# Patient Record
Sex: Male | Born: 1950
Health system: Southern US, Community
[De-identification: ages and names within clinical notes are randomized; demographics above are authoritative.]

## PROBLEM LIST (undated history)

## (undated) DIAGNOSIS — Z8719 Personal history of other diseases of the digestive system: Secondary | ICD-10-CM

## (undated) DIAGNOSIS — I10 Essential (primary) hypertension: Secondary | ICD-10-CM

## (undated) DIAGNOSIS — I259 Chronic ischemic heart disease, unspecified: Secondary | ICD-10-CM

## (undated) DIAGNOSIS — N049 Nephrotic syndrome with unspecified morphologic changes: Secondary | ICD-10-CM

## (undated) DIAGNOSIS — I252 Old myocardial infarction: Secondary | ICD-10-CM

## (undated) DIAGNOSIS — T4145XA Adverse effect of unspecified anesthetic, initial encounter: Secondary | ICD-10-CM

## (undated) DIAGNOSIS — G4730 Sleep apnea, unspecified: Secondary | ICD-10-CM

## (undated) DIAGNOSIS — I2510 Atherosclerotic heart disease of native coronary artery without angina pectoris: Principal | ICD-10-CM

## (undated) DIAGNOSIS — T8859XA Other complications of anesthesia, initial encounter: Secondary | ICD-10-CM

## (undated) DIAGNOSIS — K219 Gastro-esophageal reflux disease without esophagitis: Secondary | ICD-10-CM

## (undated) DIAGNOSIS — E7800 Pure hypercholesterolemia, unspecified: Secondary | ICD-10-CM

## (undated) DIAGNOSIS — E119 Type 2 diabetes mellitus without complications: Secondary | ICD-10-CM

## (undated) DIAGNOSIS — I219 Acute myocardial infarction, unspecified: Secondary | ICD-10-CM

## (undated) HISTORY — DX: Essential (primary) hypertension: I10

## (undated) HISTORY — DX: Chronic ischemic heart disease, unspecified: I25.9

## (undated) HISTORY — PX: BACK SURGERY: SHX140

## (undated) HISTORY — PX: EYE SURGERY: SHX253

## (undated) HISTORY — DX: Pure hypercholesterolemia, unspecified: E78.00

## (undated) HISTORY — DX: Atherosclerotic heart disease of native coronary artery without angina pectoris: I25.10

## (undated) HISTORY — DX: Old myocardial infarction: I25.2

---
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
## 1989-07-25 HISTORY — PX: LUMBAR DISC SURGERY: SHX700

## 2001-02-20 ENCOUNTER — Ambulatory Visit (HOSPITAL_BASED_OUTPATIENT_CLINIC_OR_DEPARTMENT_OTHER): Admission: RE | Admit: 2001-02-20 | Discharge: 2001-02-20 | Payer: Self-pay | Admitting: Otolaryngology

## 2001-02-20 ENCOUNTER — Encounter (INDEPENDENT_AMBULATORY_CARE_PROVIDER_SITE_OTHER): Payer: Self-pay | Admitting: *Deleted

## 2001-02-20 HISTORY — PX: NASAL SEPTUM SURGERY: SHX37

## 2002-07-25 DIAGNOSIS — I219 Acute myocardial infarction, unspecified: Secondary | ICD-10-CM

## 2002-07-25 HISTORY — DX: Acute myocardial infarction, unspecified: I21.9

## 2002-09-25 ENCOUNTER — Encounter: Payer: Self-pay | Admitting: Emergency Medicine

## 2002-09-25 ENCOUNTER — Inpatient Hospital Stay (HOSPITAL_COMMUNITY): Admission: EM | Admit: 2002-09-25 | Discharge: 2002-09-28 | Payer: Self-pay | Admitting: Emergency Medicine

## 2002-09-25 HISTORY — PX: CORONARY ANGIOPLASTY WITH STENT PLACEMENT: SHX49

## 2002-11-11 ENCOUNTER — Emergency Department (HOSPITAL_COMMUNITY): Admission: EM | Admit: 2002-11-11 | Discharge: 2002-11-11 | Payer: Self-pay

## 2003-02-28 ENCOUNTER — Ambulatory Visit (HOSPITAL_COMMUNITY): Admission: RE | Admit: 2003-02-28 | Discharge: 2003-02-28 | Payer: Self-pay | Admitting: *Deleted

## 2004-12-13 ENCOUNTER — Ambulatory Visit: Payer: Self-pay | Admitting: Cardiovascular Disease

## 2005-07-21 ENCOUNTER — Ambulatory Visit: Payer: Self-pay | Admitting: *Deleted

## 2005-08-01 ENCOUNTER — Ambulatory Visit: Payer: Self-pay | Admitting: *Deleted

## 2010-06-24 ENCOUNTER — Ambulatory Visit: Payer: Self-pay | Admitting: Cardiology

## 2010-11-02 ENCOUNTER — Ambulatory Visit: Payer: Self-pay | Admitting: Cardiology

## 2010-11-15 ENCOUNTER — Other Ambulatory Visit: Payer: Self-pay | Admitting: *Deleted

## 2010-11-15 DIAGNOSIS — E7800 Pure hypercholesterolemia, unspecified: Secondary | ICD-10-CM

## 2010-11-16 ENCOUNTER — Ambulatory Visit: Payer: Self-pay | Admitting: Cardiology

## 2010-11-19 ENCOUNTER — Encounter: Payer: Self-pay | Admitting: *Deleted

## 2010-11-19 DIAGNOSIS — I2510 Atherosclerotic heart disease of native coronary artery without angina pectoris: Secondary | ICD-10-CM

## 2010-11-19 DIAGNOSIS — E7800 Pure hypercholesterolemia, unspecified: Secondary | ICD-10-CM

## 2010-11-19 DIAGNOSIS — E119 Type 2 diabetes mellitus without complications: Secondary | ICD-10-CM | POA: Insufficient documentation

## 2010-11-19 DIAGNOSIS — I10 Essential (primary) hypertension: Secondary | ICD-10-CM

## 2010-11-19 DIAGNOSIS — I259 Chronic ischemic heart disease, unspecified: Secondary | ICD-10-CM

## 2010-11-25 ENCOUNTER — Encounter: Payer: Self-pay | Admitting: Cardiology

## 2010-11-25 ENCOUNTER — Other Ambulatory Visit (INDEPENDENT_AMBULATORY_CARE_PROVIDER_SITE_OTHER): Payer: 59 | Admitting: Cardiology

## 2010-11-25 ENCOUNTER — Other Ambulatory Visit (INDEPENDENT_AMBULATORY_CARE_PROVIDER_SITE_OTHER): Payer: 59 | Admitting: *Deleted

## 2010-11-25 ENCOUNTER — Ambulatory Visit (INDEPENDENT_AMBULATORY_CARE_PROVIDER_SITE_OTHER): Payer: 59 | Admitting: Cardiology

## 2010-11-25 VITALS — BP 120/80 | HR 65 | Wt 176.0 lb

## 2010-11-25 DIAGNOSIS — E785 Hyperlipidemia, unspecified: Secondary | ICD-10-CM

## 2010-11-25 DIAGNOSIS — I10 Essential (primary) hypertension: Secondary | ICD-10-CM

## 2010-11-25 DIAGNOSIS — E7800 Pure hypercholesterolemia, unspecified: Secondary | ICD-10-CM

## 2010-11-25 DIAGNOSIS — I259 Chronic ischemic heart disease, unspecified: Secondary | ICD-10-CM

## 2010-11-25 DIAGNOSIS — I519 Heart disease, unspecified: Secondary | ICD-10-CM

## 2010-11-25 DIAGNOSIS — E119 Type 2 diabetes mellitus without complications: Secondary | ICD-10-CM

## 2010-11-25 LAB — HEPATIC FUNCTION PANEL
Albumin: 3.9 g/dL (ref 3.5–5.2)
Alkaline Phosphatase: 42 U/L (ref 39–117)
Bilirubin, Direct: 0 mg/dL (ref 0.0–0.3)

## 2010-11-25 LAB — BASIC METABOLIC PANEL
Calcium: 9.2 mg/dL (ref 8.4–10.5)
Creatinine, Ser: 1 mg/dL (ref 0.4–1.5)
GFR: 84.28 mL/min (ref >60.00)
Glucose, Bld: 169 mg/dL — ABNORMAL HIGH (ref 70–99)
Sodium: 134 mEq/L — ABNORMAL LOW (ref 135–145)

## 2010-11-25 LAB — LIPID PANEL
HDL: 41.2 mg/dL (ref >39.00)
Triglycerides: 368 mg/dL — ABNORMAL HIGH (ref 0.0–149.0)
VLDL: 73.6 mg/dL — ABNORMAL HIGH (ref 0.0–40.0)

## 2010-11-25 NOTE — Progress Notes (Signed)
Eric Ball Date of Birth:  1951-05-29 [facility] / [facility] [address]   [address] [place], [place]  [address] [phone]           Fax   [phone]  History of Present Illness: This pleasant 60 year old gentleman is seen for a four-month followup office visit.  He has a history of coronary disease hypertension and diabetes and hypercholesterolemia.  He had an acute myocardial infarction in 2004 with a stent placed at that time.  He has not been expressing any recurrent chest pain.  His last nuclear stress test was 10/01/07 at which time he went 8 minutes on the Bruce protocol treadmill and is nuclear stress test showed no evidence of ischemia or scar and his ejection fraction was 70%.  The patient has not been getting any regular exercise.  Previously [facility] a member of [place] [employer] But has not been going to the [employer] Recently.  Current Outpatient Prescriptions  Medication Sig Dispense Refill  . aspirin 81 MG tablet Take 81 mg by mouth daily.        . carvedilol (COREG) 12.5 MG tablet Take 12.5 mg by mouth 2 (two) times daily.        Marland Kitchen ezetimibe (ZETIA) 10 MG tablet Take 10 mg by mouth daily.        . metFORMIN (GLUMETZA) 1000 MG (MOD) 24 hr tablet Take 1,000 mg by mouth daily with breakfast.        . omega-3 acid ethyl esters (LOVAZA) 1 G capsule Take 2 g by mouth 2 (two) times daily.        . Omeprazole Magnesium (PRILOSEC OTC PO) Take 1 capsule by mouth daily. Taking  Every other day      . valsartan (DIOVAN) 160 MG tablet Take 160 mg by mouth daily.          Allergies  Allergen Reactions  . Beta Adrenergic Blockers     Patient Active Problem List  Diagnoses  . Coronary artery disease  . Diabetes mellitus  . Hypercholesterolemia  . Ischemic heart disease  . Hypertension    History  Smoking status  . Never Smoker   Smokeless tobacco  . Not on file    History  Alcohol Use No    Family History  Problem Relation Age of Onset  . Coronary artery disease  Father     with multiple angioplasties  . Hypertension      runs in the family    Review of Systems: Constitutional: no fever chills diaphoresis or fatigue or change in weight.  Head and neck: no hearing loss, no epistaxis, no photophobia or visual disturbance. Respiratory: No cough, shortness of breath or wheezing. Cardiovascular: No chest pain peripheral edema, palpitations. Gastrointestinal: No abdominal distention, no abdominal pain, no change in bowel habits hematochezia or melena. Genitourinary: No dysuria, no frequency, no urgency, no nocturia. Musculoskeletal:No arthralgias, no back pain, no gait disturbance or myalgias. Neurological: No dizziness, no headaches, no numbness, no seizures, no syncope, no weakness, no tremors. Hematologic: No lymphadenopathy, no easy bruising. Psychiatric: No confusion, no hallucinations, no sleep disturbance.    Physical Exam: Filed Vitals:   11/25/10 0925  BP: 120/80  Pulse: 65  The general appearance reveals a well-developed well-nourished gentleman in no distress.Pupils equal and reactive.   Extraocular Movements are full.  There is no scleral icterus.  The mouth and pharynx are normal.  The neck is supple.  The carotids reveal no bruits.  The jugular  venous pressure is normal.  The thyroid is not enlarged.  There is no lymphadenopathy.The chest is clear to percussion and auscultation. There are no rales or rhonchi. Expansion of the chest is symmetrical.The precordium is quiet.  The first heart sound is normal.  The second heart sound is physiologically split.  There is no murmur gallop rub or click.  There is no abnormal lift or heave.The abdomen is soft and nontender. Bowel sounds are normal. The liver and spleen are not enlarged. There Are no abdominal masses. There are no bruits.The pedal pulses are good.  There is no phlebitis or edema.  There is no cyanosis or clubbing.Strength is normal and symmetrical in all extremities.  There is no  lateralizing weakness.  There are no sensory deficits.The skin is warm and dry.  There is no rash.No muscle weakness or atrophy.  No muscle tenderness.  EKG today shows normal sinus rhythm at 65 per minute and no ischemic changes and the tracing is within normal limits.  Assessment / Plan: Continue on same medication.  Work harder on regular exercise.  Try to eat 3 balanced meals a day and not skip breakfast to help his diabetes better.  Recheck in 4 months for followup office visit including glycohemoglobin

## 2010-11-25 NOTE — Assessment & Plan Note (Signed)
The patient has not been on any recurrent chest pain or angina.  His electrocardiogram today shows normal sinus rhythm and is within normal limits.

## 2010-11-25 NOTE — Assessment & Plan Note (Signed)
The patient is intolerant to all of the statins.  He is able to take Lovaza and Zetia

## 2010-11-25 NOTE — Assessment & Plan Note (Signed)
The patient has gained 1 pound since last visit.  However recently he came back from a cruise to the [place] where he put on some weight.  He has not been having any hypoglycemic episodes.  He's frequently skips breakfast.

## 2010-11-25 NOTE — Assessment & Plan Note (Signed)
The patient has a past history of essential hypertension.  He is on Diovan 160 mg daily and carvedilol 12.5 mg twice a day.  He has not been expressing any dizzy spells or syncope.  He does complain of fatigue but has not been getting any regular exercise.

## 2010-11-26 ENCOUNTER — Telehealth: Payer: Self-pay | Admitting: *Deleted

## 2010-11-26 NOTE — Telephone Encounter (Signed)
Adv pt of lab work

## 2010-12-10 NOTE — Discharge Summary (Signed)
NAME:  Eric Ball, Eric Ball NO.:  [id number]   MEDICAL RECORD NO.:  [id number]                   PATIENT TYPE:  INP   LOCATION:  2030                                 FACILITY:  [facility]   PHYSICIAN:  Dian Queen, P.A. [facility]               DATE OF BIRTH:  [birth year]   DATE OF ADMISSION:  09/25/2002  DATE OF DISCHARGE:                           DISCHARGE SUMMARY - REFERRING   HISTORY OF PRESENT ILLNESS:  The patient is a 60 year old white gentleman  with history of hypertension and hyperlipidemia, who is intolerant to  Lipitor and who presented with several day history of intermittent chest  discomfort initially associated with mild exertion. Symptoms seemed to wax  and wane and it was felt that this perhaps might be reflux. However, with  increasing activity, he had progressive symptoms and ultimately was referred  to [place] for further evaluation and therapy. He does have a significant family  history. His father had coronary artery disease with multiple interventions.   LABORATORY DATA:  Hemoglobin and hematocrit 13.2 and 37.8, white count  11,300. Electrolytes and renal function totally within normal limits. CK  peaked at  264 with MB of 26.7 and troponin of 2.78. Cholesterol was 341  with triglycerides of 433, HDL of 52 and LDL was incalculable because of the  high triglycerides. A repeat lipid profile was done which revealed total  cholesterol of 264 with triglyceride of 323 and an LDL of 159 with an HDL of  40.   HOSPITAL COURSE:  The patient was ruled in with elevated enzymes as above.  He was cathed on the same day of admission by Dr. Chales Abrahams for severe ongoing  chest pain and found to have a 99% AV circumflex which was dilated and  stented without complications using a 3.0 x 16 Express II Stent. He did have  some scattered 30% lesions in his LAD and right coronary systems. Ejection  fraction was slightly reduced at 45% with some lateral wall hypokinesis.  Thereafter, the patient did well. He was begun on Zocor 40 mg each day as  well as beta blockade therapy. He had some oozing from his groin but  ultimately, this stabilized. At the time of discharge, he is fully  ambulatory and doing well.   FINAL DIAGNOSES:  1. Acute circumflex infarct, treated with urgent intervention as above.  2. Hyperlipidemia.  3. History of known intolerance to Lipitor.  4. Controlled hypertension.  5. Family history of coronary disease.    DISPOSITION:  The patient is discharged to home on Zocor 40 mg each day,  Toprol XL 50 mg each day, Altace 5 mg each day, aspirin one daily, Nexium as  needed, Nitroglycerin as needed, and Plavix 75 mg by mouth each day.   FOLLOW UP:  With Dr. Dorethea Clan in [place].  Dian Queen, P.A. [facility]    BY/MEDQ  D:  09/28/2002  T:  09/29/2002  Job:  [id number]   cc:   Madelin Rear. Sherwood Gambler, M.D.  [address]  [place]  [place] [address]  Fax: [phone]   Vida Roller, M.D.  Fax: [phone]

## 2010-12-10 NOTE — Procedures (Signed)
   NAME:  Eric Ball, Eric Ball NO.:  [id number]   MEDICAL RECORD NO.:  [id number]                   PATIENT TYPE:  OUT   LOCATION:  RAD                                  FACILITY:  [facility]   PHYSICIAN:  Vida Roller, M.D.                DATE OF BIRTH:  [birth year]   DATE OF PROCEDURE:  02/28/2003  DATE OF DISCHARGE:                                  ECHOCARDIOGRAM   REFERRING PHYSICIAN:  Madelin Rear. Sherwood Gambler, M.D.   PROCEDURE:  Echocardiogram.   TAPE:  #[id number], tape count is 1605 through 1993.   REASON FOR PROCEDURE:  This is a 60 year old man with myocardial infarction  in March.   TECHNICAL QUALITY OF STUDY:  Limited.   M-MODE TRACINGS:  1. The aorta is 33 mm.  2. The left atrium is 37 mm.  3. The septum is 13 mm.  4. The posterior wall is 10 mm.  5. The left ventricular diastolic dimension is 35 mm.  6. The left ventricular systolic dimension is 26 mm.   2-D AND DOPPLER IMAGING:  1. The left ventricle is normal size with preserved left ventricular     ejection fraction estimated at 50%-55%.  There is inferolateral     hypokinesis with the remainder of the walls moving normally.  Diastolic     function was not assessed.  2. The right ventricle is normal size with normal systolic function.  3. Both atria appear to be normal size.  There is no evidence of an     atrioseptal defect.  4. The aortic valve is mildly sclerotic with no evidence of stenosis or     regurgitation.  5. The mitral valve is morphologically unremarkable with trace to mild     insufficiency.  No stenosis is seen.  6. The tricuspid valve is morphologically unremarkable with trace to mild     insufficiency.  No stenosis is seen.  7. The pulmonic valve was not well seen.  8. The ascending aorta appears to be mildly enlarged although not well     visualized in this view.  9. The inferior vena cava is normal size.  10.      The pericardial structures are normal.                              Vida Roller, M.D.   JH/MEDQ  D:  02/28/2003  T:  02/28/2003  Job:  [id number]

## 2010-12-10 NOTE — Cardiovascular Report (Signed)
NAME:  Eric Ball, Eric Ball NO.:  [id number]   MEDICAL RECORD NO.:  [id number]                   PATIENT TYPE:  INP   LOCATION:  1823                                 FACILITY:  [facility]   PHYSICIAN:  Veneda Melter, M.D. [facility]               DATE OF BIRTH:  1950-11-25   DATE OF PROCEDURE:  09/25/2002  DATE OF DISCHARGE:                              CARDIAC CATHETERIZATION   PROCEDURES PERFORMED:  1. Left heart catheterization.  2. Left ventriculogram.  3. Selective coronary angiography.  4. Primary stent placement, mid arteriovenous circumflex.  5. Perclose, right femoral artery.   DIAGNOSES:  1. Severe single-vessel coronary artery disease.  2. Mild left ventricular systolic dysfunction.  3. Acute lateral wall myocardial infarction.   HISTORY:  The patient is a 60 year old gentleman with no prior cardiac  history who presents with three days of intermittent substernal chest  discomfort.  This morning, the patient had severe onset of pain that was  unrelenting and he presented to the emergency room where he was found to  have transient ST elevation in the lateral leads.  He is transferred to the  catheterization lab for urgent angiography.   TECHNIQUE:  Informed consent was obtained, patient brought to the cardiac  catheterization lab, and a 7 French arterial and 6 French venous sheath was  placed in the right groin using the modified Seldinger technique.  A 6  [place] and JR4 catheter was then used to engage the left and right  coronary arteries and selective angiography performed in various projections  using manual injections of contrast.  A 6 French pigtail catheter was then  advanced to the left ventricle and left ventriculogram performed using power  injections of contrast in two views.   FINDINGS:  Initial findings are as follows:  1. Left main trunk:  Large caliber vessel with mild irregularities.  2. LAD:  This is a medium caliber vessel that  provides a bifurcating first     diagonal branch in the mid section.  The LAD has mild diffuse disease of     30% in the proximal segment and tubular narrowings of 40-50% in the mid     section.  The diagonal branch has moderate diffuse disease of 50-60%.  3. Left circumflex artery:  This is a medium caliber vessel that provides     two marginal branches in the mid section.  The left circumflex system has     narrowings of 30% in the proximal segment.  In the mid AV circumflex     there is a high-grade narrowing of 99% prior to the takeoff of the two     marginal branches.  There is TIMI-2 flow distally.  4. RCA:  Dominant.  This is a large caliber vessel that provides the     posterior descending artery and a posterior ventricular branch in the     terminal  segment.  The right coronary has mild diffuse disease of 30% in     the mid section.   LEFT VENTRICULOGRAM:  Normal end-systolic and end-diastolic dimensions.  Overall, left ventricular function appears mildly impaired with an ejection  fraction of approximately 45%.  There is severe hypokinesis of the lateral  and atrial lateral walls.  No mitral regurgitation is noted.  LV pressure is  130/10, aortic is 130/90, LVEDP equals 30.   With these findings, we elected to proceed with percutaneous intervention to  the patent circumflex.  The patient was given 600 mg of Plavix orally, 5000  units of heparin intravenously and ReoPro on a weight-adjusted basis to  maintain ACT of approximately 300 seconds. A  7 [place] guide catheter  was then used to engage the left coronary artery and a 0.014 extra-support  wire advanced to the distal section of the second marginal branch.  A 3.0 x  16 mm Express II stent was introduced, carefully positioned in the mid AV  circumflex and deployed at 16 atmospheres for 60 seconds.  Angiography was  then performed after the administration of intracoronary nitroglycerin  showing excellent result with no  residual stenosis, full coverage of lesion  and TIMI-3 flow through the left circumflex system.  Angiography was then  performed in various projections confirming these findings.  The patient had  complete resolution obtained at the end of the intervention.  The guide  catheter was then removed and  Perclose suture closure device deployed to  the right femoral artery where the sheath was secured in position.  The  patient was transferred to CCU in stable condition.   FINAL RESULTS:  Successful stent placement to the mid arteriovenous  circumflex with reduction of 99% narrowing to 0% with placement of a 3.0 x  16 mm Express II stent with improvement of TIMI grade 2 to TIMI grade 3  flow.                                                Veneda Melter, M.D. [facility]    NG/MEDQ  D:  09/25/2002  T:  09/25/2002  Job:  [id number]   cc:   Madelin Rear. Sherwood Gambler, M.D.  [address]  [place]  [place] [address]  Fax: [phone]   Vida Roller, M.D.  Fax: [phone]

## 2010-12-10 NOTE — Consult Note (Signed)
NAME:  Eric Ball, Eric Ball NO.:  [id number]   MEDICAL RECORD NO.:  [id number]                   PATIENT TYPE:  EMS   LOCATION:  ED                                   FACILITY[facility]   PHYSICIAN:  Veverly Fells. Arletha Grippe, M.D.             DATE OF BIRTH:  Aug 12, 1950   DATE OF CONSULTATION:  11/11/2002  DATE OF DISCHARGE:                                   CONSULTATION   REASON FOR CONSULTATION:  I was asked to see this 60 year old white male  with a history of having a myocardial infarction earlier this year,  requiring a stent placement.  He has been on Plavix and aspirin.  He has had  right-sided epistaxis on and off for the past week or two.  He was evaluated  by Dr. Hermelinda Medicus this afternoon in the office who did some silver  nitrate cautery and initial Merisel packing of the right nose, but he called  me about 6 p.m. this evening with acute right-sided epistaxis.  He was  instructed to go to the emergency room at [facility], and he is  here for evaluation.  Besides having recurrent epistaxis, he has been on  Plavix and aspirin and other supportive medications after his angioplasty.   PHYSICAL EXAMINATION:  GENERAL:  A well-nourished, well-developed, 51-year-  old white male, in no acute distress.  HEENT:  Head normocephalic, atraumatic.  Eyes:  Pupils equal, round,  reactive to light and accommodation.  Extraocular muscles intact.  Facial  nerve intact bilaterally.  Oral cavity:  Oral mucosa, teeth and gums without  lesions.  The posterior pharynx at this time is dry.  Nasal examination  shows some packing involving the right nasal chamber.  This was removed and  topical 4% cocaine on cotton pledgets was placed within the right nasal  chamber, which were left in place for approximately 5-10 minutes and then  removed.  A large 10 cm Merisel pack was placed soaked in bacitracin  ointment in the right nasal chamber without difficulty.  It was  then  inflated with some saline without difficulty, with good control of the  bleeding.  A re-inspection showed no signs of active bleeding anteriorly or  posteriorly.  He tolerated the packing well, without problems.  NECK:  Supple, without mass, tenderness, or thyromegaly.   ASSESSMENT:  Acute right-sided epistaxis due to aspirin and Plavix use after  a myocardial infarction, controlled with a 10 cm Merisel pack in the  emergency department at [facility].   PLAN:  The patient will be discharged to home on Keflex 500 mg p.o. t.i.d.  for 10 days, and Vicodin #30, with one refill, one to two tab p.o. q.4h.  p.r.n. pain.  He is to have light activity and no heavy lifting or nose  blowing for two  weeks after surgery, and he is to instill salt water nasal spray  on the pack  four to six times per day.  He is to call Dr. Allayne Stack office in the  morning on November 12, 2002, for a return appointment in approximately five to  seven days for packing removal.                                                Veverly Fells. Arletha Grippe, M.D.    MDR/MEDQ  D:  11/11/2002  T:  11/12/2002  Job:  [id number]   cc:   Hermelinda Medicus, M.D.  [address]  [place] [address]  Fax: [phone]

## 2010-12-10 NOTE — Op Note (Signed)
[place]  Patient:    Eric Ball, Eric Ball                      MRN: [id number] Adm. Date:  [id number] Attending:  Waldon Merl CC:         [facility], [place], [place]   Operative Report  PREOPERATIVE DIAGNOSES:  Septal deviation and turbinate hypertrophy, with history of epistaxis.  POSTOPERATIVE DIAGNOSES:  Septal deviation and turbinate hypertrophy, with history of epistaxis.  PROCEDURES:  Septal reconstruction and turbinate reduction.  SURGEON:  Keturah Barre, M.D.  ANESTHESIA:  Local with general standby with Janetta Hora. Gelene Mink, M.D.  CLINICAL NOTE:  This patient is a 60 year old male who has had a history of epistaxis plus nasal obstruction and now enters for a septal reconstruction, turbinate reduction in an effort to correct the epistaxis problem as well as to gain some reasonable breathing.  He has had nasal trauma in the past and has suffered with nasal obstruction since this time.  DESCRIPTION OF PROCEDURE:  Patient placed in supine position under local anesthesia, was prepped and draped in the appropriate manner with Hibiclens x 3 and the usual head drape.  Xylocaine 1% with epinephrine and topical cocaine 200 mg were used.  A hemitransfixion incision was made on the right side, carried around the columella to the left side, and the mucoperichondrium was elevated back along to the ethmoid septal deviation and also to the floor of the nose.  A strip of quadrilateral cartilage was taken from the floor of the nose, the septum was brought back.  The whole columella, which was pushed over to the left, was brought back to the midline, and then the ethmoid septum was approached using the open and closed Jansen-Middletons.  Once this was removed, then the vomerine spur and septal deviation was corrected by using the Takahashi forceps to remove the septal spur.  The remainder of the septum was brought back and set on  the mid-premaxillary crest.  A spur from the premaxillary crest was also removed using the 4 mm chisel.  Closure of this septal mucous membrane was with 5-0 plain catgut.  A septal suture using 4-0 plain catgut x 2 through-and-through suture as a mattress suture was used, and then PDS 3-0 was used to stabilize the columella.  Intranasal dressing was placed.  The patient tolerated the procedure very well and is doing well postop. DD:  02/20/01 TD:  02/20/01 Job: [id number]

## 2010-12-10 NOTE — H&P (Signed)
NAME:  SHAWNDELL, SCHILLACI NO.:  [id number]   MEDICAL RECORD NO.:  [id number]                   PATIENT TYPE:  INP   LOCATION:  1823                                 FACILITY:  [facility]   PHYSICIAN:  Vida Roller, M.D.                DATE OF BIRTH:  1950/12/14   DATE OF ADMISSION:  09/25/2002  DATE OF DISCHARGE:                                HISTORY & PHYSICAL   REFERRING PHYSICIAN:  Madelin Rear. Sherwood Gambler, M.D.   ADMISSION DIAGNOSES:  1. Acute ST segment elevation myocardial infarction.  2. Hypertension.  3. Gastroesophageal reflux disease.  4. Hyperlipidemia.   HISTORY OF PRESENT ILLNESS:  Mr. Villarruel is a 60 year old white male with  known hypertension and hyperlipidemia followed by the [facility]  in [place], [place], who has been having discomfort in his chest for  the past three to four days.  He describes the pain initially as mildly  exertional with some element of rest discomfort, waxing and waning,  characteristically more like his gastroesophageal reflux pain.  However,  with increasing activity, he had escalating discomfort and called his  primary care Katisha Shimizu to ask for medication.  He was advised to stop his  blood pressure medication which was Verelan as this is known to worsen his  gastroesophageal reflux disease.  However, upon stopping his blood pressure  medication, he began having more pain in his chest until early this morning  he had the onset of severe retrosternal pain radiating to his neck and up to  his left shoulder associated with diaphoresis and shortness of breath. This  worsened over the course of the last two to three hours to the point where  he presented to the emergency department at [facility]  where he  was found to have ST segment elevation in his inferior leads with reciprocal  changes in the anterior leads associated with severe hypertension, blood  pressures in the 190s/100 range.  He was  initially treated with sublingual  nitroglycerin followed by IV nitroglycerin and IV beta blocker with  improvement of his blood pressure and his chest discomfort and resolution of  his ST segment elevation but not the reciprocal changes seen in the  anterolateral leads.  He was taken directly to the cardiac catheterization  laboratory where he underwent left heart catheterization.   PAST MEDICAL HISTORY:  Significant for hypertension, longstanding as well as  diet controlled glucose intolerance.  He does not have a diagnosis of frank  diabetes.  He also has hypertension.   MEDICATIONS ON ADMISSION:  1. Protonix 40 mg a day.  2. Verelan at an unknown dose.  3. An aspirin a day.   PAST SURGICAL HISTORY:  Significant for septoplasty and a lower lumbar back  surgery both of which were done at [facility] approximately five  to six years ago.  He had general anesthesia for both  and had no problems.   ALLERGIES:  No known drug allergies.   SOCIAL HISTORY:  He does not smoke.   FAMILY HISTORY:  He has a significant past family history.  His father has  coronary artery disease, status post multiple angioplasties.  His mother is  alive and well. He has two siblings both of whom are alive and well and  three children who are alive and well.  His wife is also alive and well.  Hypertension is the only thing that runs in his family.   REVIEW OF SYSTEMS:  Noncontributory.   PHYSICAL EXAMINATION:  GENERAL:  Well-developed, well-nourished white male  in no apparent distress.  Alert and oriented x 4.  VITAL SIGNS:  His blood pressure when I saw him was 180/100.  He was having  active chest discomfort with shortness of breath.  Respiratory rate was 25  and his pulse rate was 80 in sinus.  HEENT:  Unremarkable.  NECK:  Supple with no jugular venous distension or carotid bruits.  Thyroid  is normal size in the midline.  CHEST:  Clear to auscultation.  HEART:  Nondisplaced point of  maximal impulse with no lifts or thrills.  First and second heart sounds are normal.  There is no third heart sound but  there is an easily heard fourth heart sound.  No murmurs are noted.  ABDOMEN:  Soft, nontender with normoactive bowel sounds.  EXTREMITIES:  Without clubbing, cyanosis or edema.  His pulses are full with  no bruits and equal bilaterally in both upper extremities as well as his  lower extremities.  Blood pressure was not documented in both arms but  appears to be elevated in his left arm.  NEUROLOGIC:  Nonfocal.   LABORATORY DATA:  His electrocardiogram reveals initially sinus rhythm at a  rate of approximately 68 with evidence of ST segment elevation approximately  1 mm in leads V3 and aVF with inverted with T waves and 2 mm of ST segment  depression in leads V2, V3 and V4.  Subsequent EKG done approximately 10  minutes later shows ST segment elevation approximately 2-3 mm in V5 and V6  with worsening ST segment elevation in the inferior leads.  Once he was  treated with nitroglycerin, his electrocardiogram improved to show only the  inverted T waves as previously described and mild ST segment depression in  the anterolateral leads.   ASSESSMENT:  1. An acute ST segment elevation myocardial infarction with associated chest     discomfort.  2. Hypertension.  3. Gastroesophageal reflux disease.  4. Elevated cholesterol by history.    PLAN:  Our plan is to take him directly tot cardiac catheterization  laboratory where we can assess his coronary anatomy and potentially perform  percutaneous coronary intervention.  We will treat his hypertension  aggressively with medical therapy to include beta blockers and likely ACE  inhibitors and treat his hypercholesterolemia aggressively as per guide  lines for secondary prophylaxis.                                               Vida Roller, M.D.   JH/MEDQ  D:  09/25/2002  T:  09/25/2002  Job:  [id number]   cc:    Madelin Rear. Sherwood Gambler, M.D.  [address]  [place]  [place] [address]  Fax: [phone]

## 2011-01-25 ENCOUNTER — Other Ambulatory Visit: Payer: Self-pay | Admitting: Cardiology

## 2011-01-25 MED ORDER — NITROGLYCERIN 0.4 MG SL SUBL: 0.4000 mg | SUBLINGUAL_TABLET | SUBLINGUAL | Status: DC | PRN

## 2011-01-25 NOTE — Telephone Encounter (Signed)
Spoke with patients wife and she stated he had chest discomfort after getting too hot yesterday.  Stated several times he was ok, just needed to have NTG on hand, was out.  Advised if he had any more pains to call and get an appointment.  Stated she would and she does ask him regularly about pains, but that he was fine.  rx sent to [facility]

## 2011-01-25 NOTE — Telephone Encounter (Signed)
Wanted to see if she could get a prescritption for nitroglyceride refilled at the [facility] in [facility] [phone], for her husband. Please call back. I have pulled his chart.

## 2011-01-27 ENCOUNTER — Other Ambulatory Visit: Payer: Self-pay | Admitting: *Deleted

## 2011-01-27 ENCOUNTER — Encounter: Payer: Self-pay | Admitting: Cardiology

## 2011-01-27 DIAGNOSIS — I119 Hypertensive heart disease without heart failure: Secondary | ICD-10-CM

## 2011-01-27 MED ORDER — VALSARTAN 160 MG PO TABS: 160.0000 mg | ORAL_TABLET | Freq: Every day | ORAL | Status: DC

## 2011-01-27 NOTE — Telephone Encounter (Signed)
Refilled meds per fax request.  

## 2011-04-05 ENCOUNTER — Other Ambulatory Visit: Payer: Self-pay | Admitting: Cardiology

## 2011-04-05 DIAGNOSIS — E7800 Pure hypercholesterolemia, unspecified: Secondary | ICD-10-CM

## 2011-04-05 DIAGNOSIS — I2510 Atherosclerotic heart disease of native coronary artery without angina pectoris: Secondary | ICD-10-CM

## 2011-04-06 ENCOUNTER — Ambulatory Visit (INDEPENDENT_AMBULATORY_CARE_PROVIDER_SITE_OTHER): Payer: 59 | Admitting: Cardiology

## 2011-04-06 ENCOUNTER — Other Ambulatory Visit (INDEPENDENT_AMBULATORY_CARE_PROVIDER_SITE_OTHER): Payer: 59 | Admitting: *Deleted

## 2011-04-06 ENCOUNTER — Encounter: Payer: Self-pay | Admitting: Cardiology

## 2011-04-06 ENCOUNTER — Other Ambulatory Visit: Payer: Self-pay | Admitting: Cardiology

## 2011-04-06 VITALS — BP 132/80 | HR 80 | Wt 176.0 lb

## 2011-04-06 DIAGNOSIS — E7800 Pure hypercholesterolemia, unspecified: Secondary | ICD-10-CM

## 2011-04-06 DIAGNOSIS — I2510 Atherosclerotic heart disease of native coronary artery without angina pectoris: Secondary | ICD-10-CM

## 2011-04-06 DIAGNOSIS — I119 Hypertensive heart disease without heart failure: Secondary | ICD-10-CM

## 2011-04-06 DIAGNOSIS — E119 Type 2 diabetes mellitus without complications: Secondary | ICD-10-CM

## 2011-04-06 DIAGNOSIS — I10 Essential (primary) hypertension: Secondary | ICD-10-CM

## 2011-04-06 DIAGNOSIS — I259 Chronic ischemic heart disease, unspecified: Secondary | ICD-10-CM

## 2011-04-06 LAB — BASIC METABOLIC PANEL
BUN: 17 mg/dL (ref 6–23)
Chloride: 99 mEq/L (ref 96–112)
GFR: 82.22 mL/min (ref >60.00)
Potassium: 5.4 mEq/L — ABNORMAL HIGH (ref 3.5–5.1)
Sodium: 136 mEq/L (ref 135–145)

## 2011-04-06 LAB — HEMOGLOBIN A1C: Hgb A1c MFr Bld: 8.6 % — ABNORMAL HIGH (ref 4.6–6.5)

## 2011-04-06 LAB — LIPID PANEL
Cholesterol: 342 mg/dL — ABNORMAL HIGH (ref 0–200)
HDL: 45.4 mg/dL (ref >39.00)
Total CHOL/HDL Ratio: 8
Triglycerides: 324 mg/dL — ABNORMAL HIGH (ref 0.0–149.0)
VLDL: 64.8 mg/dL — ABNORMAL HIGH (ref 0.0–40.0)

## 2011-04-06 LAB — LDL CHOLESTEROL, DIRECT: Direct LDL: 248.9 mg/dL

## 2011-04-06 LAB — HEPATIC FUNCTION PANEL
Albumin: 4.4 g/dL (ref 3.5–5.2)
Alkaline Phosphatase: 47 U/L (ref 39–117)
Total Protein: 7.4 g/dL (ref 6.0–8.3)

## 2011-04-06 NOTE — Assessment & Plan Note (Signed)
The patient has a history of significant hypercholesterolemia.  Unfortunately he is unable to take any of the statins because of side effects.  He is on Lovaza.

## 2011-04-06 NOTE — Assessment & Plan Note (Signed)
The patient has diabetes mellitus.  He is not expressing any hypoglycemic episodes.  He is very little breakfast and so he does not take his metformin until later in the day to avoid hypoglycemia.

## 2011-04-06 NOTE — Assessment & Plan Note (Signed)
The patient is not having any headaches or dizziness referable to his blood pressure.  At times he will be a little lightheaded after he takes his Diovan and he is going to start taking it in the evening rather than in the morning and see if this works better for him

## 2011-04-06 NOTE — Progress Notes (Signed)
Eric Ball Date of Birth:  12/13/1950 [facility] [address]   [address] [place], [place]  [address] [phone]           Fax   [phone]  History of Present Illness: This pleasant 60 year old gentleman is seen for a scheduled 4 month followup office visit.  He has a history of coronary artery disease, hypertension, diabetes, and hypercholesterolemia.  He had an acute myocardial infarction in 2004 and had a stent placed at that time.  The patient has not been expressing any recurrent chest pain.  His last treadmill Cardiolite stress test was 10/01/07 at which time he had an ejection fraction of 70% and there was no evidence of ischemia or scar.  Current Outpatient Prescriptions  Medication Sig Dispense Refill  . aspirin 81 MG tablet Take 81 mg by mouth daily.        . carvedilol (COREG) 12.5 MG tablet Take 12.5 mg by mouth 2 (two) times daily with a meal.        . metFORMIN (GLUMETZA) 1000 MG (MOD) 24 hr tablet Take 1,000 mg by mouth daily with breakfast.        . nitroGLYCERIN (NITROSTAT) 0.4 MG SL tablet Place 1 tablet (0.4 mg total) under the tongue every 5 (five) minutes as needed for chest pain.  25 tablet  11  . omega-3 acid ethyl esters (LOVAZA) 1 G capsule Take 2 g by mouth 2 (two) times daily.        . Omeprazole Magnesium (PRILOSEC OTC PO) Take 1 capsule by mouth daily. Taking  Every other day      . valsartan (DIOVAN) 160 MG tablet Take 1 tablet (160 mg total) by mouth daily.  30 tablet  11    Allergies  Allergen Reactions  . Beta Adrenergic Blockers     Patient Active Problem List  Diagnoses  . Coronary artery disease  . Diabetes mellitus  . Hypercholesterolemia  . Ischemic heart disease  . Hypertension    History  Smoking status  . Never Smoker   Smokeless tobacco  . Not on file    History  Alcohol Use No    Family History  Problem Relation Age of Onset  . Coronary artery disease Father     with multiple  angioplasties  . Hypertension      runs in the family    Review of Systems: Constitutional: no fever chills diaphoresis or fatigue or change in weight.  Head and neck: no hearing loss, no epistaxis, no photophobia or visual disturbance. Respiratory: No cough, shortness of breath or wheezing. Cardiovascular: No chest pain peripheral edema, palpitations. Gastrointestinal: No abdominal distention, no abdominal pain, no change in bowel habits hematochezia or melena. Genitourinary: No dysuria, no frequency, no urgency, no nocturia. Musculoskeletal:No arthralgias, no back pain, no gait disturbance or myalgias. Neurological: No dizziness, no headaches, no numbness, no seizures, no syncope, no weakness, no tremors. Hematologic: No lymphadenopathy, no easy bruising. Psychiatric: No confusion, no hallucinations, no sleep disturbance.    Physical Exam: Filed Vitals:   04/06/11 0849  BP: 132/80  Pulse: 80  The general appearance reveals a well-developed well-nourished gentleman in no distress.The head and neck exam reveals pupils equal and reactive.  Extraocular movements are full.  There is no scleral icterus.  The mouth and pharynx are normal.  The neck is supple.  The carotids reveal no bruits.  The jugular venous pressure is normal.  The  thyroid is not enlarged.  There is no lymphadenopathy.  The chest is clear to percussion and auscultation.  There are no rales or rhonchi.  Expansion of the chest is symmetrical.  The precordium is quiet.  The first heart sound is normal.  The second heart sound is physiologically split.  There is no murmur gallop rub or click.  There is no abnormal lift or heave.  The abdomen is soft and nontender.  The bowel sounds are normal.  The liver and spleen are not enlarged.  There are no abdominal masses.  There are no abdominal bruits.  Extremities reveal good pedal pulses.  There is no phlebitis or edema.  There is no cyanosis or clubbing.  Strength is normal and  symmetrical in all extremities.  There is no lateralizing weakness.  There are no sensory deficits.  The skin is warm and dry.  There is no rash.    Assessment / Plan: Blood work today is pending.  Continue same medication.  Recheck in 4 months for followup office visit and fasting lab work.  Try to increase regular aerobic exercise and continue on strict diet

## 2011-04-06 NOTE — Assessment & Plan Note (Signed)
The patient has not been expressing any recurrent angina pectoris or symptoms of congestive heart failure

## 2011-04-07 ENCOUNTER — Telehealth: Payer: Self-pay | Admitting: *Deleted

## 2011-04-07 NOTE — Telephone Encounter (Signed)
Advised of labs 

## 2011-04-07 NOTE — Progress Notes (Signed)
Advised of labs 

## 2011-04-07 NOTE — Telephone Encounter (Signed)
Message copied by Burnell Blanks on Thu Apr 07, 2011 12:56 PM ------      Message from: Cassell Clement      Created: Wed Apr 06, 2011  9:29 PM       Please report.Electrolytes are satisfactory.  Kidney function is normal.  Blood sugar slightly higher at 183.A triglycerides are elevated at 324 and total cholesterol is elevated at 342.  Since he cannot tolerate any of the statins, needs to work harder on diet and regular exercise.2 elevated liver test remains slightly elevated.The hemoglobin A1c is elevated at 8.6 cm to be on a more strict diabetic regimen.Continue on same medication and work harder with diet and weight loss and exercise.

## 2011-08-09 ENCOUNTER — Encounter: Payer: Self-pay | Admitting: Cardiology

## 2011-08-09 ENCOUNTER — Ambulatory Visit (INDEPENDENT_AMBULATORY_CARE_PROVIDER_SITE_OTHER): Payer: 59 | Admitting: Cardiology

## 2011-08-09 DIAGNOSIS — I2510 Atherosclerotic heart disease of native coronary artery without angina pectoris: Secondary | ICD-10-CM

## 2011-08-09 DIAGNOSIS — E7800 Pure hypercholesterolemia, unspecified: Secondary | ICD-10-CM

## 2011-08-09 DIAGNOSIS — I10 Essential (primary) hypertension: Secondary | ICD-10-CM

## 2011-08-09 DIAGNOSIS — I119 Hypertensive heart disease without heart failure: Secondary | ICD-10-CM

## 2011-08-09 DIAGNOSIS — E119 Type 2 diabetes mellitus without complications: Secondary | ICD-10-CM

## 2011-08-09 DIAGNOSIS — I259 Chronic ischemic heart disease, unspecified: Secondary | ICD-10-CM

## 2011-08-09 LAB — HEPATIC FUNCTION PANEL
ALT: 79 U/L — ABNORMAL HIGH (ref 0–53)
Alkaline Phosphatase: 46 U/L (ref 39–117)
Bilirubin, Direct: 0 mg/dL (ref 0.0–0.3)
Total Bilirubin: 0.6 mg/dL (ref 0.3–1.2)
Total Protein: 7.8 g/dL (ref 6.0–8.3)

## 2011-08-09 LAB — BASIC METABOLIC PANEL
BUN: 19 mg/dL (ref 6–23)
Chloride: 99 mEq/L (ref 96–112)
Creatinine, Ser: 1.2 mg/dL (ref 0.4–1.5)
GFR: 65.14 mL/min (ref >60.00)
Glucose, Bld: 193 mg/dL — ABNORMAL HIGH (ref 70–99)
Potassium: 4.8 mEq/L (ref 3.5–5.1)

## 2011-08-09 LAB — HEMOGLOBIN A1C: Hgb A1c MFr Bld: 8.8 % — ABNORMAL HIGH (ref 4.6–6.5)

## 2011-08-09 LAB — LIPID PANEL: HDL: 42.1 mg/dL (ref >39.00)

## 2011-08-09 NOTE — Assessment & Plan Note (Signed)
The patient denies any palpitations or dizzy spells or headaches.  Does have labile hypertension.  His blood pressure was high on arrival but improved when rechecked later during the exam.

## 2011-08-09 NOTE — Assessment & Plan Note (Signed)
The patient has not been having any recurrent chest pain.  His energy level is good.  Is not having any symptoms of congestive heart failure.

## 2011-08-09 NOTE — Assessment & Plan Note (Signed)
The patient denies any hypoglycemia.  He has nocturia x1.

## 2011-08-09 NOTE — Progress Notes (Signed)
Eric Ball Date of Birth:  01-03-1951 [facility] [address] [place], [place]  [address] [phone]         Fax   [phone]  History of Present Illness: This pleasant 61 year old gentleman is seen for a four-month visit.  He has a history of ischemic heart disease.  He had an acute myocardial infarction in 2004 at which time a stent was placed.  He has a history of diabetes hypercholesterolemia and high blood pressure.  His last nuclear stress test was March 2009 at which time there is no evidence of ischemia or scar and his ejection fraction was 70%.  Current Outpatient Prescriptions  Medication Sig Dispense Refill  . aspirin 81 MG tablet Take 81 mg by mouth daily.        . carvedilol (COREG) 12.5 MG tablet Take 12.5 mg by mouth 2 (two) times daily with a meal.        . metFORMIN (GLUMETZA) 1000 MG (MOD) 24 hr tablet Take 1,000 mg by mouth daily with breakfast.        . nitroGLYCERIN (NITROSTAT) 0.4 MG SL tablet Place 1 tablet (0.4 mg total) under the tongue every 5 (five) minutes as needed for chest pain.  25 tablet  11  . omega-3 acid ethyl esters (LOVAZA) 1 G capsule Take 2 g by mouth 2 (two) times daily.        . Omeprazole Magnesium (PRILOSEC OTC PO) Take 1 capsule by mouth daily. Taking  Every other day      . valsartan (DIOVAN) 160 MG tablet Take 1 tablet (160 mg total) by mouth daily.  30 tablet  11    Allergies  Allergen Reactions  . Beta Adrenergic Blockers     Patient Active Problem List  Diagnoses  . Coronary artery disease  . Diabetes mellitus  . Hypercholesterolemia  . Ischemic heart disease  . Hypertension    History  Smoking status  . Never Smoker   Smokeless tobacco  . Not on file    History  Alcohol Use No    Family History  Problem Relation Age of Onset  . Coronary artery disease Father     with multiple angioplasties  . Hypertension      runs in the family    Review of Systems: Constitutional: no fever  chills diaphoresis or fatigue or change in weight.  Head and neck: no hearing loss, no epistaxis, no photophobia or visual disturbance. Respiratory: No cough, shortness of breath or wheezing. Cardiovascular: No chest pain peripheral edema, palpitations. Gastrointestinal: No abdominal distention, no abdominal pain, no change in bowel habits hematochezia or melena. Genitourinary: No dysuria, no frequency, no urgency, no nocturia. Musculoskeletal:No arthralgias, no back pain, no gait disturbance or myalgias. Neurological: No dizziness, no headaches, no numbness, no seizures, no syncope, no weakness, no tremors. Hematologic: No lymphadenopathy, no easy bruising. Psychiatric: No confusion, no hallucinations, no sleep disturbance.    Physical Exam: Filed Vitals:   08/09/11 0927  BP: 126/90  Pulse:  76   Resp:   The general appearance reveals a well-developed gentleman in no distress.The head and neck exam reveals pupils equal and reactive.  Extraocular movements are full.  There is no scleral icterus.  The mouth and pharynx are normal.  The neck is supple.  The carotids reveal no bruits.  The jugular venous pressure is normal.  The  thyroid is not enlarged.  There is no lymphadenopathy.  The chest is clear to percussion and  auscultation.  There are no rales or rhonchi.  Expansion of the chest is symmetrical.  The precordium is quiet.  The first heart sound is normal.  The second heart sound is physiologically split.  There is no murmur gallop rub or click.  There is no abnormal lift or heave.  The abdomen is soft and nontender.  The bowel sounds are normal.  The liver and spleen are not enlarged.  There are no abdominal masses.  There are no abdominal bruits.  Extremities reveal good pedal pulses.  There is no phlebitis or edema.  There is no cyanosis or clubbing.  Strength is normal and symmetrical in all extremities.  There is no lateralizing weakness.  There are no sensory deficits.  The skin is warm  and dry.  There is no rash.     Assessment / Plan:  Continue same medication.  Recheck in 4 months for followup office visit EKG and fasting lab work.  He has never had the shingles vaccine and we gave him a prescription to get shingles vaccine at his pharmacy

## 2011-08-09 NOTE — Patient Instructions (Signed)
Will obtain labs today and call you with the results (lp/bmet/hfp/a1c)  Your physician recommends that you continue on your current medications as directed. Please refer to the Current Medication list given to you today.  Your physician wants you to follow-up in: 4 months You will receive a reminder letter in the mail two months in advance. If you don't receive a letter, please call our office to schedule the follow-up appointment.

## 2011-08-17 ENCOUNTER — Telehealth: Payer: Self-pay | Admitting: *Deleted

## 2011-08-17 NOTE — Telephone Encounter (Signed)
Advised of labs and medication change 

## 2011-08-17 NOTE — Telephone Encounter (Signed)
Message copied by Burnell Blanks on Wed Aug 17, 2011  2:24 PM ------      Message from: Cassell Clement      Created: Wed Aug 10, 2011 10:38 AM       Please report.  The cholesterol and triglycerides are higher.  The LDL is also still too high.  The blood sugar is higher and the hemoglobin A1c is up to 8.8.  I want him to increase his metformin to 1000 mg twice a day and be more careful with diet.  He needs to lose weight.  We may need to consider referral to a diabetes specialist if his sugars remain high.  We will want to recheck his hemoglobin A1c at his next visit.

## 2011-09-14 ENCOUNTER — Telehealth: Payer: Self-pay

## 2011-09-14 ENCOUNTER — Other Ambulatory Visit: Payer: Self-pay

## 2011-09-14 DIAGNOSIS — Z139 Encounter for screening, unspecified: Secondary | ICD-10-CM

## 2011-09-14 NOTE — Telephone Encounter (Signed)
OK to schedule.  Day of prep: metformin 500mg  bid.

## 2011-09-14 NOTE — Telephone Encounter (Signed)
Gastroenterology Pre-Procedure Form    Request Date: 09/14/2011          Requesting Physician: Dr. Sherwood Gambler     PATIENT INFORMATION:  Eric Ball is a 61 y.o., male (DOB=04-15-1951).  PROCEDURE: Procedure(s) requested: colonoscopy Procedure Reason: screening for colon cancer  PATIENT REVIEW QUESTIONS: The patient reports the following:   1. Diabetes Melitis: yes  2. Joint replacements in the past 12 months: no 3. Major health problems in the past 3 months: no 4. Has an artificial valve or MVP:no 5. Has been advised in past to take antibiotics in advance of a procedure like teeth cleaning: no}    MEDICATIONS & ALLERGIES:    Patient reports the following regarding taking any blood thinners:   Plavix? no Aspirin?yes  Coumadin?  no  Patient confirms/reports the following medications:  Current Outpatient Prescriptions  Medication Sig Dispense Refill  . aspirin 81 MG tablet Take 81 mg by mouth daily.        . carvedilol (COREG) 12.5 MG tablet Take 12.5 mg by mouth 2 (two) times daily with a meal.        . lansoprazole (PREVACID) 30 MG capsule Take 30 mg by mouth daily. Pt takes one tablet every other day      . metFORMIN (GLUMETZA) 1000 MG (MOD) 24 hr tablet Take 1,000 mg by mouth 2 (two) times daily with a meal.       . nitroGLYCERIN (NITROSTAT) 0.4 MG SL tablet Place 1 tablet (0.4 mg total) under the tongue every 5 (five) minutes as needed for chest pain.  25 tablet  11  . omega-3 acid ethyl esters (LOVAZA) 1 G capsule Take 2 g by mouth 2 (two) times daily.        . valsartan (DIOVAN) 160 MG tablet Take 1 tablet (160 mg total) by mouth daily.  30 tablet  11  . Omeprazole Magnesium (PRILOSEC OTC PO) Take 1 capsule by mouth daily. Taking  Every other day        Patient confirms/reports the following allergies:  Allergies  Allergen Reactions  . Beta Adrenergic Blockers     Patient is appropriate to schedule for requested procedure(s): yes  AUTHORIZATION INFORMATION Primary  Insurance:   ID #:   Group #:  Pre-Cert / Auth required: Pre-Cert / Auth #:   Secondary Insurance:   ID #:   Group #:  Pre-Cert / Auth required:  Pre-Cert / Auth #:   No orders of the defined types were placed in this encounter.    SCHEDULE INFORMATION: Procedure has been scheduled as follows:  Date: 10/07/2011    Time: 8:15 AM  Location: [facility] Short Stay  This Gastroenterology Pre-Precedure Form is being routed to the following provider(s) for review: R. Roetta Sessions, MD

## 2011-09-15 NOTE — Telephone Encounter (Signed)
Rx and instructions mailed to pt.  

## 2011-09-29 MED ORDER — EPINEPHRINE HCL 1 MG/ML IJ SOLN
INTRAMUSCULAR | Status: AC
  Filled 2011-09-29: qty 1

## 2011-10-06 ENCOUNTER — Encounter (HOSPITAL_COMMUNITY): Payer: Self-pay | Admitting: Pharmacy Technician

## 2011-10-06 MED ORDER — SODIUM CHLORIDE 0.45 % IV SOLN
Freq: Once | INTRAVENOUS | Status: AC
  Administered 2011-10-07: 08:00:00 via INTRAVENOUS

## 2011-10-07 ENCOUNTER — Ambulatory Visit (HOSPITAL_COMMUNITY)
Admission: RE | Admit: 2011-10-07 | Discharge: 2011-10-07 | Disposition: A | Discharge status: Home Health | Payer: 59 | Source: Ambulatory Visit | Attending: Internal Medicine | Admitting: Internal Medicine

## 2011-10-07 ENCOUNTER — Encounter (HOSPITAL_COMMUNITY): Payer: Self-pay | Admitting: *Deleted

## 2011-10-07 ENCOUNTER — Encounter (HOSPITAL_COMMUNITY)
Admission: RE | Disposition: A | Discharge status: Home Health | Payer: Self-pay | Source: Ambulatory Visit | Attending: Internal Medicine

## 2011-10-07 DIAGNOSIS — E7800 Pure hypercholesterolemia, unspecified: Secondary | ICD-10-CM | POA: Insufficient documentation

## 2011-10-07 DIAGNOSIS — D126 Benign neoplasm of colon, unspecified: Secondary | ICD-10-CM | POA: Insufficient documentation

## 2011-10-07 DIAGNOSIS — Z139 Encounter for screening, unspecified: Secondary | ICD-10-CM

## 2011-10-07 DIAGNOSIS — Z1211 Encounter for screening for malignant neoplasm of colon: Secondary | ICD-10-CM

## 2011-10-07 DIAGNOSIS — I10 Essential (primary) hypertension: Secondary | ICD-10-CM | POA: Insufficient documentation

## 2011-10-07 DIAGNOSIS — K5730 Diverticulosis of large intestine without perforation or abscess without bleeding: Secondary | ICD-10-CM

## 2011-10-07 DIAGNOSIS — E119 Type 2 diabetes mellitus without complications: Secondary | ICD-10-CM | POA: Insufficient documentation

## 2011-10-07 DIAGNOSIS — Z79899 Other long term (current) drug therapy: Secondary | ICD-10-CM | POA: Insufficient documentation

## 2011-10-07 HISTORY — PX: COLONOSCOPY: SHX5424

## 2011-10-07 SURGERY — COLONOSCOPY
Anesthesia: Moderate Sedation

## 2011-10-07 MED ORDER — MIDAZOLAM HCL 5 MG/5ML IJ SOLN
INTRAMUSCULAR | Status: AC
  Filled 2011-10-07: qty 10

## 2011-10-07 MED ORDER — SIMETHICONE 40 MG/0.6ML PO SUSP
ORAL | Status: DC | PRN
  Administered 2011-10-07: 08:00:00

## 2011-10-07 MED ORDER — MIDAZOLAM HCL 5 MG/5ML IJ SOLN
INTRAMUSCULAR | Status: DC | PRN
  Administered 2011-10-07: 2 mg via INTRAVENOUS

## 2011-10-07 MED ORDER — MEPERIDINE HCL 100 MG/ML IJ SOLN
INTRAMUSCULAR | Status: DC | PRN
  Administered 2011-10-07: 50 mg via INTRAVENOUS

## 2011-10-07 MED ORDER — MEPERIDINE HCL 100 MG/ML IJ SOLN
INTRAMUSCULAR | Status: AC
  Filled 2011-10-07: qty 1

## 2011-10-07 NOTE — Discharge Instructions (Addendum)
Colonoscopy Discharge Instructions  Read the instructions outlined below and refer to this sheet in the next few weeks. These discharge instructions provide you with general information on caring for yourself after you leave the hospital. Your doctor may also give you specific instructions. While your treatment has been planned according to the most current medical practices available, unavoidable complications occasionally occur. If you have any problems or questions after discharge, call Dr. Jena Gauss at [phone]. ACTIVITY  You may resume your regular activity, but move at a slower pace for the next 24 hours.   Take frequent rest periods for the next 24 hours.   Walking will help get rid of the air and reduce the bloated feeling in your belly (abdomen).   No driving for 24 hours (because of the medicine (anesthesia) used during the test).    Do not sign any important legal documents or operate any machinery for 24 hours (because of the anesthesia used during the test).  NUTRITION  Drink plenty of fluids.   You may resume your normal diet as instructed by your doctor.   Begin with a light meal and progress to your normal diet. Heavy or fried foods are harder to digest and may make you feel sick to your stomach (nauseated).   Avoid alcoholic beverages for 24 hours or as instructed.  MEDICATIONS  You may resume your normal medications unless your doctor tells you otherwise.  WHAT YOU CAN EXPECT TODAY  Some feelings of bloating in the abdomen.   Passage of more gas than usual.   Spotting of blood in your stool or on the toilet paper.  IF YOU HAD POLYPS REMOVED DURING THE COLONOSCOPY:  No aspirin products for 7 days or as instructed.   No alcohol for 7 days or as instructed.   Eat a soft diet for the next 24 hours.  FINDING OUT THE RESULTS OF YOUR TEST Not all test results are available during your visit. If your test results are not back during the visit, make an appointment  with your caregiver to find out the results. Do not assume everything is normal if you have not heard from your caregiver or the medical facility. It is important for you to follow up on all of your test results.  SEEK IMMEDIATE MEDICAL ATTENTION IF:  You have more than a spotting of blood in your stool.   Your belly is swollen (abdominal distention).   You are nauseated or vomiting.   You have a temperature over 101.   You have abdominal pain or discomfort that is severe or gets worse throughout the day.     Polyp and diverticulosis information or body.  Further recommendations to follow pending review of pathology report  Colon Polyps A polyp is extra tissue that grows inside your body. Colon polyps grow in the large intestine. The large intestine, also called the colon, is part of your digestive system. It is a long, hollow tube at the end of your digestive tract where your body makes and stores stool. Most polyps are not dangerous. They are benign. This means they are not cancerous. But over time, some types of polyps can turn into cancer. Polyps that are smaller than a pea are usually not harmful. But larger polyps could someday become or may already be cancerous. To be safe, doctors remove all polyps and test them.  WHO GETS POLYPS? Anyone can get polyps, but certain people are more likely than others. You may have a greater chance of  You are over 50.   You have had polyps before.   Someone in your family has had polyps.   Someone in your family has had cancer of the large intestine.   Find out if someone in your family has had polyps. You may also be more likely to get polyps if you:   Eat a lot of fatty foods.   Smoke.   Drink alcohol.   Do not exercise.   Eat too much.  SYMPTOMS  Most small polyps do not cause symptoms. People often do not know they have one until their caregiver finds it during a regular checkup or while testing them for  something else. Some people do have symptoms like these:  Bleeding from the anus. You might notice blood on your underwear or on toilet paper after you have had a bowel movement.   Constipation or diarrhea that lasts more than a week.   Blood in the stool. Blood can make stool look black or it can show up as red streaks in the stool.  If you have any of these symptoms, see your caregiver. HOW DOES THE DOCTOR TEST FOR POLYPS? The doctor can use four tests to check for polyps:  Digital rectal exam. The caregiver wears gloves and checks your rectum (the last part of the large intestine) to see if it feels normal. This test would find polyps only in the rectum. Your caregiver may need to do one of the other tests listed below to find polyps higher up in the intestine.   Barium enema. The caregiver puts a liquid called barium into your rectum before taking x-rays of your large intestine. Barium makes your intestine look white in the pictures. Polyps are dark, so they are easy to see.   Sigmoidoscopy. With this test, the caregiver can see inside your large intestine. A thin flexible tube is placed into your rectum. The device is called a sigmoidoscope, which has a light and a tiny video camera in it. The caregiver uses the sigmoidoscope to look at the last third of your large intestine.   Colonoscopy. This test is like sigmoidoscopy, but the caregiver looks at all of the large intestine. It usually requires sedation. This is the most common method for finding and removing polyps.  TREATMENT   The caregiver will remove the polyp during sigmoidoscopy or colonoscopy. The polyp is then tested for cancer.   If you have had polyps, your caregiver may want you to get tested regularly in the future.  PREVENTION  There is not one sure way to prevent polyps. You might be able to lower your risk of getting them if you:  Eat more fruits and vegetables and less fatty food.   Do not smoke.   Avoid  alcohol.   Exercise every day.   Lose weight if you are overweight.   Eating more calcium and folate can also lower your risk of getting polyps. Some foods that are rich in calcium are milk, cheese, and broccoli. Some foods that are rich in folate are chickpeas, kidney beans, and spinach.   Aspirin might help prevent polyps. Studies are under way.  Document Released: 04/06/2004 Document Revised: 06/30/2011 Document Reviewed: 09/12/2007 Exit[employer] Patient Information 2012 [employer].  Diverticulosis Diverticulosis is a common condition that develops when small pouches (diverticula) form in the wall of the colon. The risk of diverticulosis increases with age. It happens more often in people who eat a low-fiber diet. Most individuals with diverticulosis have no symptoms.   Those individuals with symptoms usually experience abdominal pain, constipation, or loose stools (diarrhea). HOME CARE INSTRUCTIONS   Increase the amount of fiber in your diet as directed by your caregiver or dietician. This may reduce symptoms of diverticulosis.   Your caregiver may recommend taking a dietary fiber supplement.   Drink at least 6 to 8 glasses of water each day to prevent constipation.   Try not to strain when you have a bowel movement.   Your caregiver may recommend avoiding nuts and seeds to prevent complications, although this is still an uncertain benefit.   Only take over-the-counter or prescription medicines for pain, discomfort, or fever as directed by your caregiver.  FOODS WITH HIGH FIBER CONTENT INCLUDE:  Fruits. Apple, peach, pear, tangerine, raisins, prunes.   Vegetables. Brussels sprouts, asparagus, broccoli, cabbage, carrot, cauliflower, romaine lettuce, spinach, summer squash, tomato, winter squash, zucchini.   Starchy Vegetables. Baked beans, kidney beans, lima beans, split peas, lentils, potatoes (with skin).   Grains. Whole wheat bread, brown rice, bran flake cereal, plain oatmeal,  white rice, shredded wheat, bran muffins.  SEEK IMMEDIATE MEDICAL CARE IF:   You develop increasing pain or severe bloating.   You have an oral temperature above 102 F (38.9 C), not controlled by medicine.   You develop vomiting or bowel movements that are bloody or black.  Document Released: 04/07/2004 Document Revised: 06/30/2011 Document Reviewed: 12/09/2009 [employer] Patient Information 2012 [employer]. 

## 2011-10-07 NOTE — H&P (Signed)
Primary Care Physician:  Cassell Smiles., MD, MD Primary Gastroenterologist:  Dr. Jena Gauss  Pre-Procedure History & Physical: HPI:  Eric Ball is a 61 y.o. male is here for a screening colonoscopy. No prior colonoscopy. No bowel symptoms. Family history colon polyps or colon cancer.  Past Medical History  Diagnosis Date  . Coronary artery disease   . History of myocardial infarction 2004    with a stent placed at that time  . Diabetes mellitus   . Hypercholesterolemia     with intolerant ot many of the cholesterol medicaitons  . Ischemic heart disease   . Hypertension     essentai hypertension    Past Surgical History  Procedure Date  . Nasal septum surgery 02/20/2001    Septal reconstruction and turbinate reduction  . Cardiac catheterization 09/25/2002    EF-approximately 45% / stent placement to the mid arteriovenous circumflex with reduction of 99% narrowing to 0% with placement of a 3.0 x 16 mm Express II stent with improvement of TIMI grade 2 to TIMI grade 3 flow.  . Back surgery 2002    lower lumbar back    Prior to Admission medications   Medication Sig Start Date End Date Taking? Authorizing Provider  aspirin 81 MG tablet Take 81 mg by mouth daily.     Yes Historical Provider, MD  carvedilol (COREG) 12.5 MG tablet Take 12.5 mg by mouth 2 (two) times daily with a meal.     Yes Historical Provider, MD  lansoprazole (PREVACID) 30 MG capsule Take 30 mg by mouth daily. Pt takes one tablet every other day   Yes Historical Provider, MD  metFORMIN (GLUMETZA) 1000 MG (MOD) 24 hr tablet Take 1,000 mg by mouth 2 (two) times daily with a meal.    Yes Historical Provider, MD  omega-3 acid ethyl esters (LOVAZA) 1 G capsule Take 2 g by mouth 2 (two) times daily.     Yes Historical Provider, MD  valsartan (DIOVAN) 160 MG tablet Take 1 tablet (160 mg total) by mouth daily. 01/27/11  Yes Cassell Clement, MD  nitroGLYCERIN (NITROSTAT) 0.4 MG SL tablet Place 1 tablet (0.4 mg total)  under the tongue every 5 (five) minutes as needed for chest pain. 01/25/11 01/25/12  Cassell Clement, MD    Allergies as of 09/14/2011 - Review Complete 09/14/2011  Allergen Reaction Noted  . Beta adrenergic blockers  11/19/2010    Family History  Problem Relation Age of Onset  . Coronary artery disease Father     with multiple angioplasties  . Hypertension      runs in the family    History   Social History  . Marital Status: Married    Spouse Name: N/A    Number of Children: N/A  . Years of Education: N/A   Occupational History  . Not on file.   Social History Main Topics  . Smoking status: Never Smoker   . Smokeless tobacco: Not on file  . Alcohol Use: No  . Drug Use: No  . Sexually Active: Not on file   Other Topics Concern  . Not on file   Social History Narrative  . No narrative on file    Review of Systems: See HPI, otherwise negative ROS  Physical Exam: BP 134/84  Pulse 77  Temp(Src) 98.8 F (37.1 C) (Oral)  Resp 18  Ht 5' 9.5" (1.765 m)  Wt 175 lb (79.379 kg)  BMI 25.47 kg/m2  SpO2 96% General:   Alert,  Well-developed, well-nourished,  pleasant and cooperative in NAD Head:  Normocephalic and atraumatic. Eyes:  Sclera clear, no icterus.   Conjunctiva pink. Ears:  Normal auditory acuity. Nose:  No deformity, discharge,  or lesions. Mouth:  No deformity or lesions, dentition normal. Neck:  Supple; no masses or thyromegaly. Lungs:  Clear throughout to auscultation.   No wheezes, crackles, or rhonchi. No acute distress. Heart:  Regular rate and rhythm; no murmurs, clicks, rubs,  or gallops. Abdomen:  Soft, nontender and nondistended. No masses, hepatosplenomegaly or hernias noted. Normal bowel sounds, without guarding, and without rebound.   Msk:  Symmetrical without gross deformities. Normal posture. Pulses:  Normal pulses noted. Extremities:  Without clubbing or edema. Neurologic:  Alert and  oriented x4;  grossly normal neurologically. Skin:   Intact without significant lesions or rashes. Cervical Nodes:  No significant cervical adenopathy. Psych:  Alert and cooperative. Normal mood and affect.  Impression/Plan: [name] is now here to undergo a screening colonoscopy.  Risks, benefits, limitations, imponderables and alternatives regarding colonoscopy have been reviewed with the patient. Questions have been answered. All parties agreeable.

## 2011-10-07 NOTE — Op Note (Signed)
[facility] [address] [place], [place]  [address]  COLONOSCOPY PROCEDURE REPORT  PATIENT:  Eric Ball, Eric Ball  MR#:  [id number] BIRTHDATE:  March 01, 1951, 60 yrs. old  GENDER:  male ENDOSCOPIST:  R. Roetta Sessions, MD FACP [facility] REF. BY:  Artis Delay, M.D. Cassell Clement, M.D. PROCEDURE DATE:  10/07/2011 PROCEDURE:  Colonoscopy with biopsy and snare polypectomy  INDICATIONS:  First-ever average risk screening colonoscopy  INFORMED CONSENT:  The risks, benefits, alternatives and imponderables including but not limited to bleeding, perforation as well as the possibility of a missed lesion have been reviewed. The potential for biopsy, lesion removal, etc. have also been discussed.  Questions have been answered.  All parties agreeable. Please see the history and physical in the medical record for more information.  MEDICATIONS:  Versed 2 mg IV and Demerol 50 mg IV in a single dose  DESCRIPTION OF PROCEDURE:  After a digital rectal exam was performed, the EC-[id number] ([id number]) colonoscope was advanced from the anus through the rectum and colon to the area of the cecum, ileocecal valve and appendiceal orifice.  The cecum was deeply intubated.  These structures were well-seen and photographed for the record.  From the level of the cecum and ileocecal valve, the scope was slowly and cautiously withdrawn.  The mucosal surfaces were carefully surveyed utilizing scope tip deflection to facilitate fold flattening as needed.  The scope was pulled down into the rectum where a thorough examination including retroflexion was performed. <<PROCEDUREIMAGES>>  FINDINGS: Adequate preparation. Normal rectum. Scattered pancolonic diverticula. Multiple cecal and descending colon polyps-the largest being approximately 6 mm in dimensions; the patient had a single diminutive mid sigmoid polyp; remainder of the colonic mucosa appeared normal.  THERAPEUTIC / DIAGNOSTIC MANEUVERS PERFORMED:    The above-mentioned polyps were removed with hot and cold snare technique as well as cold biopsy removal.  COMPLICATIONS:  None  CECAL WITHDRAWAL TIME: 18 minutes  IMPRESSION: Multiple colonic polyps-removed as described above. Colonic diverticulosis  RECOMMENDATIONS:      Followup on pathology  ______________________________ R. Roetta Sessions, MD Caleen Essex  CC:  Artis Delay, M.D. Cassell Clement, M.D.  n. eSIGNED:   R. Roetta Sessions at 10/07/2011 09:06 AM  Verne Carrow, [phone]

## 2011-10-11 ENCOUNTER — Encounter (HOSPITAL_COMMUNITY): Payer: Self-pay | Admitting: Internal Medicine

## 2011-10-16 ENCOUNTER — Encounter: Payer: Self-pay | Admitting: Internal Medicine

## 2011-10-17 ENCOUNTER — Telehealth: Payer: Self-pay | Admitting: Cardiology

## 2011-10-17 NOTE — Telephone Encounter (Signed)
Please return call to patient concerning medications, he can be reached at [phone].

## 2011-10-17 NOTE — Telephone Encounter (Signed)
PCP wants him to start Welchol 3 twice a day.  He wants to make sure ok with  Dr. Patty Sermons before starting. Patient states he does not tolerate statins secondary to joint pain.  Will forward to  Dr. Patty Sermons for review.

## 2011-10-17 NOTE — Telephone Encounter (Signed)
I agree with trial of WelChol

## 2011-11-04 ENCOUNTER — Telehealth: Payer: Self-pay | Admitting: Cardiology

## 2011-11-04 NOTE — Telephone Encounter (Signed)
New msg Pt wants to talk to you about welcall. Please call him back about side effects

## 2011-11-04 NOTE — Telephone Encounter (Signed)
Patient is aware that Dr. Patty Sermons agreed with trial of Welchol.

## 2011-12-23 ENCOUNTER — Other Ambulatory Visit: Payer: Self-pay | Admitting: *Deleted

## 2011-12-23 ENCOUNTER — Encounter: Payer: Self-pay | Admitting: Cardiology

## 2011-12-23 ENCOUNTER — Ambulatory Visit (INDEPENDENT_AMBULATORY_CARE_PROVIDER_SITE_OTHER): Payer: 59 | Admitting: Cardiology

## 2011-12-23 VITALS — BP 101/78 | HR 70 | Resp 18 | Ht 70.0 in | Wt 171.1 lb

## 2011-12-23 DIAGNOSIS — I119 Hypertensive heart disease without heart failure: Secondary | ICD-10-CM

## 2011-12-23 DIAGNOSIS — E119 Type 2 diabetes mellitus without complications: Secondary | ICD-10-CM

## 2011-12-23 DIAGNOSIS — I2510 Atherosclerotic heart disease of native coronary artery without angina pectoris: Secondary | ICD-10-CM

## 2011-12-23 DIAGNOSIS — E7800 Pure hypercholesterolemia, unspecified: Secondary | ICD-10-CM

## 2011-12-23 LAB — BASIC METABOLIC PANEL
CO2: 23 mEq/L (ref 19–32)
Calcium: 9.2 mg/dL (ref 8.4–10.5)
GFR: 77.48 mL/min (ref >60.00)
Potassium: 4.5 mEq/L (ref 3.5–5.1)
Sodium: 137 mEq/L (ref 135–145)

## 2011-12-23 LAB — LIPID PANEL
Cholesterol: 299 mg/dL — ABNORMAL HIGH (ref 0–200)
HDL: 51.1 mg/dL (ref >39.00)
Total CHOL/HDL Ratio: 6
Triglycerides: 221 mg/dL — ABNORMAL HIGH (ref 0.0–149.0)

## 2011-12-23 LAB — HEPATIC FUNCTION PANEL
AST: 46 U/L — ABNORMAL HIGH (ref 0–37)
Albumin: 4.2 g/dL (ref 3.5–5.2)
Total Bilirubin: 0.7 mg/dL (ref 0.3–1.2)

## 2011-12-23 LAB — LDL CHOLESTEROL, DIRECT: Direct LDL: 206.6 mg/dL

## 2011-12-23 NOTE — Assessment & Plan Note (Signed)
The patient exercises regularly and stays physically active.  He has not been having any recurrent chest pain to suggest angina pectoris.  Energy level is good.  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

## 2011-12-23 NOTE — Assessment & Plan Note (Signed)
The patient has a history of hypercholesterolemia.  He is intolerant of statin therapy.  He is now taking WelChol 2 tablets twice a day and is tolerating it well.  Blood work today is pending.

## 2011-12-23 NOTE — Assessment & Plan Note (Signed)
Patient has a history of essential hypertension.  Is not having any dizzy spells or headaches.  He has had no syncope.

## 2011-12-23 NOTE — Progress Notes (Signed)
Eric Ball Date of Birth:  02/02/1951 [facility] [address] [place], [place]  [address] [phone]         Fax   [phone]  History of Present Illness: This pleasant 61 year old gentleman is seen for a four-month followup office visit.  He has a history of known ischemic heart disease.  He underwent catheterization in 2004 at the time of her acute myocardial infarction and a stent was placed.  The patient has done well since that time.  He had a nuclear stress test in March 2009 which showed no evidence of ischemia and his ejection fraction was 70%.  Patient also has a history of high blood pressure, diabetes, and hypercholesterolemia.  Current Outpatient Prescriptions  Medication Sig Dispense Refill  . aspirin 81 MG tablet Take 81 mg by mouth daily.        . carvedilol (COREG) 12.5 MG tablet Take 12.5 mg by mouth 2 (two) times daily with a meal.        . lansoprazole (PREVACID) 30 MG capsule Take 30 mg by mouth daily. Pt takes one tablet every other day      . metFORMIN (GLUMETZA) 1000 MG (MOD) 24 hr tablet Take 1,000 mg by mouth 2 (two) times daily with a meal.       . nitroGLYCERIN (NITROSTAT) 0.4 MG SL tablet Place 1 tablet (0.4 mg total) under the tongue every 5 (five) minutes as needed for chest pain.  25 tablet  11  . omega-3 acid ethyl esters (LOVAZA) 1 G capsule Take 2 g by mouth 2 (two) times daily.        . valsartan (DIOVAN) 160 MG tablet Take 1 tablet (160 mg total) by mouth daily.  30 tablet  11  . DISCONTD: Omeprazole Magnesium (PRILOSEC OTC PO) Take 1 capsule by mouth daily. Taking  Every other day        Allergies  Allergen Reactions  . Beta Adrenergic Blockers     Patient Active Problem List  Diagnoses  . Coronary artery disease  . Diabetes mellitus  . Hypercholesterolemia  . Ischemic heart disease  . Hypertension    History  Smoking status  . Never Smoker   Smokeless tobacco  . Not on file    History  Alcohol Use No      Family History  Problem Relation Age of Onset  . Coronary artery disease Father     with multiple angioplasties  . Hypertension      runs in the family    Review of Systems: Constitutional: no fever chills diaphoresis or fatigue or change in weight.  Head and neck: no hearing loss, no epistaxis, no photophobia or visual disturbance. Respiratory: No cough, shortness of breath or wheezing. Cardiovascular: No chest pain peripheral edema, palpitations. Gastrointestinal: No abdominal distention, no abdominal pain, no change in bowel habits hematochezia or melena. Genitourinary: No dysuria, no frequency, no urgency, no nocturia. Musculoskeletal:No arthralgias, no back pain, no gait disturbance or myalgias. Neurological: No dizziness, no headaches, no numbness, no seizures, no syncope, no weakness, no tremors. Hematologic: No lymphadenopathy, no easy bruising. Psychiatric: No confusion, no hallucinations, no sleep disturbance.    Physical Exam: Filed Vitals:   12/23/11 0855  BP: 101/78  Pulse: 70  Resp: 18   the general appearance reveals a well-developed well-nourished gentleman in no distress.The head and neck exam reveals pupils equal and reactive.  Extraocular movements are full.  There is no scleral icterus.  The mouth and  pharynx are normal.  The neck is supple.  The carotids reveal no bruits.  The jugular venous pressure is normal.  The  thyroid is not enlarged.  There is no lymphadenopathy.  The chest is clear to percussion and auscultation.  There are no rales or rhonchi.  Expansion of the chest is symmetrical.  The precordium is quiet.  The first heart sound is normal.  The second heart sound is physiologically split.  There is no murmur gallop rub or click.  There is no abnormal lift or heave.  The abdomen is soft and nontender.  The bowel sounds are normal.  The liver and spleen are not enlarged.  There are no abdominal masses.  There are no abdominal bruits.  Extremities  reveal good pedal pulses.  There is no phlebitis or edema.  There is no cyanosis or clubbing.  Strength is normal and symmetrical in all extremities.  There is no lateralizing weakness.  There are no sensory deficits.  The skin is warm and dry.  There is no rash.  EKG shows normal sinus rhythm and is within normal limits.   Assessment / Plan: The patient is to continue same medication.  Is to continue to try to get regular exercise.  His weight is down 6 pounds since last visit which is good.  Return in 4 months for followup office visit lipid panel hepatic function panel nasal metabolic panel and A1c.  His A1c should improve on WelChol.

## 2011-12-23 NOTE — Assessment & Plan Note (Signed)
The patient has a history of adult-onset diabetes mellitus.  His blood sugars usually run in the 140 range.  He has not been having any hypoglycemic episodes.

## 2011-12-23 NOTE — Patient Instructions (Signed)
Will obtain labs today and call you with the results  Your physician recommends that you continue on your current medications as directed. Please refer to the Current Medication list given to you today. Your physician wants you to follow-up in: 4 months  You will receive a reminder letter in the mail two months in advance. If you don't receive a letter, please call our office to schedule the follow-up appointment.  

## 2011-12-25 NOTE — Progress Notes (Signed)
Quick Note:  Please report to patient. The recent labs are stable. Continue same medication and careful diet.The A1C is improved and the lipids are improved. Continue Welchol. Send copy of labs to Dr. Sherwood Gambler. ______

## 2012-02-23 ENCOUNTER — Other Ambulatory Visit: Payer: Self-pay | Admitting: Cardiology

## 2012-02-23 DIAGNOSIS — I119 Hypertensive heart disease without heart failure: Secondary | ICD-10-CM

## 2012-02-23 MED ORDER — VALSARTAN 160 MG PO TABS: 160.0000 mg | ORAL_TABLET | Freq: Every day | ORAL | Status: DC

## 2012-02-24 ENCOUNTER — Other Ambulatory Visit: Payer: Self-pay | Admitting: *Deleted

## 2012-02-24 DIAGNOSIS — I119 Hypertensive heart disease without heart failure: Secondary | ICD-10-CM

## 2012-02-24 MED ORDER — CARVEDILOL 12.5 MG PO TABS: 12.5000 mg | ORAL_TABLET | Freq: Two times a day (BID) | ORAL | Status: DC

## 2012-02-24 MED ORDER — VALSARTAN 160 MG PO TABS: 160.0000 mg | ORAL_TABLET | Freq: Every day | ORAL | Status: DC

## 2012-02-24 MED ORDER — NITROGLYCERIN 0.4 MG SL SUBL: 0.4000 mg | SUBLINGUAL_TABLET | SUBLINGUAL | Status: DC | PRN

## 2012-02-24 NOTE — Telephone Encounter (Signed)
Fax Received. Refill Completed. Eric Ball (R.M.A)   

## 2012-06-07 ENCOUNTER — Ambulatory Visit (INDEPENDENT_AMBULATORY_CARE_PROVIDER_SITE_OTHER): Payer: 59 | Admitting: Cardiology

## 2012-06-07 ENCOUNTER — Encounter: Payer: Self-pay | Admitting: Cardiology

## 2012-06-07 ENCOUNTER — Other Ambulatory Visit (INDEPENDENT_AMBULATORY_CARE_PROVIDER_SITE_OTHER): Payer: 59

## 2012-06-07 VITALS — BP 140/76 | HR 74 | Resp 18 | Ht 69.0 in | Wt 172.8 lb

## 2012-06-07 DIAGNOSIS — I119 Hypertensive heart disease without heart failure: Secondary | ICD-10-CM

## 2012-06-07 DIAGNOSIS — I10 Essential (primary) hypertension: Secondary | ICD-10-CM

## 2012-06-07 DIAGNOSIS — I2510 Atherosclerotic heart disease of native coronary artery without angina pectoris: Secondary | ICD-10-CM

## 2012-06-07 DIAGNOSIS — IMO0001 Reserved for inherently not codable concepts without codable children: Secondary | ICD-10-CM

## 2012-06-07 DIAGNOSIS — I259 Chronic ischemic heart disease, unspecified: Secondary | ICD-10-CM

## 2012-06-07 DIAGNOSIS — E7800 Pure hypercholesterolemia, unspecified: Secondary | ICD-10-CM

## 2012-06-07 DIAGNOSIS — E119 Type 2 diabetes mellitus without complications: Secondary | ICD-10-CM

## 2012-06-07 LAB — BASIC METABOLIC PANEL
Calcium: 9.3 mg/dL (ref 8.4–10.5)
Creatinine, Ser: 1 mg/dL (ref 0.4–1.5)
GFR: 80.94 mL/min (ref >60.00)
Sodium: 137 mEq/L (ref 135–145)

## 2012-06-07 LAB — HEPATIC FUNCTION PANEL
AST: 63 U/L — ABNORMAL HIGH (ref 0–37)
Albumin: 4.4 g/dL (ref 3.5–5.2)
Alkaline Phosphatase: 39 U/L (ref 39–117)
Total Protein: 7.6 g/dL (ref 6.0–8.3)

## 2012-06-07 LAB — LIPID PANEL
Cholesterol: 305 mg/dL — ABNORMAL HIGH (ref 0–200)
Triglycerides: 265 mg/dL — ABNORMAL HIGH (ref 0.0–149.0)

## 2012-06-07 LAB — HEMOGLOBIN A1C: Hgb A1c MFr Bld: 8.1 % — ABNORMAL HIGH (ref 4.6–6.5)

## 2012-06-07 LAB — LDL CHOLESTEROL, DIRECT: Direct LDL: 203.4 mg/dL

## 2012-06-07 NOTE — Progress Notes (Signed)
Eric Ball Date of Birth:  03-Sep-1950 [facility] [address] [place], [place]  [address] [phone]         Fax   [phone]  History of Present Illness: This pleasant 61 year old gentleman is seen for a four-month followup office visit. He has a history of known ischemic heart disease. He underwent catheterization in 2004 at the time of her acute myocardial infarction and a stent was placed. The patient has done well since that time. He had a nuclear stress test in March 2009 which showed no evidence of ischemia and his ejection fraction was 70%. Patient also has a history of high blood pressure, diabetes, and hypercholesterolemia.  Since last visit has had no new cardiac symptoms   Current Outpatient Prescriptions  Medication Sig Dispense Refill  . aspirin 81 MG tablet Take 81 mg by mouth daily.        . carvedilol (COREG) 12.5 MG tablet Take 1 tablet (12.5 mg total) by mouth 2 (two) times daily with a meal.  180 tablet  5  . colesevelam (WELCHOL) 625 MG tablet Take 625 mg by mouth 2 (two) times daily with a meal.      . lansoprazole (PREVACID) 30 MG capsule Take 30 mg by mouth daily. Pt takes one tablet every other day      . metFORMIN (GLUMETZA) 1000 MG (MOD) 24 hr tablet Take 1,000 mg by mouth 2 (two) times daily with a meal.       . nitroGLYCERIN (NITROSTAT) 0.4 MG SL tablet Place 1 tablet (0.4 mg total) under the tongue every 5 (five) minutes as needed for chest pain.  25 tablet  5  . omega-3 acid ethyl esters (LOVAZA) 1 G capsule Take 2 g by mouth 2 (two) times daily.        . valsartan (DIOVAN) 160 MG tablet Take 1 tablet (160 mg total) by mouth daily.  90 tablet  3  . [DISCONTINUED] Omeprazole Magnesium (PRILOSEC OTC PO) Take 1 capsule by mouth daily. Taking  Every other day        Allergies  Allergen Reactions  . Beta Adrenergic Blockers     Patient Active Problem List  Diagnosis  . Coronary artery disease  . Diabetes mellitus  .  Hypercholesterolemia  . Ischemic heart disease  . Hypertension    History  Smoking status  . Never Smoker   Smokeless tobacco  . Not on file    History  Alcohol Use No    Family History  Problem Relation Age of Onset  . Coronary artery disease Father     with multiple angioplasties  . Hypertension      runs in the family    Review of Systems: Constitutional: no fever chills diaphoresis or fatigue or change in weight.  Head and neck: no hearing loss, no epistaxis, no photophobia or visual disturbance. Respiratory: No cough, shortness of breath or wheezing. Cardiovascular: No chest pain peripheral edema, palpitations. Gastrointestinal: No abdominal distention, no abdominal pain, no change in bowel habits hematochezia or melena. Genitourinary: No dysuria, no frequency, no urgency, no nocturia. Musculoskeletal:No arthralgias, no back pain, no gait disturbance or myalgias. Neurological: No dizziness, no headaches, no numbness, no seizures, no syncope, no weakness, no tremors. Hematologic: No lymphadenopathy, no easy bruising. Psychiatric: No confusion, no hallucinations, no sleep disturbance.    Physical Exam: Filed Vitals:   06/07/12 0912  BP: 140/76  Pulse: 74  Resp: 18   the general appearance reveals a  well-developed well-nourished gentleman in no distress.  His weight is up 1 pound since last visit.The head and neck exam reveals pupils equal and reactive.  Extraocular movements are full.  There is no scleral icterus.  The mouth and pharynx are normal.  The neck is supple.  The carotids reveal no bruits.  The jugular venous pressure is normal.  The  thyroid is not enlarged.  There is no lymphadenopathy.  The chest is clear to percussion and auscultation.  There are no rales or rhonchi.  Expansion of the chest is symmetrical.  The precordium is quiet.  The first heart sound is normal.  The second heart sound is physiologically split.  There is no murmur gallop rub or click.   There is no abnormal lift or heave.  The abdomen is soft and nontender.  The bowel sounds are normal.  The liver and spleen are not enlarged.  There are no abdominal masses.  There are no abdominal bruits.  Extremities reveal good pedal pulses.  There is no phlebitis or edema.  There is no cyanosis or clubbing.  Strength is normal and symmetrical in all extremities.  There is no lateralizing weakness.  There are no sensory deficits.  The skin is warm and dry.  There is no rash.     Assessment / Plan: Continue on same medication.  Recheck in 4 months for followup office visit lipid panel hepatic function panel basal metabolic panel and hemoglobin Z6X.  Try to get more regular exercise and trying to adhere to a heart healthy diet

## 2012-06-07 NOTE — Assessment & Plan Note (Signed)
The patient has not been experiencing any chest pain or angina.  He stays busy as a Education officer, environmental of 2 churches.  He is a member of the [employer] but has not been going to the [employer] recently because he has been too busy.

## 2012-06-07 NOTE — Assessment & Plan Note (Signed)
Blood pressure has been remaining stable on current therapy.  He is not having any dizziness or syncope or headaches

## 2012-06-07 NOTE — Assessment & Plan Note (Signed)
The patient has a history of adult-onset diabetes.  He has not been having any hypoglycemic episodes.  He tries to stay on a reasonably careful diet but he does eat out a lot.

## 2012-06-07 NOTE — Assessment & Plan Note (Signed)
The patient is intolerant of statin drugs.  He is able to take WelChol.  Blood work is pending

## 2012-06-07 NOTE — Patient Instructions (Addendum)
Your physician recommends that you continue on your current medications as directed. Please refer to the Current Medication list given to you today.  Your physician recommends that you schedule a follow-up appointment in: 4 months with fasting labs (lp/bmet/hfp/a1c)  Will obtain labs today and call you with the results (lp/bmet/hfp/a1c)  

## 2012-06-12 ENCOUNTER — Telehealth: Payer: Self-pay | Admitting: *Deleted

## 2012-06-12 NOTE — Telephone Encounter (Signed)
Message copied by Burnell Blanks on Tue Jun 12, 2012  2:45 PM ------      Message from: Cassell Clement      Created: Thu Jun 07, 2012  8:40 PM       Please report.  Cholesterol and TG are higher.  BS and A1C are higher.  LFTs are higher c/w fatty liver from the diabetes. He needs to work harder on his diet. Also try to increase Welchol to at least 4 tabs/day.

## 2012-06-12 NOTE — Telephone Encounter (Signed)
Advised patient of lab results  

## 2012-09-05 ENCOUNTER — Other Ambulatory Visit: Payer: Self-pay | Admitting: *Deleted

## 2012-09-12 ENCOUNTER — Telehealth: Payer: Self-pay | Admitting: Cardiology

## 2012-09-12 MED ORDER — METFORMIN HCL 1000 MG PO TABS: 1000.0000 mg | ORAL_TABLET | Freq: Two times a day (BID) | ORAL | Status: DC

## 2012-09-12 NOTE — Telephone Encounter (Signed)
Needs refill on Metormin, refilled as requested

## 2012-09-12 NOTE — Telephone Encounter (Signed)
New Problem    Pt has questions regarding his medication.

## 2012-10-10 ENCOUNTER — Ambulatory Visit (INDEPENDENT_AMBULATORY_CARE_PROVIDER_SITE_OTHER): Payer: 59 | Admitting: Cardiology

## 2012-10-10 ENCOUNTER — Other Ambulatory Visit (INDEPENDENT_AMBULATORY_CARE_PROVIDER_SITE_OTHER): Payer: 59

## 2012-10-10 ENCOUNTER — Encounter: Payer: Self-pay | Admitting: Cardiology

## 2012-10-10 VITALS — BP 122/74 | HR 64 | Ht 69.0 in | Wt 173.2 lb

## 2012-10-10 DIAGNOSIS — IMO0001 Reserved for inherently not codable concepts without codable children: Secondary | ICD-10-CM

## 2012-10-10 DIAGNOSIS — I2510 Atherosclerotic heart disease of native coronary artery without angina pectoris: Secondary | ICD-10-CM

## 2012-10-10 DIAGNOSIS — E7800 Pure hypercholesterolemia, unspecified: Secondary | ICD-10-CM

## 2012-10-10 DIAGNOSIS — I119 Hypertensive heart disease without heart failure: Secondary | ICD-10-CM

## 2012-10-10 LAB — HEPATIC FUNCTION PANEL
ALT: 87 U/L — ABNORMAL HIGH (ref 0–53)
AST: 42 U/L — ABNORMAL HIGH (ref 0–37)
Alkaline Phosphatase: 37 U/L — ABNORMAL LOW (ref 39–117)
Bilirubin, Direct: 0 mg/dL (ref 0.0–0.3)
Total Protein: 7.4 g/dL (ref 6.0–8.3)

## 2012-10-10 LAB — BASIC METABOLIC PANEL
BUN: 18 mg/dL (ref 6–23)
Calcium: 9.4 mg/dL (ref 8.4–10.5)
Chloride: 104 mEq/L (ref 96–112)
Creatinine, Ser: 1.1 mg/dL (ref 0.4–1.5)
GFR: 71.68 mL/min (ref >60.00)

## 2012-10-10 LAB — LDL CHOLESTEROL, DIRECT: Direct LDL: 189.8 mg/dL

## 2012-10-10 NOTE — Patient Instructions (Addendum)
Your physician recommends that you continue on your current medications as directed. Please refer to the Current Medication list given to you today.  Your physician wants you to follow-up in: 4 months with fasting labs (lp/bmet/hfp/a1c)  You will receive a reminder letter in the mail two months in advance. If you don't receive a letter, please call our office to schedule the follow-up appointment.  

## 2012-10-10 NOTE — Progress Notes (Signed)
Eric Ball Date of Birth:  May 13, 1951 [facility] [address] [place], [place]  [address] [phone]         Fax   [phone]  History of Present Illness: This pleasant 62 year old gentleman is seen for a four-month followup office visit. He has a history of known ischemic heart disease. He underwent catheterization in 2004 at the time of his acute myocardial infarction and a stent was placed. The patient has done well since that time. He had a nuclear stress test in March 2009 which showed no evidence of ischemia and his ejection fraction was 70%. Patient also has a history of high blood pressure, diabetes, and hypercholesterolemia. Since last visit has had no new cardiac symptoms.   Current Outpatient Prescriptions  Medication Sig Dispense Refill  . aspirin 81 MG tablet Take 81 mg by mouth daily.        . carvedilol (COREG) 12.5 MG tablet Take 1 tablet (12.5 mg total) by mouth 2 (two) times daily with a meal.  180 tablet  5  . colesevelam (WELCHOL) 625 MG tablet Take 625 mg by mouth 2 (two) times daily with a meal.      . lansoprazole (PREVACID) 30 MG capsule Take 30 mg by mouth daily. Pt takes one tablet every other day      . metFORMIN (GLUCOPHAGE) 1000 MG tablet Take 1 tablet (1,000 mg total) by mouth 2 (two) times daily with a meal.  180 tablet  3  . nitroGLYCERIN (NITROSTAT) 0.4 MG SL tablet Place 1 tablet (0.4 mg total) under the tongue every 5 (five) minutes as needed for chest pain.  25 tablet  5  . omega-3 acid ethyl esters (LOVAZA) 1 G capsule Take 2 g by mouth 2 (two) times daily.        . valsartan (DIOVAN) 160 MG tablet Take 1 tablet (160 mg total) by mouth daily.  90 tablet  3  . [DISCONTINUED] Omeprazole Magnesium (PRILOSEC OTC PO) Take 1 capsule by mouth daily. Taking  Every other day       No current facility-administered medications for this visit.    Allergies  Allergen Reactions  . Beta Adrenergic Blockers     Patient Active  Problem List  Diagnosis  . Coronary artery disease  . Type II or unspecified type diabetes mellitus without mention of complication, uncontrolled  . Hypercholesterolemia  . Ischemic heart disease  . Hypertension    History  Smoking status  . Never Smoker   Smokeless tobacco  . Not on file    History  Alcohol Use No    Family History  Problem Relation Age of Onset  . Coronary artery disease Father     with multiple angioplasties  . Hypertension      runs in the family    Review of Systems: Constitutional: no fever chills diaphoresis or fatigue or change in weight.  Head and neck: no hearing loss, no epistaxis, no photophobia or visual disturbance. Respiratory: No cough, shortness of breath or wheezing. Cardiovascular: No chest pain peripheral edema, palpitations. Gastrointestinal: No abdominal distention, no abdominal pain, no change in bowel habits hematochezia or melena. Genitourinary: No dysuria, no frequency, no urgency, no nocturia. Musculoskeletal:No arthralgias, no back pain, no gait disturbance or myalgias. Neurological: No dizziness, no headaches, no numbness, no seizures, no syncope, no weakness, no tremors. Hematologic: No lymphadenopathy, no easy bruising. Psychiatric: No confusion, no hallucinations, no sleep disturbance.    Physical Exam: Filed Vitals:  10/10/12 0859  BP: 122/74  Pulse: 64   the general appearance reveals a well-developed well-nourished gentleman in no distress.The head and neck exam reveals pupils equal and reactive.  Extraocular movements are full.  There is no scleral icterus.  The mouth and pharynx are normal.  The neck is supple.  The carotids reveal no bruits.  The jugular venous pressure is normal.  The  thyroid is not enlarged.  There is no lymphadenopathy.  The chest is clear to percussion and auscultation.  There are no rales or rhonchi.  Expansion of the chest is symmetrical.  The precordium is quiet.  The first heart sound is  normal.  The second heart sound is physiologically split.  There is no murmur gallop rub or click.  There is no abnormal lift or heave.  The abdomen is soft and nontender.  The bowel sounds are normal.  The liver and spleen are not enlarged.  There are no abdominal masses.  There are no abdominal bruits.  Extremities reveal good pedal pulses.  There is no phlebitis or edema.  There is no cyanosis or clubbing.  Strength is normal and symmetrical in all extremities.  There is no lateralizing weakness.  There are no sensory deficits.  The skin is warm and dry.  There is no rash.    Assessment / Plan: Continue same medication.  Work harder on diet and weight loss.  His weight is up 1 pound today.  He will be rechecked in 4 months for office visit lipid panel hepatic function basal metabolic panel and A1c

## 2012-10-10 NOTE — Assessment & Plan Note (Signed)
The patient is diabetic.  He states that his sugars have been running higher.  For this reason he has started going back to the [facility] and is more regular with the exercise.  He has not been having any hypoglycemic episodes.  We are checking an A1c level today

## 2012-10-10 NOTE — Assessment & Plan Note (Signed)
The patient has not been experiencing any recurrent chest pain or angina. 

## 2012-10-11 NOTE — Progress Notes (Signed)
Quick Note:  Please report to patient. The recent labs are stable. Continue same medication and careful diet. The lipids are better. Cholesterol 282, LDL 7.9. The BS and A1C are better. The LFTs are improving but still high (fatty liver from diabetes). Increase the Welchol from one BID to Two BID. It helps both the diabetes and the cholesterol. ______

## 2012-11-15 ENCOUNTER — Encounter: Payer: Self-pay | Admitting: Cardiology

## 2012-11-20 ENCOUNTER — Telehealth: Payer: Self-pay | Admitting: *Deleted

## 2012-11-20 MED ORDER — COLESEVELAM HCL 625 MG PO TABS: ORAL_TABLET | ORAL | Status: DC

## 2012-11-20 NOTE — Telephone Encounter (Signed)
Advised patient of lab results and medication change  

## 2012-11-20 NOTE — Telephone Encounter (Signed)
Message copied by Burnell Blanks on Tue Nov 20, 2012 10:34 AM ------      Message from: Cassell Clement      Created: Thu Oct 11, 2012  5:25 AM       Please report to patient.  The recent labs are stable. Continue same medication and careful diet. The lipids are better. Cholesterol 282, LDL 7.9.  The BS and A1C are better.  The LFTs are improving but still high (fatty liver from diabetes).      Increase the Welchol from one BID to Two BID. It helps both the diabetes and the cholesterol. ------

## 2013-02-18 ENCOUNTER — Encounter: Payer: Self-pay | Admitting: Cardiology

## 2013-02-18 ENCOUNTER — Ambulatory Visit (INDEPENDENT_AMBULATORY_CARE_PROVIDER_SITE_OTHER): Payer: 59 | Admitting: Cardiology

## 2013-02-18 ENCOUNTER — Other Ambulatory Visit (INDEPENDENT_AMBULATORY_CARE_PROVIDER_SITE_OTHER): Payer: 59

## 2013-02-18 VITALS — BP 122/72 | HR 80 | Ht 69.0 in | Wt 171.4 lb

## 2013-02-18 DIAGNOSIS — E7800 Pure hypercholesterolemia, unspecified: Secondary | ICD-10-CM

## 2013-02-18 DIAGNOSIS — R5381 Other malaise: Secondary | ICD-10-CM

## 2013-02-18 DIAGNOSIS — I10 Essential (primary) hypertension: Secondary | ICD-10-CM

## 2013-02-18 DIAGNOSIS — R5383 Other fatigue: Secondary | ICD-10-CM

## 2013-02-18 DIAGNOSIS — IMO0001 Reserved for inherently not codable concepts without codable children: Secondary | ICD-10-CM

## 2013-02-18 DIAGNOSIS — I259 Chronic ischemic heart disease, unspecified: Secondary | ICD-10-CM

## 2013-02-18 LAB — HEMOGLOBIN A1C: Hgb A1c MFr Bld: 7.6 % — ABNORMAL HIGH (ref 4.6–6.5)

## 2013-02-18 LAB — HEPATIC FUNCTION PANEL
Albumin: 4.2 g/dL (ref 3.5–5.2)
Total Protein: 7.5 g/dL (ref 6.0–8.3)

## 2013-02-18 LAB — BASIC METABOLIC PANEL
BUN: 14 mg/dL (ref 6–23)
CO2: 26 mEq/L (ref 19–32)
Chloride: 102 mEq/L (ref 96–112)
Potassium: 4.7 mEq/L (ref 3.5–5.1)

## 2013-02-18 LAB — LIPID PANEL
Cholesterol: 317 mg/dL — ABNORMAL HIGH (ref 0–200)
HDL: 43.1 mg/dL (ref >39.00)
Total CHOL/HDL Ratio: 7
Triglycerides: 285 mg/dL — ABNORMAL HIGH (ref 0.0–149.0)
VLDL: 57 mg/dL — ABNORMAL HIGH (ref 0.0–40.0)

## 2013-02-18 LAB — LDL CHOLESTEROL, DIRECT: Direct LDL: 224.4 mg/dL

## 2013-02-18 NOTE — Assessment & Plan Note (Signed)
The patient is intolerant to all statins.  We are checking fasting lab work today.  He has been trying to stay on a careful diet.  He is hopeful that his regular exercise at the [facility] will also help his lipid profile.  The patient is also a diabetic and is watching his carbohydrates

## 2013-02-18 NOTE — Patient Instructions (Addendum)
Your physician recommends that you return for lab work in: 4 months (just prior to seeing Dr. Patty Sermons) lipid/liver/bmp/A1C  Your physician wants you to follow-up in: 4 months with Dr. Patty Sermons. You will receive a reminder letter in the mail two months in advance. If you don't receive a letter, please call our office to schedule the follow-up appointment.  Your physician recommends that you continue on your current medications as directed. Please refer to the Current Medication list given to you today.

## 2013-02-18 NOTE — Assessment & Plan Note (Signed)
The patient has not been having any recurrent chest pain or angina.  He exercises at the [facility] early every morning.

## 2013-02-18 NOTE — Assessment & Plan Note (Signed)
Clinically the patient appears to be euthyroid.  No history of hematochezia or melena.  TSH and CBC today is pending

## 2013-02-18 NOTE — Progress Notes (Signed)
Eric Ball Date of Birth:  1951/02/12 [facility] [address] [place], [place]  [address] [phone]         Fax   [phone]  History of Present Illness: This pleasant 62 year old gentleman is seen for a four-month followup office visit. He has a history of known ischemic heart disease. He underwent catheterization in 2004 at the time of his acute myocardial infarction and a stent was placed. The patient has done well since that time. He had a nuclear stress test in March 2009 which showed no evidence of ischemia and his ejection fraction was 70%. Patient also has a history of high blood pressure, diabetes, and hypercholesterolemia. Since last visit has had no new cardiac symptoms.  The patient states that he has felt more fatigued over the past several months.  He stays very busy.  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  He does not have any history of thyroid problems or anemia.  We will check TSH and CBC today   Current Outpatient Prescriptions  Medication Sig Dispense Refill  . aspirin 81 MG tablet Take 81 mg by mouth daily.        . carvedilol (COREG) 12.5 MG tablet Take 1 tablet (12.5 mg total) by mouth 2 (two) times daily with a meal.  180 tablet  5  . colesevelam (WELCHOL) 625 MG tablet 2 tablets twice a day  360 tablet  3  . lansoprazole (PREVACID) 30 MG capsule Take 30 mg by mouth daily. Pt takes one tablet every other day      . metFORMIN (GLUCOPHAGE) 1000 MG tablet Take 1 tablet (1,000 mg total) by mouth 2 (two) times daily with a meal.  180 tablet  3  . nitroGLYCERIN (NITROSTAT) 0.4 MG SL tablet Place 1 tablet (0.4 mg total) under the tongue every 5 (five) minutes as needed for chest pain.  25 tablet  5  . omega-3 acid ethyl esters (LOVAZA) 1 G capsule Take 2 g by mouth 2 (two) times daily.        . valsartan (DIOVAN) 160 MG tablet Take 1 tablet (160 mg total) by  mouth daily.  90 tablet  3  . [DISCONTINUED] Omeprazole Magnesium (PRILOSEC OTC PO) Take 1 capsule by mouth daily. Taking  Every other day       No current facility-administered medications for this visit.    Allergies  Allergen Reactions  . Beta Adrenergic Blockers     Patient Active Problem List   Diagnosis Date Noted  . Coronary artery disease     Priority: High  . Hypertension     Priority: High  . Type II or unspecified type diabetes mellitus without mention of complication, uncontrolled     Priority: Medium  . Hypercholesterolemia     Priority: Medium  . Malaise and fatigue 02/18/2013  . Ischemic heart disease     History  Smoking status  . Never Smoker   Smokeless tobacco  . Not on file    History  Alcohol Use No    Family History  Problem Relation Age of Onset  . Coronary artery disease Father     with multiple angioplasties  . Hypertension      runs in the family    Review of Systems: Constitutional: no fever chills diaphoresis or fatigue or change in weight.  Head and neck: no hearing loss, no epistaxis,  no photophobia or visual disturbance. Respiratory: No cough, shortness of breath or wheezing. Cardiovascular: No chest pain peripheral edema, palpitations. Gastrointestinal: No abdominal distention, no abdominal pain, no change in bowel habits hematochezia or melena. Genitourinary: No dysuria, no frequency, no urgency, no nocturia. Musculoskeletal:No arthralgias, no back pain, no gait disturbance or myalgias. Neurological: No dizziness, no headaches, no numbness, no seizures, no syncope, no weakness, no tremors. Hematologic: No lymphadenopathy, no easy bruising. Psychiatric: No confusion, no hallucinations, no sleep disturbance.    Physical Exam: Filed Vitals:   02/18/13 0940  BP: 122/72  Pulse: 80   general appearance reveals a well-developed well-nourished middle-aged gentleman in no distress.The head and neck exam reveals pupils equal and  reactive.  Extraocular movements are full.  There is no scleral icterus.  The mouth and pharynx are normal.  The neck is supple.  The carotids reveal no bruits.  The jugular venous pressure is normal.  The  thyroid is not enlarged.  There is no lymphadenopathy.  The chest is clear to percussion and auscultation.  There are no rales or rhonchi.  Expansion of the chest is symmetrical.  The precordium is quiet.  The first heart sound is normal.  The second heart sound is physiologically split.  There is no murmur gallop rub or click.  There is no abnormal lift or heave.  The abdomen is soft and nontender.  The bowel sounds are normal.  The liver and spleen are not enlarged.  There are no abdominal masses.  There are no abdominal bruits.  Extremities reveal good pedal pulses.  There is no phlebitis or edema.  There is no cyanosis or clubbing.  Strength is normal and symmetrical in all extremities.  There is no lateralizing weakness.  There are no sensory deficits.  The skin is warm and dry.  There is no rash.     Assessment / Plan: Continue same medication.  Lab work today pending.  Recheck in 4 months for office visit EKG lipid panel hepatic function panel basal metabolic panel and A1c

## 2013-02-18 NOTE — Assessment & Plan Note (Signed)
Blood pressure was remaining stable on current meds

## 2013-02-20 ENCOUNTER — Telehealth: Payer: Self-pay | Admitting: *Deleted

## 2013-02-20 DIAGNOSIS — E7800 Pure hypercholesterolemia, unspecified: Secondary | ICD-10-CM

## 2013-02-20 MED ORDER — COLESEVELAM HCL 625 MG PO TABS: ORAL_TABLET | ORAL | Status: DC

## 2013-02-20 NOTE — Telephone Encounter (Signed)
Advised patient of lab results and med change

## 2013-02-20 NOTE — Telephone Encounter (Signed)
Message copied by Burnell Blanks on Wed Feb 20, 2013  3:43 PM ------      Message from: Cassell Clement      Created: Mon Feb 18, 2013  9:24 PM       Please report. Cholesterol and LDL is still very high. I want him to increase Welchol to 6 tablets a day.  He can split them up or take them all at once.            Diabetes better with A1C down to 7.6      LFTs about the same, c/w fatty liver from diabetes. ------

## 2013-03-11 ENCOUNTER — Other Ambulatory Visit: Payer: Self-pay | Admitting: Cardiology

## 2013-03-12 ENCOUNTER — Encounter: Payer: Self-pay | Admitting: Cardiology

## 2013-05-15 ENCOUNTER — Telehealth: Payer: Self-pay | Admitting: *Deleted

## 2013-05-15 DIAGNOSIS — R5383 Other fatigue: Secondary | ICD-10-CM

## 2013-05-15 DIAGNOSIS — E7800 Pure hypercholesterolemia, unspecified: Secondary | ICD-10-CM

## 2013-05-15 DIAGNOSIS — IMO0001 Reserved for inherently not codable concepts without codable children: Secondary | ICD-10-CM

## 2013-05-15 NOTE — Telephone Encounter (Signed)
Spoke with patient and orders in for labs in December

## 2013-05-30 ENCOUNTER — Other Ambulatory Visit: Payer: Self-pay

## 2013-06-25 ENCOUNTER — Ambulatory Visit (INDEPENDENT_AMBULATORY_CARE_PROVIDER_SITE_OTHER): Payer: 59 | Admitting: Cardiology

## 2013-06-25 ENCOUNTER — Encounter: Payer: Self-pay | Admitting: Cardiology

## 2013-06-25 ENCOUNTER — Other Ambulatory Visit (INDEPENDENT_AMBULATORY_CARE_PROVIDER_SITE_OTHER): Payer: 59

## 2013-06-25 VITALS — BP 122/80 | HR 67 | Ht 69.0 in | Wt 170.0 lb

## 2013-06-25 DIAGNOSIS — R5381 Other malaise: Secondary | ICD-10-CM

## 2013-06-25 DIAGNOSIS — IMO0001 Reserved for inherently not codable concepts without codable children: Secondary | ICD-10-CM

## 2013-06-25 DIAGNOSIS — E7800 Pure hypercholesterolemia, unspecified: Secondary | ICD-10-CM

## 2013-06-25 DIAGNOSIS — I10 Essential (primary) hypertension: Secondary | ICD-10-CM

## 2013-06-25 DIAGNOSIS — I119 Hypertensive heart disease without heart failure: Secondary | ICD-10-CM

## 2013-06-25 DIAGNOSIS — R5383 Other fatigue: Secondary | ICD-10-CM

## 2013-06-25 LAB — HEPATIC FUNCTION PANEL
ALT: 76 U/L — ABNORMAL HIGH (ref 0–53)
AST: 40 U/L — ABNORMAL HIGH (ref 0–37)
Albumin: 4.3 g/dL (ref 3.5–5.2)
Alkaline Phosphatase: 33 U/L — ABNORMAL LOW (ref 39–117)
Bilirubin, Direct: 0 mg/dL (ref 0.0–0.3)
Total Bilirubin: 0.7 mg/dL (ref 0.3–1.2)
Total Protein: 7.6 g/dL (ref 6.0–8.3)

## 2013-06-25 LAB — CBC WITH DIFFERENTIAL/PLATELET
Basophils Relative: 1 % (ref 0.0–3.0)
Eosinophils Relative: 2.8 % (ref 0.0–5.0)
HCT: 45.2 % (ref 39.0–52.0)
Hemoglobin: 15.4 g/dL (ref 13.0–17.0)
Lymphs Abs: 2.2 10*3/uL (ref 0.7–4.0)
MCV: 88.4 fl (ref 78.0–100.0)
Monocytes Absolute: 0.5 10*3/uL (ref 0.1–1.0)
Monocytes Relative: 7.8 % (ref 3.0–12.0)
Neutro Abs: 3.5 10*3/uL (ref 1.4–7.7)
Platelets: 255 10*3/uL (ref 150.0–400.0)
RBC: 5.12 Mil/uL (ref 4.22–5.81)
WBC: 6.4 10*3/uL (ref 4.5–10.5)

## 2013-06-25 LAB — BASIC METABOLIC PANEL
BUN: 21 mg/dL (ref 6–23)
Calcium: 9.6 mg/dL (ref 8.4–10.5)
Creatinine, Ser: 1.1 mg/dL (ref 0.4–1.5)
GFR: 76.25 mL/min (ref >60.00)
Glucose, Bld: 139 mg/dL — ABNORMAL HIGH (ref 70–99)

## 2013-06-25 LAB — LIPID PANEL
Cholesterol: 305 mg/dL — ABNORMAL HIGH (ref 0–200)
Total CHOL/HDL Ratio: 7
VLDL: 39.2 mg/dL (ref 0.0–40.0)

## 2013-06-25 LAB — TSH: TSH: 1.02 u[IU]/mL (ref 0.35–5.50)

## 2013-06-25 MED ORDER — ROSUVASTATIN CALCIUM 5 MG PO TABS: 5.0000 mg | ORAL_TABLET | ORAL | Status: DC

## 2013-06-25 NOTE — Assessment & Plan Note (Signed)
Blood pressure has been remaining stable.  No headaches or dizzy spells or syncope

## 2013-06-25 NOTE — Patient Instructions (Signed)
Will obtain labs today and call you with the results (lp/bmet/hfp/cbc/a1c/tsh)  START CRESTOR 5 MG EVERY OTHER DAY, RX SENT TO PHARMACY  Your physician recommends that you schedule a follow-up appointment in: 4 months with fasting labs (lp/bmet/hfp/A1C)

## 2013-06-25 NOTE — Assessment & Plan Note (Signed)
The patient has not been having any chest pain or angina 

## 2013-06-25 NOTE — Assessment & Plan Note (Signed)
The patient's son has hypercholesterolemia and has been able to take a very small dose of Crestor.  The patient would like to try to do this himself.  We will start him out with 5 mg Crestor every other day and see if he can tolerate it.

## 2013-06-25 NOTE — Progress Notes (Signed)
Quick Note:  Please report to patient. The recent labs are stable. Continue same medication and careful diet. No anemia. Thyroid normal. A1C stable 7.6. LFTs stable. Cholesterol still very high. Hopefully the addition of small amount of crestor will help. ______

## 2013-06-25 NOTE — Assessment & Plan Note (Signed)
The patient is diabetic.  He has not been having any hypoglycemic episodes.  He frequently forgets to take his evening medicines including his metformin

## 2013-06-25 NOTE — Progress Notes (Signed)
Eric Ball Date of Birth:  11-10-1950 [address] [place], [place]  [address] [phone]         Fax   [phone]  History of Present Illness: This pleasant 62 year old gentleman is seen for a four-month followup office visit. He has a history of known ischemic heart disease. He underwent catheterization in 2004 at the time of his acute myocardial infarction and a stent was placed. The patient has done well since that time. He had a nuclear stress test in March 2009 which showed no evidence of ischemia and his ejection fraction was 70%. Patient also has a history of high blood pressure, diabetes, and hypercholesterolemia. Since last visit has had no new cardiac symptoms.  The patient states that he has felt more fatigued over the past several months.  He stays very busy.  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  He does not have any history of thyroid problems or anemia.  We will check TSH and CBC today.  He has been under more stress recently.  He is 54 year old mother fell and crushed the left side of her body and is in a nursing home.  The patient is having to stay with her every other night.   Current Outpatient Prescriptions  Medication Sig Dispense Refill  . aspirin 81 MG tablet Take 81 mg by mouth daily.        . carvedilol (COREG) 12.5 MG tablet Take 1 tablet (12.5 mg total) by mouth 2 (two) times daily with a meal.  180 tablet  5  . colesevelam (WELCHOL) 625 MG tablet 3 tablets twice a day  540 tablet  3  . DIOVAN 160 MG tablet TAKE 1 TABLET DAILY  90 tablet  3  . lansoprazole (PREVACID) 30 MG capsule Take 30 mg by mouth daily. Pt takes one tablet every other day      . metFORMIN (GLUCOPHAGE) 1000 MG tablet Take 1 tablet (1,000 mg total) by mouth 2 (two) times daily with a meal.  180 tablet  3  . nitroGLYCERIN (NITROSTAT) 0.4 MG SL tablet Place 1 tablet (0.4 mg total) under the  tongue every 5 (five) minutes as needed for chest pain.  25 tablet  5  . omega-3 acid ethyl esters (LOVAZA) 1 G capsule Take 2 g by mouth 2 (two) times daily.        . [DISCONTINUED] Omeprazole Magnesium (PRILOSEC OTC PO) Take 1 capsule by mouth daily. Taking  Every other day       No current facility-administered medications for this visit.    Allergies  Allergen Reactions  . Beta Adrenergic Blockers     Patient Active Problem List   Diagnosis Date Noted  . Coronary artery disease     Priority: High  . Hypertension     Priority: High  . Type II or unspecified type diabetes mellitus without mention of complication, uncontrolled     Priority: Medium  . Hypercholesterolemia     Priority: Medium  . Malaise and fatigue 02/18/2013  . Ischemic heart disease     History  Smoking status  . Never Smoker   Smokeless tobacco  . Not on file    History  Alcohol Use No    Family History  Problem Relation Age of Onset  . Coronary artery disease Father     with multiple angioplasties  . Hypertension  runs in the family    Review of Systems: Constitutional: no fever chills diaphoresis or fatigue or change in weight.  Head and neck: no hearing loss, no epistaxis, no photophobia or visual disturbance. Respiratory: No cough, shortness of breath or wheezing. Cardiovascular: No chest pain peripheral edema, palpitations. Gastrointestinal: No abdominal distention, no abdominal pain, no change in bowel habits hematochezia or melena. Genitourinary: No dysuria, no frequency, no urgency, no nocturia. Musculoskeletal:No arthralgias, no back pain, no gait disturbance or myalgias. Neurological: No dizziness, no headaches, no numbness, no seizures, no syncope, no weakness, no tremors. Hematologic: No lymphadenopathy, no easy bruising. Psychiatric: No confusion, no hallucinations, no sleep disturbance.    Physical Exam: Filed Vitals:   06/25/13 0907  BP: 122/80  Pulse: 67   general  appearance reveals a well-developed well-nourished middle-aged gentleman in no distress.The head and neck exam reveals pupils equal and reactive.  Extraocular movements are full.  There is no scleral icterus.  The mouth and pharynx are normal.  The neck is supple.  The carotids reveal no bruits.  The jugular venous pressure is normal.  The  thyroid is not enlarged.  There is no lymphadenopathy.  The chest is clear to percussion and auscultation.  There are no rales or rhonchi.  Expansion of the chest is symmetrical.  The precordium is quiet.  The first heart sound is normal.  The second heart sound is physiologically split.  There is no murmur gallop rub or click.  There is no abnormal lift or heave.  The abdomen is soft and nontender.  The bowel sounds are normal.  The liver and spleen are not enlarged.  There are no abdominal masses.  There are no abdominal bruits.  Extremities reveal good pedal pulses.  There is no phlebitis or edema.  There is no cyanosis or clubbing.  Strength is normal and symmetrical in all extremities.  There is no lateralizing weakness.  There are no sensory deficits.  The skin is warm and dry.  There is no rash.  EKG today shows normal sinus rhythm and no ischemic changes.  Assessment / Plan: Continue same medication.  We will start Crestor 5 mg every other day.  Lab work today pending.  Recheck in 4 months for office visit EKG lipid panel hepatic function panel basal metabolic panel and A1c

## 2013-06-26 ENCOUNTER — Telehealth: Payer: Self-pay | Admitting: *Deleted

## 2013-06-26 NOTE — Telephone Encounter (Signed)
Message copied by Burnell Blanks on Wed Jun 26, 2013  5:12 PM ------      Message from: Cassell Clement      Created: Tue Jun 25, 2013  8:43 PM       Please report to patient.  The recent labs are stable. Continue same medication and careful diet. No anemia. Thyroid normal. A1C stable 7.6. LFTs stable.   Cholesterol still very high. Hopefully the addition of small amount of crestor will help. ------

## 2013-06-26 NOTE — Telephone Encounter (Signed)
Advised patient of lab results  

## 2013-07-25 DIAGNOSIS — I2510 Atherosclerotic heart disease of native coronary artery without angina pectoris: Secondary | ICD-10-CM

## 2013-07-25 HISTORY — DX: Atherosclerotic heart disease of native coronary artery without angina pectoris: I25.10

## 2013-08-08 ENCOUNTER — Other Ambulatory Visit: Payer: Self-pay

## 2013-08-08 MED ORDER — CARVEDILOL 12.5 MG PO TABS: 12.5000 mg | ORAL_TABLET | Freq: Two times a day (BID) | ORAL | Status: DC

## 2013-10-16 ENCOUNTER — Ambulatory Visit (INDEPENDENT_AMBULATORY_CARE_PROVIDER_SITE_OTHER): Payer: 59 | Admitting: Cardiology

## 2013-10-16 ENCOUNTER — Other Ambulatory Visit: Payer: 59

## 2013-10-16 ENCOUNTER — Encounter: Payer: Self-pay | Admitting: Cardiology

## 2013-10-16 VITALS — BP 120/78 | HR 77 | Ht 69.5 in | Wt 174.0 lb

## 2013-10-16 DIAGNOSIS — I119 Hypertensive heart disease without heart failure: Secondary | ICD-10-CM

## 2013-10-16 DIAGNOSIS — R5383 Other fatigue: Secondary | ICD-10-CM

## 2013-10-16 DIAGNOSIS — I259 Chronic ischemic heart disease, unspecified: Secondary | ICD-10-CM

## 2013-10-16 DIAGNOSIS — I10 Essential (primary) hypertension: Secondary | ICD-10-CM

## 2013-10-16 DIAGNOSIS — R5381 Other malaise: Secondary | ICD-10-CM

## 2013-10-16 DIAGNOSIS — E7800 Pure hypercholesterolemia, unspecified: Secondary | ICD-10-CM

## 2013-10-16 DIAGNOSIS — IMO0001 Reserved for inherently not codable concepts without codable children: Secondary | ICD-10-CM

## 2013-10-16 DIAGNOSIS — E1165 Type 2 diabetes mellitus with hyperglycemia: Secondary | ICD-10-CM

## 2013-10-16 LAB — LIPID PANEL
Cholesterol: 232 mg/dL — ABNORMAL HIGH (ref 0–200)
HDL: 48.2 mg/dL (ref >39.00)
LDL Cholesterol: 147 mg/dL — ABNORMAL HIGH (ref 0–99)
Total CHOL/HDL Ratio: 5
Triglycerides: 182 mg/dL — ABNORMAL HIGH (ref 0.0–149.0)
VLDL: 36.4 mg/dL (ref 0.0–40.0)

## 2013-10-16 LAB — BASIC METABOLIC PANEL
BUN: 20 mg/dL (ref 6–23)
CHLORIDE: 100 meq/L (ref 96–112)
CO2: 24 mEq/L (ref 19–32)
Calcium: 9.6 mg/dL (ref 8.4–10.5)
Creatinine, Ser: 1.1 mg/dL (ref 0.4–1.5)
GFR: 71.44 mL/min (ref >60.00)
GLUCOSE: 167 mg/dL — AB (ref 70–99)
POTASSIUM: 4.5 meq/L (ref 3.5–5.1)
Sodium: 133 mEq/L — ABNORMAL LOW (ref 135–145)

## 2013-10-16 LAB — HEPATIC FUNCTION PANEL
ALT: 55 U/L — ABNORMAL HIGH (ref 0–53)
AST: 32 U/L (ref 0–37)
Albumin: 4.4 g/dL (ref 3.5–5.2)
Alkaline Phosphatase: 38 U/L — ABNORMAL LOW (ref 39–117)
BILIRUBIN DIRECT: 0.1 mg/dL (ref 0.0–0.3)
BILIRUBIN TOTAL: 0.6 mg/dL (ref 0.3–1.2)
Total Protein: 7.6 g/dL (ref 6.0–8.3)

## 2013-10-16 LAB — HEMOGLOBIN A1C: Hgb A1c MFr Bld: 8.2 % — ABNORMAL HIGH (ref 4.6–6.5)

## 2013-10-16 MED ORDER — OMEGA-3-ACID ETHYL ESTERS 1 G PO CAPS: 2.0000 g | ORAL_CAPSULE | Freq: Two times a day (BID) | ORAL | Status: DC

## 2013-10-16 MED ORDER — LANSOPRAZOLE 30 MG PO CPDR: DELAYED_RELEASE_CAPSULE | ORAL | Status: DC

## 2013-10-16 MED ORDER — NITROGLYCERIN 0.4 MG SL SUBL
0.4000 mg | SUBLINGUAL_TABLET | SUBLINGUAL | Status: DC | PRN
Start: 2013-10-16 — End: 2014-11-19

## 2013-10-16 MED ORDER — METFORMIN HCL 1000 MG PO TABS: 1000.0000 mg | ORAL_TABLET | Freq: Two times a day (BID) | ORAL | Status: DC

## 2013-10-16 MED ORDER — VALSARTAN 160 MG PO TABS: ORAL_TABLET | ORAL | Status: DC

## 2013-10-16 MED ORDER — COLESEVELAM HCL 625 MG PO TABS: ORAL_TABLET | ORAL | Status: DC

## 2013-10-16 NOTE — Assessment & Plan Note (Signed)
At his last visit we reintroduced a low dose of Crestor 5 mg every other day.  So far he is tolerating it without myalgias.  Blood work today is pending.

## 2013-10-16 NOTE — Progress Notes (Signed)
Quick Note:  Please report to patient. The recent labs are stable. Continue same medication and careful diet. The cholesterol is better. TG better. Risk ratio is better. Continue crestor. LFTs are stable. A1C is 8.2 higher. Lose weight, watch carbs ______

## 2013-10-16 NOTE — Progress Notes (Signed)
[name] Comment Date of Birth:  29-Jul-1950 [address] [place], [place]  [address] [phone]         Fax   [phone]  History of Present Illness: This pleasant 63 year old gentleman is seen for a four-month followup office visit. He has a history of known ischemic heart disease. He underwent catheterization in 2004 at the time of his acute myocardial infarction and a stent was placed. The patient has done well since that time. He had a nuclear stress test in March 2009 which showed no evidence of ischemia and his ejection fraction was 70%. Patient also has a history of high blood pressure, diabetes, and hypercholesterolemia. Since last visit has had no new cardiac symptoms.  .  He stays very busy.  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   Current Outpatient Prescriptions  Medication Sig Dispense Refill  . aspirin 81 MG tablet Take 81 mg by mouth daily.        . carvedilol (COREG) 12.5 MG tablet Take 1 tablet (12.5 mg total) by mouth 2 (two) times daily with a meal.  180 tablet  3  . colesevelam (WELCHOL) 625 MG tablet 3 tablets twice a day  540 tablet  3  . lansoprazole (PREVACID) 30 MG capsule Pt takes one tablet every other day  45 capsule  3  . metFORMIN (GLUCOPHAGE) 1000 MG tablet Take 1 tablet (1,000 mg total) by mouth 2 (two) times daily with a meal.  180 tablet  3  . nitroGLYCERIN (NITROSTAT) 0.4 MG SL tablet Place 1 tablet (0.4 mg total) under the tongue every 5 (five) minutes as needed for chest pain.  100 tablet  prn  . omega-3 acid ethyl esters (LOVAZA) 1 G capsule Take 2 capsules (2 g total) by mouth 2 (two) times daily.  360 capsule  3  . rosuvastatin (CRESTOR) 5 MG tablet Take 1 tablet (5 mg total) by mouth every other day.  45 tablet  3  . valsartan (DIOVAN) 160 MG tablet TAKE 1 TABLET DAILY  90 tablet  3  . [DISCONTINUED] Omeprazole Magnesium (PRILOSEC OTC PO) Take 1  capsule by mouth daily. Taking  Every other day       No current facility-administered medications for this visit.    Allergies  Allergen Reactions  . Beta Adrenergic Blockers     Patient Active Problem List   Diagnosis Date Noted  . Coronary artery disease     Priority: High  . Hypertension     Priority: High  . Type II or unspecified type diabetes mellitus without mention of complication, uncontrolled     Priority: Medium  . Hypercholesterolemia     Priority: Medium  . Malaise and fatigue 02/18/2013  . Ischemic heart disease     History  Smoking status  . Never Smoker   Smokeless tobacco  . Not on file    History  Alcohol Use No    Family History  Problem Relation Age of Onset  . Coronary artery disease Father     with multiple angioplasties  . Hypertension      runs in the family    Review of Systems: Constitutional: no fever chills diaphoresis or fatigue or change in weight.  Head and neck: no hearing loss, no epistaxis, no photophobia or visual disturbance. Respiratory: No cough, shortness of breath or wheezing. Cardiovascular: No chest pain peripheral edema,  palpitations. Gastrointestinal: No abdominal distention, no abdominal pain, no change in bowel habits hematochezia or melena. Genitourinary: No dysuria, no frequency, no urgency, no nocturia. Musculoskeletal:No arthralgias, no back pain, no gait disturbance or myalgias. Neurological: No dizziness, no headaches, no numbness, no seizures, no syncope, no weakness, no tremors. Hematologic: No lymphadenopathy, no easy bruising. Psychiatric: No confusion, no hallucinations, no sleep disturbance.    Physical Exam: Filed Vitals:   10/16/13 0815  BP: 120/78  Pulse: 77   general appearance reveals a well-developed well-nourished middle-aged gentleman in no distress.The head and neck exam reveals pupils equal and reactive.  Extraocular movements are full.  There is no scleral icterus.  The mouth and  pharynx are normal.  The neck is supple.  The carotids reveal no bruits.  The jugular venous pressure is normal.  The  thyroid is not enlarged.  There is no lymphadenopathy.  The chest is clear to percussion and auscultation.  There are no rales or rhonchi.  Expansion of the chest is symmetrical.  The precordium is quiet.  The first heart sound is normal.  The second heart sound is physiologically split.  There is no murmur gallop rub or click.  There is no abnormal lift or heave.  The abdomen is soft and nontender.  The bowel sounds are normal.  The liver and spleen are not enlarged.  There are no abdominal masses.  There are no abdominal bruits.  Extremities reveal good pedal pulses.  There is no phlebitis or edema.  There is no cyanosis or clubbing.  Strength is normal and symmetrical in all extremities.  There is no lateralizing weakness.  There are no sensory deficits.  The skin is warm and dry.  There is no rash.    Assessment / Plan: Continue same medication.  Continue Crestor 5 mg every other day.  Lab work today pending.  Recheck in 4 months for office visit EKG lipid panel hepatic function panel basal metabolic panel and V4Q.  Try to lose weight.

## 2013-10-16 NOTE — Patient Instructions (Addendum)
Will obtain labs today and call you with the results (lp/bmet/hfpA1c)  Work harder on diet and weight loss  Your physician recommends that you continue on your current medications as directed. Please refer to the Current Medication list given to you today.  Your physician wants you to follow-up in: 4 months with fasting labs (lp/bmet/hfp/A1c)  You will receive a reminder letter in the mail two months in advance. If you don't receive a letter, please call our office to schedule the follow-up appointment.

## 2013-10-16 NOTE — Assessment & Plan Note (Signed)
The patient has not been having any palpitations or symptoms of CHF.  No dizziness or syncope.

## 2013-10-16 NOTE — Assessment & Plan Note (Signed)
The patient stays physically active.  He goes to the and does the elliptical and a treadmill.  He has not been having any chest pain.  His energy level has improved since last visit.

## 2013-10-21 ENCOUNTER — Telehealth: Payer: Self-pay | Admitting: *Deleted

## 2013-10-21 NOTE — Telephone Encounter (Signed)
Message copied by Earvin Hansen on Mon Oct 21, 2013  9:01 AM ------      Message from: [name]      Created: Wed Oct 16, 2013  8:47 PM       Please report to patient.  The recent labs are stable. Continue same medication and careful diet. The cholesterol is better. TG better. Risk ratio is better. Continue crestor. LFTs are stable. A1C is 8.2 higher. Lose weight, watch carbs ------

## 2013-10-21 NOTE — Telephone Encounter (Signed)
Advised patient of lab results  

## 2013-11-12 ENCOUNTER — Telehealth: Payer: Self-pay | Admitting: Cardiology

## 2013-11-12 NOTE — Telephone Encounter (Signed)
Spoke with patient and he has been chest tightness/pain with exertion. Did schedule ov with  Dr. Mare Ferrari for tomorrow am. Advised patient if pain returns to go to ED, verbalized understanding.

## 2013-11-12 NOTE — Telephone Encounter (Signed)
New message      Pt does not feel well, want to ask some questions

## 2013-11-13 ENCOUNTER — Encounter: Payer: Self-pay | Admitting: Cardiology

## 2013-11-13 ENCOUNTER — Encounter (HOSPITAL_COMMUNITY): Payer: Self-pay | Admitting: Pharmacy Technician

## 2013-11-13 ENCOUNTER — Encounter: Payer: Self-pay | Admitting: *Deleted

## 2013-11-13 ENCOUNTER — Ambulatory Visit (INDEPENDENT_AMBULATORY_CARE_PROVIDER_SITE_OTHER): Payer: 59 | Admitting: Cardiology

## 2013-11-13 VITALS — BP 130/82 | HR 68 | Ht 69.5 in | Wt 174.0 lb

## 2013-11-13 DIAGNOSIS — I259 Chronic ischemic heart disease, unspecified: Secondary | ICD-10-CM

## 2013-11-13 DIAGNOSIS — E7800 Pure hypercholesterolemia, unspecified: Secondary | ICD-10-CM

## 2013-11-13 DIAGNOSIS — E1165 Type 2 diabetes mellitus with hyperglycemia: Secondary | ICD-10-CM

## 2013-11-13 DIAGNOSIS — IMO0001 Reserved for inherently not codable concepts without codable children: Secondary | ICD-10-CM

## 2013-11-13 DIAGNOSIS — I2510 Atherosclerotic heart disease of native coronary artery without angina pectoris: Secondary | ICD-10-CM

## 2013-11-13 DIAGNOSIS — I209 Angina pectoris, unspecified: Secondary | ICD-10-CM

## 2013-11-13 LAB — CBC WITH DIFFERENTIAL/PLATELET
Basophils Absolute: 0.1 10*3/uL (ref 0.0–0.1)
Basophils Relative: 0.9 % (ref 0.0–3.0)
Eosinophils Absolute: 0.3 10*3/uL (ref 0.0–0.7)
Eosinophils Relative: 4.7 % (ref 0.0–5.0)
HEMATOCRIT: 43.3 % (ref 39.0–52.0)
HEMOGLOBIN: 14.7 g/dL (ref 13.0–17.0)
LYMPHS ABS: 2 10*3/uL (ref 0.7–4.0)
LYMPHS PCT: 30.1 % (ref 12.0–46.0)
MCHC: 33.9 g/dL (ref 30.0–36.0)
MCV: 89.2 fl (ref 78.0–100.0)
MONOS PCT: 8.7 % (ref 3.0–12.0)
Monocytes Absolute: 0.6 10*3/uL (ref 0.1–1.0)
NEUTROS ABS: 3.7 10*3/uL (ref 1.4–7.7)
Neutrophils Relative %: 55.6 % (ref 43.0–77.0)
Platelets: 237 10*3/uL (ref 150.0–400.0)
RBC: 4.86 Mil/uL (ref 4.22–5.81)
RDW: 12.7 % (ref 11.5–14.6)
WBC: 6.7 10*3/uL (ref 4.5–10.5)

## 2013-11-13 LAB — BASIC METABOLIC PANEL
BUN: 16 mg/dL (ref 6–23)
CO2: 27 mEq/L (ref 19–32)
Calcium: 9.8 mg/dL (ref 8.4–10.5)
Chloride: 101 mEq/L (ref 96–112)
Creatinine, Ser: 0.9 mg/dL (ref 0.4–1.5)
GFR: 89.82 mL/min (ref >60.00)
Glucose, Bld: 176 mg/dL — ABNORMAL HIGH (ref 70–99)
Potassium: 4.9 mEq/L (ref 3.5–5.1)
SODIUM: 136 meq/L (ref 135–145)

## 2013-11-13 LAB — APTT: APTT: 28.4 s (ref 21.7–28.8)

## 2013-11-13 LAB — PROTIME-INR
INR: 1 ratio (ref 0.8–1.0)
Prothrombin Time: 11 s (ref 9.6–13.1)

## 2013-11-13 NOTE — Progress Notes (Signed)
[name] Comment Date of Birth:  03-25-1951 [address] [place], [place]  [address] [phone]        Fax   [phone]  History of Present Illness:  63-year-old gentleman is seen as a work in an office visit.  He has a history of known ischemic heart disease.  He had an acute lateral myocardial infarction in 2004 and underwent emergency cardiac catheterization with placement of a stent by Dr. Lyndel Safe.  Details of the cardiac catheterization are noted below: CARDIAC CATHETERIZATION  PROCEDURES PERFORMED:  1. Left heart catheterization.  2. Left ventriculogram.  3. Selective coronary angiography.  4. Primary stent placement, mid arteriovenous circumflex.  5. Perclose, right femoral artery.  DIAGNOSES:  1. Severe single-vessel coronary artery disease.  2. Mild left ventricular systolic dysfunction.  3. Acute lateral wall myocardial infarction.  HISTORY: The patient is a 63 year old gentleman with no prior cardiac  history who presents with three days of intermittent substernal chest  discomfort. This morning, the patient had severe onset of pain that was  unrelenting and he presented to the emergency room where he was found to  have transient ST elevation in the lateral leads. He is transferred to the  catheterization lab for urgent angiography.  TECHNIQUE: Informed consent was obtained, patient brought to the cardiac  catheterization lab, and a 7 French arterial and 6 French venous sheath was  placed in the right groin using the modified Seldinger technique. A 6  [place] and JR4 catheter was then used to engage the left and right  coronary arteries and selective angiography performed in various projections  using manual injections of contrast. A 6 French pigtail catheter was then  advanced to the left ventricle and left ventriculogram performed using power  injections of contrast in two views.  FINDINGS: Initial findings are as follows:  1. Left main trunk:  Large caliber vessel with mild irregularities.  2. LAD: This is a medium caliber vessel that provides a bifurcating first  diagonal branch in the mid section. The LAD has mild diffuse disease of  30% in the proximal segment and tubular narrowings of 40-50% in the mid  section. The diagonal branch has moderate diffuse disease of 50-60%.  3. Left circumflex artery: This is a medium caliber vessel that provides  two marginal branches in the mid section. The left circumflex system has  narrowings of 30% in the proximal segment. In the mid AV circumflex  there is a high-grade narrowing of 99% prior to the takeoff of the two  marginal branches. There is TIMI-2 flow distally.  4. RCA: Dominant. This is a large caliber vessel that provides the  posterior descending artery and a posterior ventricular branch in the  terminal segment. The right coronary has mild diffuse disease of 30% in  the mid section.  LEFT VENTRICULOGRAM: Normal end-systolic and end-diastolic dimensions.  Overall, left ventricular function appears mildly impaired with an ejection  fraction of approximately 45%. There is severe hypokinesis of the lateral  and atrial lateral walls. No mitral regurgitation is noted. LV pressure is  130/10, aortic is 130/90, LVEDP equals 30.  With these findings, we elected to proceed with percutaneous intervention to  the patent circumflex. The patient was given 600 mg of Plavix orally, 5000  units of heparin intravenously and ReoPro on a weight-adjusted basis to  maintain ACT of approximately 300 seconds. A 7 [place] guide catheter  was then used to engage the left coronary  artery and a 0.014 extra-support  wire advanced to the distal section of the second marginal branch. A 3.0 x  16 mm Express II stent was introduced, carefully positioned in the mid AV  circumflex and deployed at 16 atmospheres for 60 seconds. Angiography was  then performed after the administration of intracoronary  nitroglycerin  showing excellent result with no residual stenosis, full coverage of lesion  and TIMI-3 flow through the left circumflex system. Angiography was then  performed in various projections confirming these findings. The patient had  complete resolution obtained at the end of the intervention. The guide  catheter was then removed and Perclose suture closure device deployed to  the right femoral artery where the sheath was secured in position. The  patient was transferred to CCU in stable condition.  FINAL RESULTS: Successful stent placement to the mid arteriovenous  circumflex with reduction of 99% narrowing to 0% with placement of a 3.0 x  16 mm Express II stent with improvement of TIMI grade 2 to TIMI grade 3  flow The patient had been doing well until the past 10 days when he noted onset of precordial chest discomfort with exertion.  Typically he goes to the [facility] and works out.  He has had to shorten his workouts.  Yesterday he asked his father who was with him at the workout room at the bar a sublingual nitroglycerin and he used a nitroglycerin with prompt relief.  The patient has not been having any chest discomfort at rest.  The patient has a history of hypercholesterolemia and diabetes and hypertension.  He is under a lot of stress.  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 The patient has also been under stress because of some family stress issues regarding his adult children. Current Outpatient Prescriptions  Medication Sig Dispense Refill  . aspirin 81 MG tablet Take 81 mg by mouth daily.        . carvedilol (COREG) 12.5 MG tablet Take 1 tablet (12.5 mg total) by mouth 2 (two) times daily with a meal.  180 tablet  3  . metFORMIN (GLUCOPHAGE) 1000 MG tablet Take 1 tablet (1,000 mg total) by mouth 2 (two) times daily with a meal.  180 tablet  3  . nitroGLYCERIN (NITROSTAT) 0.4 MG SL tablet Place 1 tablet (0.4 mg total) under the tongue  every 5 (five) minutes as needed for chest pain.  100 tablet  prn  . omega-3 acid ethyl esters (LOVAZA) 1 G capsule Take 2 capsules (2 g total) by mouth 2 (two) times daily.  360 capsule  3  . rosuvastatin (CRESTOR) 5 MG tablet Take 1 tablet (5 mg total) by mouth every other day.  45 tablet  3  . colesevelam (WELCHOL) 625 MG tablet Take 1,875 mg by mouth 2 (two) times daily with a meal.      . lansoprazole (PREVACID) 30 MG capsule Take 30 mg by mouth every other day.      . valsartan (DIOVAN) 160 MG tablet Take 160 mg by mouth daily.      . [DISCONTINUED] Omeprazole Magnesium (PRILOSEC OTC PO) Take 1 capsule by mouth daily. Taking  Every other day       No current facility-administered medications for this visit.    Allergies  Allergen Reactions  . Beta Adrenergic Blockers Other (See Comments)    High doses make joints hurt.    Patient Active Problem List   Diagnosis Date  Noted  . Coronary artery disease     Priority: High  . Hypertension     Priority: High  . Type II or unspecified type diabetes mellitus without mention of complication, uncontrolled     Priority: Medium  . Hypercholesterolemia     Priority: Medium  . Angina pectoris 11/13/2013  . Malaise and fatigue 02/18/2013  . Ischemic heart disease     History  Smoking status  . Never Smoker   Smokeless tobacco  . Not on file    History  Alcohol Use No    Family History  Problem Relation Age of Onset  . Coronary artery disease Father     with multiple angioplasties  . Hypertension      runs in the family    Review of Systems: Constitutional: no fever chills diaphoresis or fatigue or change in weight.  Head and neck: no hearing loss, no epistaxis, no photophobia or visual disturbance. Respiratory: No cough, shortness of breath or wheezing. Cardiovascular: No chest pain peripheral edema, palpitations.  Recent exertional chest tightness relieved by rest. Gastrointestinal: No abdominal distention, no abdominal  pain, no change in bowel habits hematochezia or melena. Genitourinary: No dysuria, no frequency, no urgency, no nocturia. Musculoskeletal:No arthralgias, no back pain, no gait disturbance or myalgias. Neurological: No dizziness, no headaches, no numbness, no seizures, no syncope, no weakness, no tremors. Hematologic: No lymphadenopathy, no easy bruising. Psychiatric: No confusion, no hallucinations, no sleep disturbance.    Physical Exam: Filed Vitals:   11/13/13 0812  BP: 130/82  Pulse: 68   the general appearance reveals a well-developed well-nourished gentleman in no distress.The head and neck exam reveals pupils equal and reactive.  Extraocular movements are full.  There is no scleral icterus.  The mouth and pharynx are normal.  The neck is supple.  The carotids reveal no bruits.  The jugular venous pressure is normal.  The  thyroid is not enlarged.  There is no lymphadenopathy.  The chest is clear to percussion and auscultation.  There are no rales or rhonchi.  Expansion of the chest is symmetrical.  The precordium is quiet.  The first heart sound is normal.  The second heart sound is physiologically split.  There is no murmur gallop rub or click.  There is no abnormal lift or heave.  The abdomen is soft and nontender.  The bowel sounds are normal.  The liver and spleen are not enlarged.  There are no abdominal masses.  There are no abdominal bruits.  Extremities reveal good pedal pulses.  There is no phlebitis or edema.  There is no cyanosis or clubbing.  Strength is normal and symmetrical in all extremities.  There is no lateralizing weakness.  There are no sensory deficits.  The skin is warm and dry.  There is no rash.  EKG today at rest shows normal sinus rhythm and no ischemic changes.   Assessment / Plan: 1. ischemic heart disease with new onset exertional angina pectoris.  Remote history of express II stent to the mid circumflex in 2004 by Dr. Christy Sartorius. 2.  Diabetes mellitus 3.  Hypertension 4. Hypercholesterolemia  Plan: The patient is experiencing recurrent exertional angina.  We will arrange for her left heart cardiac catheterization and possible PCI for Friday, April 24 with [name].  The patient will hold his metformin.  We are obtaining preop labs today.

## 2013-11-13 NOTE — Patient Instructions (Addendum)
Will obtain labs today and call you with the results (BMET/CBC/PT/PTT)  YOU HAVE BEEN SCHEDULED FOR A HEART CATH ON Friday 11/15/13 AT 9:00 AM WITH DR Willis YOU NEED TO ARRIVE AT THE SHORT STAY, [place] AT 7:00 AM.  HOLD METFORMIN STARTING TODAY  Coronary Angiography Coronary angiography is an X-ray procedure used to look at the arteries in the heart. In this procedure, a dye (contrast dye) is injected through a long, hollow tube (catheter). The catheter is about the size of a piece of cooked spaghetti and is inserted through your groin, wrist, or arm. The dye is injected into each artery, and X-rays are then taken to show if there is a blockage in the arteries of your heart. LET [facility] CARE PROVIDER KNOW ABOUT:  Any allergies you have, including allergies to shellfish or contrast dye.   All medicines you are taking, including vitamins, herbs, eye drops, creams, and over-the-counter medicines.   Previous problems you or members of your family have had with the use of anesthetics.   Any blood disorders you have.   Previous surgeries you have had.  History of kidney problems or failure.   Other medical conditions you have. RISKS AND COMPLICATIONS  Generally, coronary angiography is a safe procedure. However, as with any procedure, complications can occur. Possible complications include:  Allergic reaction to the dye.  Bleeding from the access site or other locations.  Kidney injury, especially in people with impaired kidney function.  Stroke (rare).  Heart attack (rare). BEFORE THE PROCEDURE   Do not eat or drink anything after midnight the night before the procedure, or as directed by your health care provider.   Ask your health care provider if it is okay to take any needed medicines with a sip of water.  PROCEDURE  You may be given a medicine to help you relax (sedative) before the procedure. This medicine is given through an intravenous (IV)  access tube that is inserted into one of your veins.   The area where the catheter will be inserted is washed and shaved. This is usually done in the groin but may be done in the fold of your arm (near your elbow) or in the wrist.   A medicine will be given to numb the area where the catheter will be inserted (local anesthetic).   The health care provider will insert the catheter into an artery. The catheter is guided by using a special type of X-ray (fluoroscopy) of the blood vessel being examined.   A special dye is then injected into the catheter, and X-rays are taken. The dye helps to show where any narrowing or blockages are located in the heart arteries.  AFTER THE PROCEDURE   If the procedure is done through the leg, you will be kept in bed lying flat for several hours. You will be instructed to not bend or cross your legs.  The insertion site will be checked frequently.   The pulse in your feet or wrist will be checked frequently.   Additional blood tests, X-rays, and an electrocardiogram may be done.   You may need to stay in the hospital overnight for observation.  Document Released: 01/15/2003 Document Revised: 03/13/2013 Document Reviewed: 12/03/2012 [facility] Patient Information 2014 [place].

## 2013-11-14 ENCOUNTER — Telehealth: Payer: Self-pay | Admitting: *Deleted

## 2013-11-14 NOTE — Telephone Encounter (Signed)
Advised patient of lab results  

## 2013-11-14 NOTE — Telephone Encounter (Signed)
Message copied by Earvin Hansen on Thu Nov 14, 2013 12:32 PM ------      Message from: [name]      Created: Wed Nov 13, 2013  3:12 PM       Please report.  Pre-op labs are satisfactory.  Blood sugar 176.  Be very careful with sweets while off of metformin ------

## 2013-11-15 ENCOUNTER — Encounter (HOSPITAL_COMMUNITY)
Admission: RE | Disposition: A | Discharge status: Home / Self Care | Payer: 59 | Source: Ambulatory Visit | Attending: Cardiovascular Disease

## 2013-11-15 ENCOUNTER — Ambulatory Visit (HOSPITAL_COMMUNITY)
Admission: RE | Admit: 2013-11-15 | Discharge: 2013-11-16 | Disposition: A | Discharge status: Home / Self Care | Payer: 59 | Source: Ambulatory Visit | Attending: Cardiovascular Disease | Admitting: Cardiovascular Disease

## 2013-11-15 ENCOUNTER — Encounter (HOSPITAL_COMMUNITY): Payer: Self-pay | Admitting: General Practice

## 2013-11-15 DIAGNOSIS — I200 Unstable angina: Secondary | ICD-10-CM | POA: Diagnosis present

## 2013-11-15 DIAGNOSIS — E119 Type 2 diabetes mellitus without complications: Secondary | ICD-10-CM | POA: Insufficient documentation

## 2013-11-15 DIAGNOSIS — I2510 Atherosclerotic heart disease of native coronary artery without angina pectoris: Secondary | ICD-10-CM

## 2013-11-15 DIAGNOSIS — Z7982 Long term (current) use of aspirin: Secondary | ICD-10-CM | POA: Insufficient documentation

## 2013-11-15 DIAGNOSIS — E7800 Pure hypercholesterolemia, unspecified: Secondary | ICD-10-CM | POA: Insufficient documentation

## 2013-11-15 DIAGNOSIS — Z9861 Coronary angioplasty status: Secondary | ICD-10-CM | POA: Insufficient documentation

## 2013-11-15 DIAGNOSIS — E785 Hyperlipidemia, unspecified: Secondary | ICD-10-CM | POA: Insufficient documentation

## 2013-11-15 DIAGNOSIS — I10 Essential (primary) hypertension: Secondary | ICD-10-CM | POA: Insufficient documentation

## 2013-11-15 DIAGNOSIS — I252 Old myocardial infarction: Secondary | ICD-10-CM | POA: Insufficient documentation

## 2013-11-15 DIAGNOSIS — I259 Chronic ischemic heart disease, unspecified: Secondary | ICD-10-CM

## 2013-11-15 HISTORY — DX: Acute myocardial infarction, unspecified: I21.9

## 2013-11-15 HISTORY — DX: Adverse effect of unspecified anesthetic, initial encounter: T41.45XA

## 2013-11-15 HISTORY — DX: Nephrotic syndrome with unspecified morphologic changes: N04.9

## 2013-11-15 HISTORY — DX: Other complications of anesthesia, initial encounter: T88.59XA

## 2013-11-15 HISTORY — PX: CORONARY ANGIOPLASTY WITH STENT PLACEMENT: SHX49

## 2013-11-15 HISTORY — DX: Gastro-esophageal reflux disease without esophagitis: K21.9

## 2013-11-15 HISTORY — DX: Personal history of other diseases of the digestive system: Z87.19

## 2013-11-15 HISTORY — PX: LEFT HEART CATHETERIZATION WITH CORONARY ANGIOGRAM: SHX5451

## 2013-11-15 HISTORY — DX: Type 2 diabetes mellitus without complications: E11.9

## 2013-11-15 HISTORY — DX: Sleep apnea, unspecified: G47.30

## 2013-11-15 LAB — GLUCOSE, CAPILLARY
GLUCOSE-CAPILLARY: 221 mg/dL — AB (ref 70–99)
Glucose-Capillary: 198 mg/dL — ABNORMAL HIGH (ref 70–99)
Glucose-Capillary: 145 mg/dL — ABNORMAL HIGH (ref 70–99)
Glucose-Capillary: 140 mg/dL — ABNORMAL HIGH (ref 70–99)

## 2013-11-15 LAB — POCT ACTIVATED CLOTTING TIME: Activated Clotting Time: 265 seconds

## 2013-11-15 SURGERY — LEFT HEART CATHETERIZATION WITH CORONARY ANGIOGRAM
Anesthesia: LOCAL

## 2013-11-15 MED ORDER — SODIUM CHLORIDE 0.9 % IJ SOLN: 3.0000 mL | Freq: Two times a day (BID) | INTRAMUSCULAR | Status: DC

## 2013-11-15 MED ORDER — CLOPIDOGREL BISULFATE 75 MG PO TABS
75.0000 mg | ORAL_TABLET | Freq: Every day | ORAL | Status: DC
  Administered 2013-11-16: 75 mg via ORAL
  Filled 2013-11-15: qty 1

## 2013-11-15 MED ORDER — NITROGLYCERIN 0.4 MG SL SUBL: 0.4000 mg | SUBLINGUAL_TABLET | SUBLINGUAL | Status: DC | PRN

## 2013-11-15 MED ORDER — DIAZEPAM 5 MG PO TABS
5.0000 mg | ORAL_TABLET | ORAL | Status: AC
  Administered 2013-11-15: 5 mg via ORAL
  Filled 2013-11-15: qty 1

## 2013-11-15 MED ORDER — CLOPIDOGREL BISULFATE 300 MG PO TABS
ORAL_TABLET | ORAL | Status: AC
  Filled 2013-11-15: qty 2

## 2013-11-15 MED ORDER — HEPARIN SODIUM (PORCINE) 1000 UNIT/ML IJ SOLN
INTRAMUSCULAR | Status: AC
  Filled 2013-11-15: qty 1

## 2013-11-15 MED ORDER — LIDOCAINE HCL (PF) 1 % IJ SOLN
INTRAMUSCULAR | Status: AC
  Filled 2013-11-15: qty 30

## 2013-11-15 MED ORDER — OMEGA-3-ACID ETHYL ESTERS 1 G PO CAPS
2.0000 g | ORAL_CAPSULE | Freq: Two times a day (BID) | ORAL | Status: DC
  Administered 2013-11-15 (×2): 2 g via ORAL
  Filled 2013-11-15 (×4): qty 2

## 2013-11-15 MED ORDER — HEPARIN (PORCINE) IN NACL 2-0.9 UNIT/ML-% IJ SOLN
INTRAMUSCULAR | Status: AC
  Filled 2013-11-15: qty 1500

## 2013-11-15 MED ORDER — CARVEDILOL 12.5 MG PO TABS
12.5000 mg | ORAL_TABLET | Freq: Two times a day (BID) | ORAL | Status: DC
  Administered 2013-11-15: 12.5 mg via ORAL
  Filled 2013-11-15 (×3): qty 1

## 2013-11-15 MED ORDER — COLESEVELAM HCL 625 MG PO TABS
1875.0000 mg | ORAL_TABLET | Freq: Two times a day (BID) | ORAL | Status: DC
  Administered 2013-11-15: 1875 mg via ORAL
  Filled 2013-11-15 (×4): qty 3

## 2013-11-15 MED ORDER — MIDAZOLAM HCL 2 MG/2ML IJ SOLN
INTRAMUSCULAR | Status: AC
  Filled 2013-11-15: qty 2

## 2013-11-15 MED ORDER — OXYCODONE-ACETAMINOPHEN 5-325 MG PO TABS
1.0000 | ORAL_TABLET | ORAL | Status: DC | PRN
  Administered 2013-11-15: 14:00:00 1 via ORAL
  Filled 2013-11-15: qty 1

## 2013-11-15 MED ORDER — SODIUM CHLORIDE 0.9 % IV SOLN
INTRAVENOUS | Status: AC
Start: 2013-11-15 — End: 2013-11-15

## 2013-11-15 MED ORDER — PANTOPRAZOLE SODIUM 40 MG PO TBEC
40.0000 mg | DELAYED_RELEASE_TABLET | Freq: Every day | ORAL | Status: DC
Start: 2013-11-15 — End: 2013-11-16
  Administered 2013-11-15 – 2013-11-16 (×2): 40 mg via ORAL
  Filled 2013-11-15 (×2): qty 1

## 2013-11-15 MED ORDER — SODIUM CHLORIDE 0.9 % IV SOLN
INTRAVENOUS | Status: DC
  Administered 2013-11-15: 08:00:00 via INTRAVENOUS

## 2013-11-15 MED ORDER — SODIUM CHLORIDE 0.9 % IV SOLN: 250.0000 mL | INTRAVENOUS | Status: DC | PRN

## 2013-11-15 MED ORDER — BIVALIRUDIN 250 MG IV SOLR
INTRAVENOUS | Status: AC
  Filled 2013-11-15: qty 250

## 2013-11-15 MED ORDER — SODIUM CHLORIDE 0.9 % IJ SOLN: 3.0000 mL | INTRAMUSCULAR | Status: DC | PRN

## 2013-11-15 MED ORDER — ASPIRIN 81 MG PO CHEW: 81.0000 mg | CHEWABLE_TABLET | ORAL | Status: DC

## 2013-11-15 MED ORDER — VERAPAMIL HCL 2.5 MG/ML IV SOLN
INTRAVENOUS | Status: AC
  Filled 2013-11-15: qty 2

## 2013-11-15 MED ORDER — MORPHINE SULFATE 2 MG/ML IJ SOLN: 2.0000 mg | INTRAMUSCULAR | Status: DC | PRN

## 2013-11-15 MED ORDER — ASPIRIN 81 MG PO CHEW
81.0000 mg | CHEWABLE_TABLET | Freq: Every day | ORAL | Status: DC
Start: 2013-11-16 — End: 2013-11-16
  Filled 2013-11-15: qty 1

## 2013-11-15 MED ORDER — ATORVASTATIN CALCIUM 20 MG PO TABS
20.0000 mg | ORAL_TABLET | Freq: Every day | ORAL | Status: DC
  Filled 2013-11-15 (×2): qty 1

## 2013-11-15 MED ORDER — IRBESARTAN 150 MG PO TABS
150.0000 mg | ORAL_TABLET | Freq: Every day | ORAL | Status: DC
  Administered 2013-11-15: 14:00:00 150 mg via ORAL
  Filled 2013-11-15 (×2): qty 1

## 2013-11-15 MED ORDER — NITROGLYCERIN 0.2 MG/ML ON CALL CATH LAB
INTRAVENOUS | Status: AC
  Filled 2013-11-15: qty 1

## 2013-11-15 MED ORDER — FENTANYL CITRATE 0.05 MG/ML IJ SOLN
INTRAMUSCULAR | Status: AC
  Filled 2013-11-15: qty 2

## 2013-11-15 NOTE — Progress Notes (Signed)
TR BAND REMOVAL  LOCATION:  right radial  DEFLATED PER PROTOCOL:  yes  TIME BAND OFF / DRESSING APPLIED:   1630   SITE UPON ARRIVAL:   Level 0  SITE AFTER BAND REMOVAL:  Level 0  REVERSE ALLEN'S TEST:    positive  CIRCULATION SENSATION AND MOVEMENT:  Within Normal Limits  yes  COMMENTS:

## 2013-11-15 NOTE — H&P (View-Only) (Signed)
   Eric Ball Date of Birth:  08/22/1950 [address] [place], [place]  [address] [phone]        Fax   [phone]  History of Present Illness:  63-year-old gentleman is seen as a work in an office visit.  He has a history of known ischemic heart disease.  He had an acute lateral myocardial infarction in 2004 and underwent emergency cardiac catheterization with placement of a stent by Dr. Gupta.  Details of the cardiac catheterization are noted below: CARDIAC CATHETERIZATION  PROCEDURES PERFORMED:  1. Left heart catheterization.  2. Left ventriculogram.  3. Selective coronary angiography.  4. Primary stent placement, mid arteriovenous circumflex.  5. Perclose, right femoral artery.  DIAGNOSES:  1. Severe single-vessel coronary artery disease.  2. Mild left ventricular systolic dysfunction.  3. Acute lateral wall myocardial infarction.  HISTORY: The patient is a 51-year-old gentleman with no prior cardiac  history who presents with three days of intermittent substernal chest  discomfort. This morning, the patient had severe onset of pain that was  unrelenting and he presented to the emergency room where he was found to  have transient ST elevation in the lateral leads. He is transferred to the  catheterization lab for urgent angiography.  TECHNIQUE: Informed consent was obtained, patient brought to the cardiac  catheterization lab, and a 7 French arterial and 6 French venous sheath was  placed in the right groin using the modified Seldinger technique. A 6  French JL4 and JR4 catheter was then used to engage the left and right  coronary arteries and selective angiography performed in various projections  using manual injections of contrast. A 6 French pigtail catheter was then  advanced to the left ventricle and left ventriculogram performed using power  injections of contrast in two views.  FINDINGS: Initial findings are as follows:  1. Left main trunk:  Large caliber vessel with mild irregularities.  2. LAD: This is a medium caliber vessel that provides a bifurcating first  diagonal branch in the mid section. The LAD has mild diffuse disease of  30% in the proximal segment and tubular narrowings of 40-50% in the mid  section. The diagonal branch has moderate diffuse disease of 50-60%.  3. Left circumflex artery: This is a medium caliber vessel that provides  two marginal branches in the mid section. The left circumflex system has  narrowings of 30% in the proximal segment. In the mid AV circumflex  there is a high-grade narrowing of 99% prior to the takeoff of the two  marginal branches. There is TIMI-2 flow distally.  4. RCA: Dominant. This is a large caliber vessel that provides the  posterior descending artery and a posterior ventricular branch in the  terminal segment. The right coronary has mild diffuse disease of 30% in  the mid section.  LEFT VENTRICULOGRAM: Normal end-systolic and end-diastolic dimensions.  Overall, left ventricular function appears mildly impaired with an ejection  fraction of approximately 45%. There is severe hypokinesis of the lateral  and atrial lateral walls. No mitral regurgitation is noted. LV pressure is  130/10, aortic is 130/90, LVEDP equals 30.  With these findings, we elected to proceed with percutaneous intervention to  the patent circumflex. The patient was given 600 mg of Plavix orally, 5000  units of heparin intravenously and ReoPro on a weight-adjusted basis to  maintain ACT of approximately 300 seconds. A 7 French JL4 guide catheter  was then used to engage the left coronary   artery and a 0.014 extra-support  wire advanced to the distal section of the second marginal branch. A 3.0 x  16 mm Express II stent was introduced, carefully positioned in the mid AV  circumflex and deployed at 16 atmospheres for 60 seconds. Angiography was  then performed after the administration of intracoronary  nitroglycerin  showing excellent result with no residual stenosis, full coverage of lesion  and TIMI-3 flow through the left circumflex system. Angiography was then  performed in various projections confirming these findings. The patient had  complete resolution obtained at the end of the intervention. The guide  catheter was then removed and Perclose suture closure device deployed to  the right femoral artery where the sheath was secured in position. The  patient was transferred to CCU in stable condition.  FINAL RESULTS: Successful stent placement to the mid arteriovenous  circumflex with reduction of 99% narrowing to 0% with placement of a 3.0 x  16 mm Express II stent with improvement of TIMI grade 2 to TIMI grade 3  flow The patient had been doing well until the past 10 days when he noted onset of precordial chest discomfort with exertion.  Typically he goes to the [employer] and works out.  He has had to shorten his workouts.  Yesterday he asked his father who was with him at the workout room at the bar a sublingual nitroglycerin and he used a nitroglycerin with prompt relief.  The patient has not been having any chest discomfort at rest.  The patient has a history of hypercholesterolemia and diabetes and hypertension.  He is under a lot of stress.  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 The patient has also been under stress because of some family stress issues regarding his adult children. Current Outpatient Prescriptions  Medication Sig Dispense Refill  . aspirin 81 MG tablet Take 81 mg by mouth daily.        . carvedilol (COREG) 12.5 MG tablet Take 1 tablet (12.5 mg total) by mouth 2 (two) times daily with a meal.  180 tablet  3  . metFORMIN (GLUCOPHAGE) 1000 MG tablet Take 1 tablet (1,000 mg total) by mouth 2 (two) times daily with a meal.  180 tablet  3  . nitroGLYCERIN (NITROSTAT) 0.4 MG SL tablet Place 1 tablet (0.4 mg total) under the tongue  every 5 (five) minutes as needed for chest pain.  100 tablet  prn  . omega-3 acid ethyl esters (LOVAZA) 1 G capsule Take 2 capsules (2 g total) by mouth 2 (two) times daily.  360 capsule  3  . rosuvastatin (CRESTOR) 5 MG tablet Take 1 tablet (5 mg total) by mouth every other day.  45 tablet  3  . colesevelam (WELCHOL) 625 MG tablet Take 1,875 mg by mouth 2 (two) times daily with a meal.      . lansoprazole (PREVACID) 30 MG capsule Take 30 mg by mouth every other day.      . valsartan (DIOVAN) 160 MG tablet Take 160 mg by mouth daily.      . [DISCONTINUED] Omeprazole Magnesium (PRILOSEC OTC PO) Take 1 capsule by mouth daily. Taking  Every other day       No current facility-administered medications for this visit.    Allergies  Allergen Reactions  . Beta Adrenergic Blockers Other (See Comments)    High doses make joints hurt.    Patient Active Problem List   Diagnosis Date   Noted  . Coronary artery disease     Priority: High  . Hypertension     Priority: High  . Type II or unspecified type diabetes mellitus without mention of complication, uncontrolled     Priority: Medium  . Hypercholesterolemia     Priority: Medium  . Angina pectoris 11/13/2013  . Malaise and fatigue 02/18/2013  . Ischemic heart disease     History  Smoking status  . Never Smoker   Smokeless tobacco  . Not on file    History  Alcohol Use No    Family History  Problem Relation Age of Onset  . Coronary artery disease Father     with multiple angioplasties  . Hypertension      runs in the family    Review of Systems: Constitutional: no fever chills diaphoresis or fatigue or change in weight.  Head and neck: no hearing loss, no epistaxis, no photophobia or visual disturbance. Respiratory: No cough, shortness of breath or wheezing. Cardiovascular: No chest pain peripheral edema, palpitations.  Recent exertional chest tightness relieved by rest. Gastrointestinal: No abdominal distention, no abdominal  pain, no change in bowel habits hematochezia or melena. Genitourinary: No dysuria, no frequency, no urgency, no nocturia. Musculoskeletal:No arthralgias, no back pain, no gait disturbance or myalgias. Neurological: No dizziness, no headaches, no numbness, no seizures, no syncope, no weakness, no tremors. Hematologic: No lymphadenopathy, no easy bruising. Psychiatric: No confusion, no hallucinations, no sleep disturbance.    Physical Exam: Filed Vitals:   11/13/13 0812  BP: 130/82  Pulse: 68   the general appearance reveals a well-developed well-nourished gentleman in no distress.The head and neck exam reveals pupils equal and reactive.  Extraocular movements are full.  There is no scleral icterus.  The mouth and pharynx are normal.  The neck is supple.  The carotids reveal no bruits.  The jugular venous pressure is normal.  The  thyroid is not enlarged.  There is no lymphadenopathy.  The chest is clear to percussion and auscultation.  There are no rales or rhonchi.  Expansion of the chest is symmetrical.  The precordium is quiet.  The first heart sound is normal.  The second heart sound is physiologically split.  There is no murmur gallop rub or click.  There is no abnormal lift or heave.  The abdomen is soft and nontender.  The bowel sounds are normal.  The liver and spleen are not enlarged.  There are no abdominal masses.  There are no abdominal bruits.  Extremities reveal good pedal pulses.  There is no phlebitis or edema.  There is no cyanosis or clubbing.  Strength is normal and symmetrical in all extremities.  There is no lateralizing weakness.  There are no sensory deficits.  The skin is warm and dry.  There is no rash.  EKG today at rest shows normal sinus rhythm and no ischemic changes.   Assessment / Plan: 1. ischemic heart disease with new onset exertional angina pectoris.  Remote history of express II stent to the mid circumflex in 2004 by Dr. Christy Sartorius. 2.  Diabetes mellitus 3.  Hypertension 4. Hypercholesterolemia  Plan: The patient is experiencing recurrent exertional angina.  We will arrange for her left heart cardiac catheterization and possible PCI for Friday, April 24 with [name].  The patient will hold his metformin.  We are obtaining preop labs today.

## 2013-11-15 NOTE — Interval H&P Note (Signed)
History and Physical Interval Note:  11/15/2013 9:09 AM  Eric Ball  has presented today for cardiac cath with the diagnosis of cp  The various methods of treatment have been discussed with the patient and family. After consideration of risks, benefits and other options for treatment, the patient has consented to  Procedure(s): LEFT HEART CATHETERIZATION WITH CORONARY ANGIOGRAM (N/A) as a surgical intervention .  The patient's history has been reviewed, patient examined, no change in status, stable for surgery.  I have reviewed the patient's chart and labs.  Questions were answered to the patient's satisfaction.    Cath Lab Visit (complete for each Cath Lab visit)  Clinical Evaluation Leading to the Procedure:   ACS: no  Non-ACS:    Anginal Classification: CCS III  Anti-ischemic medical therapy: Minimal Therapy (1 class of medications)  Non-Invasive Test Results: No non-invasive testing performed  Prior CABG: No previous CABG        Burnell Blanks

## 2013-11-15 NOTE — CV Procedure (Signed)
Cardiac Catheterization Operative Report  Eric Ball [id number] 4/24/201510:27 AM [name], MD  Procedure Performed:  1. Left Heart Catheterization 2. Selective Coronary Angiography 3. Left ventricular angiogram\ 4. PTCA/DES x 1 mid Circumflex  Operator: Lauree Chandler, MD  Arterial access site:  Right radial artery.   Indication:  63 yo male with history of CAD (Bare metal stent mid Circumflex 2004), HTN, HLD with recent chest pain c/w class III unstable angina.                                      Procedure Details: The risks, benefits, complications, treatment options, and expected outcomes were discussed with the patient. The patient and/or family concurred with the proposed plan, giving informed consent. The patient was brought to the cath lab after IV hydration was begun and oral premedication was given. The patient was further sedated with Versed and Fentanyl. The right wrist was assessed with an Allens test which was positive. The right wrist was prepped and draped in a sterile fashion. 1% lidocaine was used for local anesthesia. Using the modified Seldinger access technique, a 5 French sheath was placed in the right radial artery. 3 mg Verapamil was given through the sheath. 4000 units IV heparin was given. I engaged the RCA with a AR-1 catheter and the left main with a JL3.5 catheter. Selective coronary angiography was performed. A pigtail catheter was used to perform a left ventricular angiogram. He was found to have a severe stenosis in the previously stented segment of the Circumflex. I elected to proceed to PCI.   PCI Note: The patient was given an additional 5000 units IV heparin (total 9000 units). He was given Plavix 600 mg po x 1. When the ACT was over 250, I engaged the left main with a XB 3.5 guiding catheter. The left main was very short and the catheter selectively engaged the LAD. I passed a Cougar IC wire down the LAD. I then pulled the  catheter back to the aorta and I passed a Whisper wire down the Circumflex. I then used a 2.5 x 15 mm balloon to pre-dilate the severe stenosis in the mid Circumflex. A 3.0 x 26 mm Resolute Integrity DES was deployed in the mid Circumflex completely covering the old bare metal stent. The stent was post dilated with a 3.25 x 15 mm Pondera balloon x 2. The stenosis was taken from 99% down to 0%.   The sheath was removed from the right radial artery and a Terumo hemostasis band was applied at the arteriotomy site on the right wrist. There were no immediate complications. The patient was taken to the recovery area in stable condition.   Hemodynamic Findings: Central aortic pressure: 104/60 Left ventricular pressure: 108/5/10  Angiographic Findings:  Left main: Short segment, no obstructive disease.   Left Anterior Descending Artery: Moderate caliber vessel that courses to the apex. The proximal vessel has diffuse 20% stenosis. The mid and distal vessel has diffuse mild plaque. The distal vessel becomes very small in caliber. The diagonal branch is moderate in caliber with diffuse 30% stenosis.   Circumflex Artery: Moderate caliber vessel with 40% proximal stenosis. There is a mid stented segment with diffuse 99% stent restenosis. The very small caliber first OM branch is very small in caliber and is jailed by the stent with ostial 90% stenosis. The second OM branch is patent with  diffuse 20% stenosis.   Right Coronary Artery: Large caliber dominant vessel with 60% mid stenosis, mild luminal irregularities in the distal vessel. The PDA is very small in caliber with mild plaque. The posterolateral segment is small in caliber with diffuse mild plaque.   Left Ventricular Angiogram: LVEF=65%  Impression: 1. Double vessel CAD with severe stent restenosis mid Circumflex, moderate stenosis mid RCA 2. Class III unstable angina 3. Successful PTCA/DES x 1 mid Circumflex 4. Normal LV function  Recommendations:  He will need dual anti-platelet therapy with ASA and Plavix for one year. Continue other medical therapy. Probable discharge in am.        Complications:  None. The patient tolerated the procedure well.

## 2013-11-15 NOTE — Care Management Note (Addendum)
  Page 1 of 1   11/15/2013     12:13:03 PM CARE MANAGEMENT NOTE 11/15/2013  Patient:  Eric Ball, Eric Ball   Account Number:  [id number]  Date Initiated:  11/15/2013  Documentation initiated by:  Mariann Laster  Subjective/Objective Assessment:   Presented to ED with CP     Action/Plan:   CM to follow for dispositon needs   Anticipated DC Date:  11/16/2013   Anticipated DC Plan:  HOME/SELF CARE         Choice offered to / List presented to:             Status of service:  Completed, signed off Medicare Important Message given?   (If response is "NO", the following Medicare IM given date fields will be blank) Date Medicare IM given:   Date Additional Medicare IM given:    Discharge Disposition:  HOME/SELF CARE  Per UR Regulation:    If discussed at Long Length of Stay Meetings, dates discussed:    Comments:  11/15/2013 Left heart cath 11/15/2013 Med review:  Plavix No other CM needs identified at this time. Dispositon Plan:  Home / [place] RN,  BSN, M[employer], CCM

## 2013-11-16 ENCOUNTER — Encounter (HOSPITAL_COMMUNITY): Payer: Self-pay | Admitting: Adult Health

## 2013-11-16 DIAGNOSIS — I259 Chronic ischemic heart disease, unspecified: Secondary | ICD-10-CM

## 2013-11-16 DIAGNOSIS — I2510 Atherosclerotic heart disease of native coronary artery without angina pectoris: Secondary | ICD-10-CM

## 2013-11-16 LAB — CBC
HEMATOCRIT: 42.8 % (ref 39.0–52.0)
HEMOGLOBIN: 14.2 g/dL (ref 13.0–17.0)
MCH: 30 pg (ref 26.0–34.0)
MCHC: 33.2 g/dL (ref 30.0–36.0)
MCV: 90.3 fL (ref 78.0–100.0)
Platelets: 211 10*3/uL (ref 150–400)
RBC: 4.74 MIL/uL (ref 4.22–5.81)
RDW: 12.7 % (ref 11.5–15.5)
WBC: 6.9 10*3/uL (ref 4.0–10.5)

## 2013-11-16 LAB — BASIC METABOLIC PANEL
BUN: 16 mg/dL (ref 6–23)
CHLORIDE: 102 meq/L (ref 96–112)
CO2: 25 mEq/L (ref 19–32)
CREATININE: 1.01 mg/dL (ref 0.50–1.35)
Calcium: 9.7 mg/dL (ref 8.4–10.5)
GFR calc Af Amer: >90 mL/min (ref >90)
GFR, EST NON AFRICAN AMERICAN: 78 mL/min — AB (ref >90)
Glucose, Bld: 158 mg/dL — ABNORMAL HIGH (ref 70–99)
Potassium: 5 mEq/L (ref 3.7–5.3)
Sodium: 142 mEq/L (ref 137–147)

## 2013-11-16 LAB — GLUCOSE, CAPILLARY: Glucose-Capillary: 165 mg/dL — ABNORMAL HIGH (ref 70–99)

## 2013-11-16 MED ORDER — CLOPIDOGREL BISULFATE 75 MG PO TABS: 75.0000 mg | ORAL_TABLET | Freq: Every day | ORAL | Status: DC

## 2013-11-16 MED ORDER — PANTOPRAZOLE SODIUM 40 MG PO TBEC: 40.0000 mg | DELAYED_RELEASE_TABLET | Freq: Every day | ORAL | Status: DC

## 2013-11-16 MED ORDER — ASPIRIN 81 MG PO CHEW: 81.0000 mg | CHEWABLE_TABLET | Freq: Every day | ORAL | Status: DC

## 2013-11-16 NOTE — Discharge Summary (Signed)
Physician Discharge Summary  Patient ID: RANA ADORNO MRN: [id number] DOB/AGE: Jan 22, 1951 63 y.o.  Admit date: 11/15/2013 Discharge date: 11/16/2013  Primary Discharge Diagnosis:  1.CAD: S/P PTCA/DES X1 to Mid Cx. 2. Prior BMS to mid Cx 2004  Secondary Discharge Diagnosis 1. Hypertension 2. Hyperlipidemia 3. Diabetes  Primary Cardiologist: [name] MD  Significant Diagnostic Studies: 1. Cardiac Cath with PCI to Circumflex  1. Double vessel CAD with severe stent restenosis mid Circumflex, moderate stenosis mid RCA  2. Class III unstable angina  3. Successful PTCA/DES x 1 mid Circumflex  4. Normal LV function  Consults: None  Hospital Course:     Mr. Schertzer is a 63 year old patient of Dr. Mare Ferrari do is plan for cardiac catheterization with a history of known ischemic heart disease, with a history of an acute lateral myocardial infarction 2004 was prior bare-metal stents to the mid circumflex by [name]. The patient had a sudden onset of precordial chest pain with exertion. The patient is normally works out at [employer] and had shortness workouts due to the recurrent pain. He is plan for cardiac catheterization.    Cardiac catheterization was completed by [name] on 11/15/2013 demonstrating severe in-stent restenosis of the mid circumflex and 99%. Intervention was completed using a Resolute Integrity DES. He was recommended for dual antiplatelet therapy with aspirin and Plavix for one year, and to continue other medications prior to admission.    The patient tolerated the procedure well without any complaints of bleeding or swelling at the catheter insertion site of the right wrist. The patient was seen and examined by myself and Dr. Rayann Heman on morning of discharge and was found to be stable. He will return home with followup appointment previously scheduled with Dr. Mare Ferrari in May of 2015.    Of note, the patient had had issues with Plavix in the past causing  nosebleeds, I have asked him to address this with Dr. Mare Ferrari on followup. In the past patient had a bare-metal stent, and now has a drug-eluting stent, the need to have dual antiplatelet therapy for a minimum of one year. This has been explained to the patient. He verbalizes understanding. Any issues with bleeding will be discussed on followup appointment.            Discharge Exam: Blood pressure 131/82, pulse 70, temperature 98.6 F (37 C), temperature source Oral, resp. rate 18, height 5\' 10"  (1.778 m), weight 174 lb 13.2 oz (79.3 kg), SpO2 97.00%.   General: Well developed, well nourished, in no acute distress Head: Eyes PERRLA, No xanthomas.   Normal cephalic and atramatic  Lungs: Clear bilaterally to auscultation and percussion. Heart: HRRR S1 S2, without MRG.  Pulses are 2+ & equal.            No carotid bruit. No JVD.  No abdominal bruits. No femoral bruits. Abdomen: Bowel sounds are positive, abdomen soft and non-tender without masses or                  Hernia's noted. Msk:  Back normal, normal gait. Normal strength and tone for age. Extremities: No clubbing, cyanosis or edema. Right wrist catheter insertion site healthy without evidence of Bleeding, hematoma, or infection.  DP +1 Neuro: Alert and oriented X 3. Psych:  Good affect, responds appropriately  Labs:   Lab Results  Component Value Date   WBC 6.9 11/16/2013   HGB 14.2 11/16/2013   HCT 42.8 11/16/2013   MCV 90.3 11/16/2013  PLT 211 11/16/2013     Recent Labs Lab 11/13/13 0857  NA 136  K 4.9  CL 101  CO2 27  BUN 16  CREATININE 0.9  CALCIUM 9.8  GLUCOSE 176*   No results found for this basename: CKTOTAL,  CKMB,  CKMBINDEX,  TROPONINI    Lab Results  Component Value Date   CHOL 232* 10/16/2013   CHOL 305* 06/25/2013   CHOL 317* 02/18/2013   Lab Results  Component Value Date   HDL 48.20 10/16/2013   HDL 43.00 06/25/2013   HDL 43.10 02/18/2013   Lab Results  Component Value Date   LDLCALC 147*  10/16/2013   Lab Results  Component Value Date   TRIG 182.0* 10/16/2013   TRIG 196.0* 06/25/2013   TRIG 285.0* 02/18/2013   Lab Results  Component Value Date   CHOLHDL 5 10/16/2013   CHOLHDL 7 06/25/2013   CHOLHDL 7 02/18/2013   Lab Results  Component Value Date   LDLDIRECT 232.3 06/25/2013   LDLDIRECT 224.4 02/18/2013   LDLDIRECT 189.8 10/10/2012    .  EKG:NSR with rate of 66 bpm.  FOLLOW [place] APPOINTMENTS     Discharge Orders   Future Appointments Provider Department Dept Phone   11/28/2013 9:30 AM [name], MD [facility] [phone]   Future Orders Complete By Expires   Diet - low sodium heart healthy  As directed    Increase activity slowly  As directed        Medication List         aspirin 81 MG chewable tablet  Chew 1 tablet (81 mg total) by mouth daily.     aspirin 81 MG tablet  Take 81 mg by mouth daily.     carvedilol 12.5 MG tablet  Commonly known as:  COREG  Take 1 tablet (12.5 mg total) by mouth 2 (two) times daily with a meal.     clopidogrel 75 MG tablet  Commonly known as:  PLAVIX  Take 1 tablet (75 mg total) by mouth daily with breakfast.     colesevelam 625 MG tablet  Commonly known as:  WELCHOL  Take 1,875 mg by mouth 2 (two) times daily with a meal.     lansoprazole 30 MG capsule  Commonly known as:  PREVACID  Take 30 mg by mouth every other day.     metFORMIN 1000 MG tablet  Commonly known as:  GLUCOPHAGE  Take 1 tablet (1,000 mg total) by mouth 2 (two) times daily with a meal.     nitroGLYCERIN 0.4 MG SL tablet  Commonly known as:  NITROSTAT  Place 1 tablet (0.4 mg total) under the tongue every 5 (five) minutes as needed for chest pain.     omega-3 acid ethyl esters 1 G capsule  Commonly known as:  LOVAZA  Take 2 capsules (2 g total) by mouth 2 (two) times daily.     rosuvastatin 5 MG tablet  Commonly known as:  CRESTOR  Take 1 tablet (5 mg total) by mouth every other day.     valsartan 160 MG  tablet  Commonly known as:  DIOVAN  Take 160 mg by mouth daily.         Time spent with patient to include physician time:30 minutes.   Signed: Lendon Colonel 11/16/2013, 7:27 AM Co-Sign MD  I have seen, examined the patient, and reviewed the above.  Changes to above are made where necessary.    Co Sign:  Thompson Grayer, MD 11/16/2013 8:18 AM    Thompson Grayer MD

## 2013-11-16 NOTE — Progress Notes (Signed)
CARDIAC REHAB PHASE I   PRE:  Rate/Rhythm: 70 sinus  BP:  Supine: 125/77   SaO2: 96 RA  MODE:  Ambulation: 500 ft   POST:  Rate/Rhythem: 80 sinus  BP:  Supine: 142/85  Pt ambulated 500 ft with assist x1.  Pt had no complaints during walk and tolerated walk well.  Pt returned to bed after walk with call bell in reach.  Reviewed d/c education with pt discussing: stent/Plavix, NTG use, calling 911/MD, restrictions, with focus on diet and exercise.  Pt stated that he knew what to eat but just chose not to always follow along the guidelines.  He is an avid exerciser at [employer].  Pt was interested in Cardiac Rehab Phase II for the diet portion in particular.  Pt encouraged to contact insurance to verify coverage.  Pt voiced understanding.  Pt referral will be sent to Phase II at [facility].   [name], MA, ACSM RCEP [phone]  [name]

## 2013-11-18 MED FILL — Dextrose Inj 5%: INTRAVENOUS | Qty: 50 | Status: AC

## 2013-11-18 MED FILL — Bivalirudin Trifluoroacetate For IV Soln 250 MG (Base Equiv): INTRAVENOUS | Qty: 250 | Status: AC

## 2013-11-28 ENCOUNTER — Encounter: Payer: Self-pay | Admitting: Cardiology

## 2013-11-28 ENCOUNTER — Ambulatory Visit (INDEPENDENT_AMBULATORY_CARE_PROVIDER_SITE_OTHER): Payer: 59 | Admitting: Cardiology

## 2013-11-28 VITALS — BP 126/74 | HR 78 | Ht 70.0 in | Wt 172.0 lb

## 2013-11-28 DIAGNOSIS — I2510 Atherosclerotic heart disease of native coronary artery without angina pectoris: Secondary | ICD-10-CM

## 2013-11-28 DIAGNOSIS — I10 Essential (primary) hypertension: Secondary | ICD-10-CM

## 2013-11-28 DIAGNOSIS — E7800 Pure hypercholesterolemia, unspecified: Secondary | ICD-10-CM

## 2013-11-28 DIAGNOSIS — E1165 Type 2 diabetes mellitus with hyperglycemia: Secondary | ICD-10-CM

## 2013-11-28 DIAGNOSIS — I119 Hypertensive heart disease without heart failure: Secondary | ICD-10-CM

## 2013-11-28 DIAGNOSIS — I259 Chronic ischemic heart disease, unspecified: Secondary | ICD-10-CM

## 2013-11-28 DIAGNOSIS — IMO0001 Reserved for inherently not codable concepts without codable children: Secondary | ICD-10-CM

## 2013-11-28 NOTE — Assessment & Plan Note (Signed)
Blood pressure has remained stable post catheter.  No headaches or dizziness.

## 2013-11-28 NOTE — Assessment & Plan Note (Signed)
No further chest pain or angina.  He may resume full activity at this time.  He will need a dual antiplatelet therapy for one year because of his drug-eluting stent.

## 2013-11-28 NOTE — Progress Notes (Signed)
[name] Comment Date of Birth:  07-31-1950 [facility] [address] [place],   [address] [phone]        Fax   [phone]   History of Present Illness: This pleasant 63 year old gentleman is seen for a scheduled post hospital followup office visit.  He has a past history of known coronary disease, with a history of an acute lateral myocardial infarction 2004 followed by cardiac catheterization and PCI with prior bare-metal stents to the mid circumflex by [name].  He had done well in the ensuing 10 years until recently when he began to experience crescendo angina pectoris.  He was hospitalized on 11/15/13 for cardiac catheterization. Cardiac catheterization was completed by [name] on 11/15/2013 demonstrating severe in-stent restenosis of the mid circumflex and 99%. Intervention was completed using a Resolute Integrity DES. He was recommended for dual antiplatelet therapy with aspirin and Plavix for one year, and to continue other medications prior to admission.  The patient tolerated the procedure well without any complaints of bleeding or swelling at the catheter insertion site of the right wrist.  Since discharge the patient has had no recurrence of his angina pectoris. Of note, the patient had had issues with Plavix in the past causing nosebleeds, the patient has not had any recurrent problems with epistaxis this time. Other medical conditions include diabetes, hypertension, and hypercholesterolemia.  Current Outpatient Prescriptions  Medication Sig Dispense Refill  . aspirin 81 MG tablet Take 81 mg by mouth daily.        . carvedilol (COREG) 12.5 MG tablet Take 1 tablet (12.5 mg total) by mouth 2 (two) times daily with a meal.  180 tablet  3  . clopidogrel (PLAVIX) 75 MG tablet Take 1 tablet (75 mg total) by mouth daily with breakfast.  30 tablet  6  . colesevelam (WELCHOL) 625 MG tablet Take 1,875 mg by mouth 2 (two) times daily with a meal.        . metFORMIN (GLUCOPHAGE) 1000 MG tablet Take 1 tablet (1,000 mg total) by mouth 2 (two) times daily with a meal.  180 tablet  3  . nitroGLYCERIN (NITROSTAT) 0.4 MG SL tablet Place 1 tablet (0.4 mg total) under the tongue every 5 (five) minutes as needed for chest pain.  100 tablet  prn  . omega-3 acid ethyl esters (LOVAZA) 1 G capsule Take 2 capsules (2 g total) by mouth 2 (two) times daily.  360 capsule  3  . pantoprazole (PROTONIX) 40 MG tablet Take 1 tablet (40 mg total) by mouth daily.  30 tablet  10  . rosuvastatin (CRESTOR) 5 MG tablet Take 1 tablet (5 mg total) by mouth every other day.  45 tablet  3  . valsartan (DIOVAN) 160 MG tablet Take 160 mg by mouth daily.      . [DISCONTINUED] Omeprazole Magnesium (PRILOSEC OTC PO) Take 1 capsule by mouth daily. Taking  Every other day       No current facility-administered medications for this visit.    Allergies  Allergen Reactions  . Beta Adrenergic Blockers Other (See Comments)    High doses make joints hurt.    Patient Active Problem List   Diagnosis Date Noted  . Coronary artery disease     Priority: High  . Hypertension     Priority: High  . Type II or unspecified type diabetes mellitus without mention of complication, uncontrolled     Priority: Medium  . Hypercholesterolemia  Priority: Medium  . Unstable angina 11/15/2013  . Angina pectoris 11/13/2013  . Malaise and fatigue 02/18/2013  . Ischemic heart disease     History  Smoking status  . Never Smoker   Smokeless tobacco  . Never Used    History  Alcohol Use No    Family History  Problem Relation Age of Onset  . Coronary artery disease Father     with multiple angioplasties  . Hypertension      runs in the family    Review of Systems: Constitutional: no fever chills diaphoresis or fatigue or change in weight.  Head and neck: no hearing loss, no epistaxis, no photophobia or visual disturbance. Respiratory: No cough, shortness of breath or  wheezing. Cardiovascular: No chest pain peripheral edema, palpitations. Gastrointestinal: No abdominal distention, no abdominal pain, no change in bowel habits hematochezia or melena. Genitourinary: No dysuria, no frequency, no urgency, no nocturia. Musculoskeletal:No arthralgias, no back pain, no gait disturbance or myalgias. Neurological: No dizziness, no headaches, no numbness, no seizures, no syncope, no weakness, no tremors. Hematologic: No lymphadenopathy, no easy bruising. Psychiatric: No confusion, no hallucinations, no sleep disturbance.    Physical Exam: Filed Vitals:   11/28/13 0925  BP: 126/74  Pulse: 78   the general appearance reveals a well-developed well-nourished gentleman in no distress.The head and neck exam reveals pupils equal and reactive.  Extraocular movements are full.  There is no scleral icterus.  The mouth and pharynx are normal.  The neck is supple.  The carotids reveal no bruits.  The jugular venous pressure is normal.  The  thyroid is not enlarged.  There is no lymphadenopathy.  The chest is clear to percussion and auscultation.  There are no rales or rhonchi.  Expansion of the chest is symmetrical.  The precordium is quiet.  The first heart sound is normal.  The second heart sound is physiologically split.  There is no murmur gallop rub or click.  There is no abnormal lift or heave.  The abdomen is soft and nontender.  The bowel sounds are normal.  The liver and spleen are not enlarged.  There are no abdominal masses.  There are no abdominal bruits.  Extremities reveal good pedal pulses.  There is no phlebitis or edema.  There is no cyanosis or clubbing.  Strength is normal and symmetrical in all extremities.  There is no lateralizing weakness.  There are no sensory deficits.  The skin is warm and dry.  There is no rash.     Assessment / Plan: 1. ischemic heart disease status post PCI on 11/15/13 with a drug-eluting stent for in-stent restenosis of circumflex. 2.  hypertensive cardiovascular disease without heart failure 3. Dyslipidemia 4. diabetes mellitus type 2 5. past history of epistaxis on Plavix and aspirin.  If this occurs again this time we will need to consider cutting back on either the aspirin or the Plavix to possibly every other day.  Recheck in 4 months for office visit EKG lipid panel hepatic function panel basal metabolic panel and Z6O

## 2013-11-28 NOTE — Assessment & Plan Note (Signed)
The patient is tolerating his WelChol.

## 2013-11-28 NOTE — Assessment & Plan Note (Signed)
The patient is not having any hypoglycemic episodes 

## 2013-11-28 NOTE — Patient Instructions (Signed)
Your physician recommends that you continue on your current medications as directed. Please refer to the Current Medication list given to you today.  Your physician recommends that you schedule a follow-up appointment in: 4 months with fasting labs (lp/bmet/hfp/a1c) and ekg

## 2013-12-06 ENCOUNTER — Other Ambulatory Visit: Payer: Self-pay

## 2013-12-06 MED ORDER — PANTOPRAZOLE SODIUM 40 MG PO TBEC: 40.0000 mg | DELAYED_RELEASE_TABLET | Freq: Every day | ORAL | Status: DC

## 2013-12-06 MED ORDER — CLOPIDOGREL BISULFATE 75 MG PO TABS: 75.0000 mg | ORAL_TABLET | Freq: Every day | ORAL | Status: DC

## 2014-02-14 ENCOUNTER — Encounter: Payer: Self-pay | Admitting: Cardiology

## 2014-02-14 ENCOUNTER — Other Ambulatory Visit: Payer: Self-pay

## 2014-02-14 MED ORDER — COLESEVELAM HCL 625 MG PO TABS: 1875.0000 mg | ORAL_TABLET | Freq: Two times a day (BID) | ORAL | Status: DC

## 2014-02-14 MED ORDER — PANTOPRAZOLE SODIUM 40 MG PO TBEC: 40.0000 mg | DELAYED_RELEASE_TABLET | Freq: Every day | ORAL | Status: DC

## 2014-02-14 MED ORDER — CLOPIDOGREL BISULFATE 75 MG PO TABS: 75.0000 mg | ORAL_TABLET | Freq: Every day | ORAL | Status: DC

## 2014-04-03 ENCOUNTER — Ambulatory Visit (INDEPENDENT_AMBULATORY_CARE_PROVIDER_SITE_OTHER): Payer: 59 | Admitting: Cardiology

## 2014-04-03 ENCOUNTER — Other Ambulatory Visit (INDEPENDENT_AMBULATORY_CARE_PROVIDER_SITE_OTHER): Payer: 59

## 2014-04-03 ENCOUNTER — Encounter: Payer: Self-pay | Admitting: Cardiology

## 2014-04-03 VITALS — BP 130/88 | HR 68 | Ht 70.0 in | Wt 171.4 lb

## 2014-04-03 DIAGNOSIS — E7800 Pure hypercholesterolemia, unspecified: Secondary | ICD-10-CM

## 2014-04-03 DIAGNOSIS — I259 Chronic ischemic heart disease, unspecified: Secondary | ICD-10-CM

## 2014-04-03 DIAGNOSIS — IMO0001 Reserved for inherently not codable concepts without codable children: Secondary | ICD-10-CM

## 2014-04-03 DIAGNOSIS — E1165 Type 2 diabetes mellitus with hyperglycemia: Secondary | ICD-10-CM

## 2014-04-03 DIAGNOSIS — I209 Angina pectoris, unspecified: Secondary | ICD-10-CM

## 2014-04-03 DIAGNOSIS — I119 Hypertensive heart disease without heart failure: Secondary | ICD-10-CM

## 2014-04-03 LAB — HEPATIC FUNCTION PANEL
ALT: 66 U/L — AB (ref 0–53)
AST: 35 U/L (ref 0–37)
Albumin: 4.1 g/dL (ref 3.5–5.2)
Alkaline Phosphatase: 39 U/L (ref 39–117)
BILIRUBIN DIRECT: 0.1 mg/dL (ref 0.0–0.3)
BILIRUBIN TOTAL: 0.4 mg/dL (ref 0.2–1.2)
Total Protein: 7.4 g/dL (ref 6.0–8.3)

## 2014-04-03 LAB — BASIC METABOLIC PANEL
BUN: 17 mg/dL (ref 6–23)
CO2: 26 mEq/L (ref 19–32)
CREATININE: 1.1 mg/dL (ref 0.4–1.5)
Calcium: 9.4 mg/dL (ref 8.4–10.5)
Chloride: 99 mEq/L (ref 96–112)
GFR: 76.05 mL/min (ref >60.00)
GLUCOSE: 154 mg/dL — AB (ref 70–99)
Potassium: 4.2 mEq/L (ref 3.5–5.1)
Sodium: 135 mEq/L (ref 135–145)

## 2014-04-03 LAB — LIPID PANEL
Cholesterol: 225 mg/dL — ABNORMAL HIGH (ref 0–200)
HDL: 44.6 mg/dL (ref >39.00)
NonHDL: 180.4
Total CHOL/HDL Ratio: 5
Triglycerides: 251 mg/dL — ABNORMAL HIGH (ref 0.0–149.0)
VLDL: 50.2 mg/dL — ABNORMAL HIGH (ref 0.0–40.0)

## 2014-04-03 LAB — HEMOGLOBIN A1C: Hgb A1c MFr Bld: 7.9 % — ABNORMAL HIGH (ref 4.6–6.5)

## 2014-04-03 LAB — LDL CHOLESTEROL, DIRECT: Direct LDL: 160.8 mg/dL

## 2014-04-03 NOTE — Patient Instructions (Addendum)
Your physician wants you to follow-up in: 4 months with fasting labs (lp/bmet/hfp)  You will receive a reminder letter in the mail two months in advance. If you don't receive a letter, please call our office to schedule the follow-up appointment.   Your physician recommends that you continue on your current medications as directed. Please refer to the Current Medication list given to you today.

## 2014-04-03 NOTE — Assessment & Plan Note (Signed)
The patient has not had any hypoglycemic episodes

## 2014-04-03 NOTE — Progress Notes (Signed)
[name] Comment Date of Birth:  05-Jul-1951 [facility] [address] [place], [place]  [address] [phone]        Fax   [phone]   History of Present Illness: This pleasant 63 year old gentleman is seen for a scheduled  followup office visit.  He has a past history of known coronary disease, with a history of an acute lateral myocardial infarction 2004 followed by cardiac catheterization and PCI with prior bare-metal stents to the mid circumflex by [name].  He had done well in the ensuing 10 years until recently when he began to experience crescendo angina pectoris.  He was hospitalized on 11/15/13 for cardiac catheterization. Cardiac catheterization was completed by [name] on 11/15/2013 demonstrating severe in-stent restenosis of the mid circumflex and 99%. Intervention was completed using a Resolute Integrity DES. He was recommended for dual antiplatelet therapy with aspirin and Plavix for one year, and to continue other medications prior to admission.   Of note, the patient had had issues with Plavix in the past causing nosebleeds, the patient has not had any recurrent problems with epistaxis this time. Other medical conditions include diabetes, hypertension, and hypercholesterolemia. Since last visit the patient has not been getting as much regular exercise.  He intends to start going back to the [facility] more often.  The patient sold his horses since we last saw him.  Current Outpatient Prescriptions  Medication Sig Dispense Refill  . aspirin 81 MG tablet Take 81 mg by mouth daily.        . carvedilol (COREG) 12.5 MG tablet Take 1 tablet (12.5 mg total) by mouth 2 (two) times daily with a meal.  180 tablet  3  . clopidogrel (PLAVIX) 75 MG tablet Take 1 tablet (75 mg total) by mouth daily with breakfast.  90 tablet  2  . colesevelam (WELCHOL) 625 MG tablet Take 3 tablets (1,875 mg total) by mouth 2 (two) times daily with a meal.  540 tablet  2  .  metFORMIN (GLUCOPHAGE) 1000 MG tablet Take 1 tablet (1,000 mg total) by mouth 2 (two) times daily with a meal.  180 tablet  3  . nitroGLYCERIN (NITROSTAT) 0.4 MG SL tablet Place 1 tablet (0.4 mg total) under the tongue every 5 (five) minutes as needed for chest pain.  100 tablet  prn  . omega-3 acid ethyl esters (LOVAZA) 1 G capsule Take 2 capsules (2 g total) by mouth 2 (two) times daily.  360 capsule  3  . pantoprazole (PROTONIX) 40 MG tablet Take 1 tablet (40 mg total) by mouth daily.  30 tablet  6  . rosuvastatin (CRESTOR) 5 MG tablet Take 1 tablet (5 mg total) by mouth every other day.  45 tablet  3  . valsartan (DIOVAN) 160 MG tablet Take 160 mg by mouth daily.      . [DISCONTINUED] Omeprazole Magnesium (PRILOSEC OTC PO) Take 1 capsule by mouth daily. Taking  Every other day       No current facility-administered medications for this visit.    Allergies  Allergen Reactions  . Beta Adrenergic Blockers Other (See Comments)    High doses make joints hurt.    Patient Active Problem List   Diagnosis Date Noted  . Coronary artery disease     Priority: High  . Hypertension     Priority: High  . Type II or unspecified type diabetes mellitus without mention of complication, uncontrolled     Priority: Medium  .  Hypercholesterolemia     Priority: Medium  . Unstable angina 11/15/2013  . Angina pectoris 11/13/2013  . Malaise and fatigue 02/18/2013  . Ischemic heart disease     History  Smoking status  . Never Smoker   Smokeless tobacco  . Never Used    History  Alcohol Use No    Family History  Problem Relation Age of Onset  . Coronary artery disease Father     with multiple angioplasties  . Hypertension      runs in the family    Review of Systems: Constitutional: no fever chills diaphoresis or fatigue or change in weight.  Head and neck: no hearing loss, no epistaxis, no photophobia or visual disturbance. Respiratory: No cough, shortness of breath or  wheezing. Cardiovascular: No chest pain peripheral edema, palpitations. Gastrointestinal: No abdominal distention, no abdominal pain, no change in bowel habits hematochezia or melena. Genitourinary: No dysuria, no frequency, no urgency, no nocturia. Musculoskeletal:No arthralgias, no back pain, no gait disturbance or myalgias. Neurological: No dizziness, no headaches, no numbness, no seizures, no syncope, no weakness, no tremors. Hematologic: No lymphadenopathy, no easy bruising. Psychiatric: No confusion, no hallucinations, no sleep disturbance.    Physical Exam: Filed Vitals:   04/03/14 0807  BP: 130/88  Pulse: 68   the general appearance reveals a well-developed well-nourished gentleman in no distress.The head and neck exam reveals pupils equal and reactive.  Extraocular movements are full.  There is no scleral icterus.  The mouth and pharynx are normal.  The neck is supple.  The carotids reveal no bruits.  The jugular venous pressure is normal.  The  thyroid is not enlarged.  There is no lymphadenopathy.  The chest is clear to percussion and auscultation.  There are no rales or rhonchi.  Expansion of the chest is symmetrical.  The precordium is quiet.  The first heart sound is normal.  The second heart sound is physiologically split.  There is no murmur gallop rub or click.  There is no abnormal lift or heave.  The abdomen is soft and nontender.  The bowel sounds are normal.  The liver and spleen are not enlarged.  There are no abdominal masses.  There are no abdominal bruits.  Extremities reveal good pedal pulses.  There is no phlebitis or edema.  There is no cyanosis or clubbing.  Strength is normal and symmetrical in all extremities.  There is no lateralizing weakness.  There are no sensory deficits.  The skin is warm and dry.  There is no rash.  EKG today shows normal sinus rhythm and is within limits  Assessment / Plan: 1. ischemic heart disease status post PCI on 11/15/13 with a  drug-eluting stent for in-stent restenosis of circumflex. 2. hypertensive cardiovascular disease without heart failure 3. Dyslipidemia 4. diabetes mellitus type 2 5. past history of epistaxis on Plavix and aspirin.  No recurrent epistaxis has occurred.  Recheck in 4 months for office visit EKG lipid panel hepatic function panel basal metabolic panel and S2G

## 2014-04-03 NOTE — Assessment & Plan Note (Signed)
The patient has had no recurrent angina pectoris since his angioplasty and stents in April 2015

## 2014-04-03 NOTE — Assessment & Plan Note (Signed)
The patient is taking alternate day Crestor and so far he is not having myalgias from it.  Laboratory work today is pending.

## 2014-04-03 NOTE — Progress Notes (Signed)
Quick Note:  Please report to patient. The recent labs are stable. Continue same medication and careful diet. The hemoglobin A1c is lightly improved at 7.9. The triglycerides are still high. Continue to work on careful diet and increase regular exercise. Total cholesterol is improved at 225. Continue current medicines ______

## 2014-07-03 ENCOUNTER — Encounter (HOSPITAL_COMMUNITY): Payer: Self-pay | Admitting: Cardiovascular Disease

## 2014-07-23 ENCOUNTER — Other Ambulatory Visit: Payer: Self-pay | Admitting: Cardiology

## 2014-08-20 ENCOUNTER — Other Ambulatory Visit (INDEPENDENT_AMBULATORY_CARE_PROVIDER_SITE_OTHER): Payer: 59 | Admitting: *Deleted

## 2014-08-20 ENCOUNTER — Encounter: Payer: Self-pay | Admitting: Cardiology

## 2014-08-20 ENCOUNTER — Ambulatory Visit (INDEPENDENT_AMBULATORY_CARE_PROVIDER_SITE_OTHER): Payer: 59 | Admitting: Cardiology

## 2014-08-20 VITALS — BP 122/84 | HR 70 | Ht 69.5 in | Wt 169.1 lb

## 2014-08-20 DIAGNOSIS — E7800 Pure hypercholesterolemia, unspecified: Secondary | ICD-10-CM

## 2014-08-20 DIAGNOSIS — E1165 Type 2 diabetes mellitus with hyperglycemia: Secondary | ICD-10-CM

## 2014-08-20 DIAGNOSIS — IMO0002 Reserved for concepts with insufficient information to code with codable children: Secondary | ICD-10-CM

## 2014-08-20 DIAGNOSIS — E118 Type 2 diabetes mellitus with unspecified complications: Secondary | ICD-10-CM

## 2014-08-20 DIAGNOSIS — I119 Hypertensive heart disease without heart failure: Secondary | ICD-10-CM

## 2014-08-20 DIAGNOSIS — I259 Chronic ischemic heart disease, unspecified: Secondary | ICD-10-CM

## 2014-08-20 LAB — LIPID PANEL
CHOLESTEROL: 214 mg/dL — AB (ref 0–200)
HDL: 43.6 mg/dL (ref >39.00)
NonHDL: 170.4
Total CHOL/HDL Ratio: 5
Triglycerides: 236 mg/dL — ABNORMAL HIGH (ref 0.0–149.0)
VLDL: 47.2 mg/dL — ABNORMAL HIGH (ref 0.0–40.0)

## 2014-08-20 LAB — HEPATIC FUNCTION PANEL
ALT: 54 U/L — AB (ref 0–53)
AST: 35 U/L (ref 0–37)
Albumin: 4.3 g/dL (ref 3.5–5.2)
Alkaline Phosphatase: 39 U/L (ref 39–117)
BILIRUBIN DIRECT: 0.1 mg/dL (ref 0.0–0.3)
TOTAL PROTEIN: 7.3 g/dL (ref 6.0–8.3)
Total Bilirubin: 0.5 mg/dL (ref 0.2–1.2)

## 2014-08-20 LAB — BASIC METABOLIC PANEL
BUN: 17 mg/dL (ref 6–23)
CALCIUM: 9.8 mg/dL (ref 8.4–10.5)
CHLORIDE: 101 meq/L (ref 96–112)
CO2: 27 meq/L (ref 19–32)
CREATININE: 1.04 mg/dL (ref 0.40–1.50)
GFR: 76.8 mL/min (ref >60.00)
Glucose, Bld: 174 mg/dL — ABNORMAL HIGH (ref 70–99)
Potassium: 4.6 mEq/L (ref 3.5–5.1)
Sodium: 135 mEq/L (ref 135–145)

## 2014-08-20 LAB — LDL CHOLESTEROL, DIRECT: Direct LDL: 150 mg/dL

## 2014-08-20 LAB — HEMOGLOBIN A1C: HEMOGLOBIN A1C: 8 % — AB (ref 4.6–6.5)

## 2014-08-20 MED ORDER — PANTOPRAZOLE SODIUM 40 MG PO TBEC: 40.0000 mg | DELAYED_RELEASE_TABLET | Freq: Every day | ORAL | Status: DC

## 2014-08-20 NOTE — Progress Notes (Signed)
Cardiology Office Note   Date:  08/20/2014   ID:  Levander, Katzenstein 02/03/1951, MRN [id number]  PCP:  [name], MD  Cardiologist:   [name], MD   No chief complaint on file.     History of Present Illness: Eric Ball is a 64 y.o. male who presents for routine follow-up.  This pleasant 64 year old gentleman is seen for a scheduled followup office visit. He has a past history of known coronary disease, with a history of an acute lateral myocardial infarction 2004 followed by cardiac catheterization and PCI with prior bare-metal stents to the mid circumflex by [name]. He had done well in the ensuing 10 years until recently when he began to experience crescendo angina pectoris. He was hospitalized on 11/15/13 for cardiac catheterization. Cardiac catheterization was completed by [name] on 11/15/2013 demonstrating severe in-stent restenosis of the mid circumflex and 99%. Intervention was completed using a Resolute Integrity DES. He was recommended for dual antiplatelet therapy with aspirin and Plavix for one year, and to continue other medications prior to admission.   Of note, the patient had had issues with Plavix in the past causing nosebleeds, the patient has not had any recurrent problems with epistaxis this time. Other medical conditions include diabetes, hypertension, and hypercholesterolemia. Since last visit the patient has not been getting as much regular exercise.  No chest pain or shortness of breath.  No epistaxis.  Past Medical History  Diagnosis Date  . Coronary artery disease 2015    PTCA with DES to Cx; PCI  BMS 2004  . Hypercholesterolemia     with intolerant ot many of the cholesterol medicaitons  . Ischemic heart disease   . Hypertension     essentai hypertension  . Complication of anesthesia     "I'm slow to come out"  . History of myocardial infarction     with a stent placed at that time  . Myocardial infarction 2004   with a stent placed at that time  . Sleep apnea     "suppose to wear mask; I don't wear it enough" (11/15/2013)  . Type II diabetes mellitus   . GERD (gastroesophageal reflux disease)   . H/O hiatal hernia   . Nephrosis  1960's    Past Surgical History  Procedure Laterality Date  . Nasal septum surgery  02/20/2001    Septal reconstruction and turbinate reduction  . Back surgery    . Colonoscopy  10/07/2011    Procedure: COLONOSCOPY;  Surgeon: [name], MD;  Location: [facility];  Service: Endoscopy;  Laterality: N/A;  8:15 AM  . Lumbar disc surgery  1991  . Coronary angioplasty with stent placement  09/25/2002    EF-approximately 45% / stent placement to the mid arteriovenous circumflex with reduction of 99% narrowing to 0% with placement of a 3.0 x 16 mm Express II stent with improvement of TIMI grade 2 to TIMI grade 3 flow.  . Coronary angioplasty with stent placement  11/15/2013    "1"  . Left heart catheterization with coronary angiogram N/A 11/15/2013    Procedure: LEFT HEART CATHETERIZATION WITH CORONARY ANGIOGRAM;  Surgeon: Burnell Blanks, MD;  Location: [facility] CATH LAB;  Service: Cardiovascular;  Laterality: N/A;     Current Outpatient Prescriptions  Medication Sig Dispense Refill  . aspirin 81 MG tablet Take 81 mg by mouth daily.      . carvedilol (COREG) 12.5 MG tablet Take 1 tablet (12.5 mg total) by  mouth 2 (two) times daily with a meal. 180 tablet 3  . clopidogrel (PLAVIX) 75 MG tablet Take 1 tablet (75 mg total) by mouth daily with breakfast. 90 tablet 2  . colesevelam (WELCHOL) 625 MG tablet Take 3 tablets (1,875 mg total) by mouth 2 (two) times daily with a meal. 540 tablet 2  . CRESTOR 5 MG tablet TAKE 1 TABLET EVERY OTHER  DAY 45 tablet 3  . metFORMIN (GLUCOPHAGE) 1000 MG tablet Take 1 tablet (1,000 mg total) by mouth 2 (two) times daily with a meal. 180 tablet 3  . nitroGLYCERIN (NITROSTAT) 0.4 MG SL tablet Place 1 tablet (0.4 mg total) under the tongue  every 5 (five) minutes as needed for chest pain. 100 tablet prn  . omega-3 acid ethyl esters (LOVAZA) 1 G capsule Take 2 capsules (2 g total) by mouth 2 (two) times daily. 360 capsule 3  . pantoprazole (PROTONIX) 40 MG tablet Take 1 tablet (40 mg total) by mouth daily. 30 tablet 6  . valsartan (DIOVAN) 160 MG tablet Take 160 mg by mouth daily.    . [DISCONTINUED] Omeprazole Magnesium (PRILOSEC OTC PO) Take 1 capsule by mouth daily. Taking  Every other day     No current facility-administered medications for this visit.    Allergies:   Beta adrenergic blockers    Social History:  The patient  reports that he has never smoked. He has never used smokeless tobacco. He reports that he does not drink alcohol or use illicit drugs.   Family History:  The patient's family history includes Coronary artery disease in his father; Hypertension in an other family member.    ROS:  Please see the history of present illness.   Otherwise, review of systems are positive for none.   All other systems are reviewed and negative.    PHYSICAL EXAM: VS:  BP 122/84 mmHg  Pulse 70  Ht 5' 9.5" (1.765 m)  Wt 169 lb 1.9 oz (76.712 kg)  BMI 24.62 kg/m2 , BMI Body mass index is 24.62 kg/(m^2). GEN: Well nourished, well developed, in no acute distress HEENT: normal Neck: no JVD, carotid bruits, or masses Cardiac: RRR; no murmurs, rubs, or gallops,no edema  Respiratory:  clear to auscultation bilaterally, normal work of breathing GI: soft, nontender, nondistended, + BS MS: no deformity or atrophy Skin: warm and dry, no rash Neuro:  Strength and sensation are intact Psych: euthymic mood, full affect   EKG:  EKG is ordered today. The ekg ordered today demonstrates normal sinus rhythm.  No ischemic changes.   Recent Labs: 11/16/2013: Hemoglobin 14.2; Platelets 211 04/03/2014: ALT 66*; BUN 17; Creatinine 1.1; Potassium 4.2; Sodium 135    Lipid Panel    Component Value Date/Time   CHOL 225* 04/03/2014 0755     TRIG 251.0* 04/03/2014 0755   HDL 44.60 04/03/2014 0755   CHOLHDL 5 04/03/2014 0755   VLDL 50.2* 04/03/2014 0755   LDLCALC 147* 10/16/2013 0850   LDLDIRECT 160.8 04/03/2014 0755      Wt Readings from Last 3 Encounters:  08/20/14 169 lb 1.9 oz (76.712 kg)  04/03/14 171 lb 6.4 oz (77.747 kg)  11/28/13 172 lb (78.019 kg)      Other studies Reviewed: Additional studies/ records that were reviewed today include: . Review of the above records demonstrates:    ASSESSMENT AND PLAN:  1. ischemic heart disease status post PCI on 11/15/13 with a drug-eluting stent for in-stent restenosis of circumflex.  No recurrent angina. 2. hypertensive cardiovascular  disease without heart failure 3. Dyslipidemia.  Tolerating statin therapy.  No myalgias. 4. diabetes mellitus type 2.  No hypoglycemic reaction. 5. past history of epistaxis on Plavix and aspirin. No recurrent epistaxis has occurred.   Current medicines are reviewed at length with the patient today.  The patient does not have concerns regarding medicines.  The following changes have been made:  no change  Labs/ tests ordered today include: Lipid panel, hepatic function panel, and basal metabolic panel, and V6H   Orders Placed This Encounter  Procedures  . Lipid panel  . Hepatic function panel  . Basic metabolic panel  . CBC with Differential/Platelet  . Hemoglobin A1c  . EKG 12-Lead     Disposition:   FU with  in 4 months for office visit lipid panel hepatic function panel basal metabolic panel and M0N and CBC   Signed, [name], MD  08/20/2014 8:25 AM    [facility] [place], [place], [place]  [address] Phone: [phone]; Fax: [phone]

## 2014-08-20 NOTE — Patient Instructions (Signed)
Will obtain labs today and call you with the results (lp/bmet/hfp/a1c)  Your physician recommends that you continue on your current medications as directed. Please refer to the Current Medication list given to you today.  Your physician recommends that you schedule a follow-up appointment in: 4 months with fasting labs (lp/bmet/hfp/a1c/cbc)

## 2014-08-21 NOTE — Progress Notes (Signed)
Quick Note:  Please report to patient. The recent labs are stable. Continue same medication and careful diet. Blood sugar is higher. A1c is slightly higher at 8.0. Work harder on weight loss diet and exercise ______

## 2014-09-24 ENCOUNTER — Encounter: Payer: Self-pay | Admitting: Internal Medicine

## 2014-10-09 ENCOUNTER — Other Ambulatory Visit: Payer: Self-pay

## 2014-10-09 MED ORDER — OMEGA-3-ACID ETHYL ESTERS 1 G PO CAPS: 2.0000 g | ORAL_CAPSULE | Freq: Two times a day (BID) | ORAL | Status: DC

## 2014-11-14 ENCOUNTER — Other Ambulatory Visit: Payer: Self-pay

## 2014-11-14 ENCOUNTER — Encounter: Payer: Self-pay | Admitting: Gastroenterology

## 2014-11-14 ENCOUNTER — Ambulatory Visit (INDEPENDENT_AMBULATORY_CARE_PROVIDER_SITE_OTHER): Payer: 59 | Admitting: Gastroenterology

## 2014-11-14 VITALS — BP 133/86 | HR 75 | Temp 97.2°F | Ht 70.0 in | Wt 170.0 lb

## 2014-11-14 DIAGNOSIS — Z86010 Personal history of colonic polyps: Secondary | ICD-10-CM | POA: Diagnosis not present

## 2014-11-14 MED ORDER — PEG-KCL-NACL-NASULF-NA ASC-C 100 G PO SOLR: 1.0000 | Freq: Once | ORAL | Status: DC

## 2014-11-14 MED ORDER — PEG-KCL-NACL-NASULF-NA ASC-C 100 G PO SOLR: 1.0000 | Freq: Once | ORAL | Status: AC

## 2014-11-14 NOTE — Patient Instructions (Signed)
We have scheduled you for a colonoscopy with [name] in the near future.  Further recommendations to follow!   

## 2014-11-14 NOTE — Progress Notes (Signed)
    Primary Care Physician:  FUSCO,LAWRENCE J., MD Primary Gastroenterologist:  [name]   Chief Complaint  Patient presents with  . OTHER    set up colonoscopy/     HPI:   Eric Ball is a 64 y.o. male presenting today with a history of multiple colonic polyps in March 2013, adenomas noted, now due for early interval surveillance.   No abdominal pain. No N/V. No constipation/diarrhea. No rectal bleeding. Protonix once daily. No dysphagia.   Past Medical History  Diagnosis Date  . Coronary artery disease 2015    PTCA with DES to Cx; PCI  BMS 2004  . Hypercholesterolemia     with intolerant ot many of the cholesterol medicaitons  . Ischemic heart disease   . Hypertension     essentai hypertension  . Complication of anesthesia     "I'm slow to come out"  . History of myocardial infarction     with a stent placed at that time  . Myocardial infarction 2004    with a stent placed at that time  . Sleep apnea     "suppose to wear mask; I don't wear it enough" (11/15/2013)  . Type II diabetes mellitus   . GERD (gastroesophageal reflux disease)   . H/O hiatal hernia   . Nephrosis  1960's    Past Surgical History  Procedure Laterality Date  . Nasal septum surgery  02/20/2001    Septal reconstruction and turbinate reduction  . Back surgery    . Colonoscopy  10/07/2011    [name]:multiple colonic polyps removed/colonic diverticulosis, tubular adenoma  . Lumbar disc surgery  1991  . Coronary angioplasty with stent placement  09/25/2002    EF-approximately 45% / stent placement to the mid arteriovenous circumflex with reduction of 99% narrowing to 0% with placement of a 3.0 x 16 mm Express II stent with improvement of TIMI grade 2 to TIMI grade 3 flow.  . Coronary angioplasty with stent placement  11/15/2013    "1"  . Left heart catheterization with coronary angiogram N/A 11/15/2013    Procedure: LEFT HEART CATHETERIZATION WITH CORONARY ANGIOGRAM;  Surgeon: Christopher D  McAlhany, MD;  Location: [facility] CATH LAB;  Service: Cardiovascular;  Laterality: N/A;    Current Outpatient Prescriptions  Medication Sig Dispense Refill  . aspirin 81 MG tablet Take 81 mg by mouth daily.      . carvedilol (COREG) 12.5 MG tablet Take 1 tablet (12.5 mg total) by mouth 2 (two) times daily with a meal. 180 tablet 3  . clopidogrel (PLAVIX) 75 MG tablet Take 1 tablet (75 mg total) by mouth daily with breakfast. 90 tablet 2  . colesevelam (WELCHOL) 625 MG tablet Take 3 tablets (1,875 mg total) by mouth 2 (two) times daily with a meal. 540 tablet 2  . CRESTOR 5 MG tablet TAKE 1 TABLET EVERY OTHER  DAY 45 tablet 3  . metFORMIN (GLUCOPHAGE) 1000 MG tablet Take 1 tablet (1,000 mg total) by mouth 2 (two) times daily with a meal. 180 tablet 3  . omega-3 acid ethyl esters (LOVAZA) 1 G capsule Take 2 capsules (2 g total) by mouth 2 (two) times daily. 360 capsule 3  . pantoprazole (PROTONIX) 40 MG tablet Take 1 tablet (40 mg total) by mouth daily. 90 tablet 3  . valsartan (DIOVAN) 160 MG tablet Take 160 mg by mouth daily.    . peg 3350 powder (MOVIPREP) 100 G SOLR Take 1 kit (200 g total) by   mouth once. 1 kit 0  . [DISCONTINUED] Omeprazole Magnesium (PRILOSEC OTC PO) Take 1 capsule by mouth daily. Taking  Every other day     No current facility-administered medications for this visit.    Allergies as of 11/14/2014 - Review Complete 11/14/2014  Allergen Reaction Noted  . Beta adrenergic blockers Other (See Comments) 11/19/2010    Family History  Problem Relation Age of Onset  . Coronary artery disease Father     with multiple angioplasties  . Hypertension      runs in the family  . Colon cancer Neg Hx     History   Social History  . Marital Status: Married    Spouse Name: N/A  . Number of Children: N/A  . Years of Education: N/A   Occupational History  . Not on file.   Social History Main Topics  . Smoking status: Never Smoker   . Smokeless tobacco: Never Used      Comment: Never smoker  . Alcohol Use: No  . Drug Use: No  . Sexual Activity: Yes   Other Topics Concern  . Not on file   Social History Narrative    Review of Systems: Gen: Denies any fever, chills, fatigue, weight loss, lack of appetite.  CV: Denies chest pain, heart palpitations, peripheral edema, syncope.  Resp: +DOE GI: Denies dysphagia or odynophagia. Denies jaundice, hematemesis, fecal incontinence. GU : Denies urinary burning, urinary frequency, urinary hesitancy MS: +back pain Derm: Denies rash, itching, dry skin Psych: Denies depression, anxiety, memory loss, and confusion Heme: Denies bruising, bleeding, and enlarged lymph nodes.  Physical Exam: BP 133/86 mmHg  Pulse 75  Temp(Src) 97.2 F (36.2 C)  Ht 5' 10" (1.778 m)  Wt 170 lb (77.111 kg)  BMI 24.39 kg/m2 General:   Alert and oriented. Pleasant and cooperative. Well-nourished and well-developed.  Head:  Normocephalic and atraumatic. Eyes:  Without icterus, sclera clear and conjunctiva pink.  Ears:  Normal auditory acuity. Nose:  No deformity, discharge,  or lesions. Mouth:  No deformity or lesions, oral mucosa pink.  Lungs:  Clear to auscultation bilaterally. No wheezes, rales, or rhonchi. No distress.  Heart:  S1, S2 present without murmurs appreciated.  Abdomen:  +BS, soft, non-tender and non-distended. No HSM noted. No guarding or rebound. No masses appreciated.  Rectal:  Deferred  Msk:  Symmetrical without gross deformities. Normal posture. Extremities:  Without clubbing or edema. Neurologic:  Alert and  oriented x4;  grossly normal neurologically. Skin:  Intact without significant lesions or rashes. Psych:  Alert and cooperative. Normal mood and affect.    

## 2014-11-19 ENCOUNTER — Encounter: Payer: Self-pay | Admitting: Gastroenterology

## 2014-11-19 NOTE — Assessment & Plan Note (Signed)
64 year old male with history of multiple colonic polyps in 2013, due for early interval surveillance colonoscopy now. No concerning lower or upper GI symptoms.   Proceed with TCS with Dr. Gala Romney in near future: the risks, benefits, and alternatives have been discussed with the patient in detail. The patient states understanding and desires to proceed.

## 2014-11-19 NOTE — Progress Notes (Signed)
cc'ed to pcp °

## 2014-12-08 ENCOUNTER — Ambulatory Visit (HOSPITAL_COMMUNITY)
Admission: RE | Admit: 2014-12-08 | Discharge: 2014-12-08 | Disposition: A | Discharge status: Home / Self Care | Payer: 59 | Source: Ambulatory Visit | Attending: Internal Medicine | Admitting: Internal Medicine

## 2014-12-08 ENCOUNTER — Encounter (HOSPITAL_COMMUNITY)
Admission: RE | Disposition: A | Discharge status: Home / Self Care | Payer: Self-pay | Source: Ambulatory Visit | Attending: Internal Medicine

## 2014-12-08 DIAGNOSIS — I2510 Atherosclerotic heart disease of native coronary artery without angina pectoris: Secondary | ICD-10-CM | POA: Insufficient documentation

## 2014-12-08 DIAGNOSIS — Z7902 Long term (current) use of antithrombotics/antiplatelets: Secondary | ICD-10-CM | POA: Insufficient documentation

## 2014-12-08 DIAGNOSIS — E780 Pure hypercholesterolemia: Secondary | ICD-10-CM | POA: Insufficient documentation

## 2014-12-08 DIAGNOSIS — Z955 Presence of coronary angioplasty implant and graft: Secondary | ICD-10-CM | POA: Diagnosis not present

## 2014-12-08 DIAGNOSIS — E119 Type 2 diabetes mellitus without complications: Secondary | ICD-10-CM | POA: Diagnosis not present

## 2014-12-08 DIAGNOSIS — Z79899 Other long term (current) drug therapy: Secondary | ICD-10-CM | POA: Insufficient documentation

## 2014-12-08 DIAGNOSIS — I10 Essential (primary) hypertension: Secondary | ICD-10-CM | POA: Diagnosis not present

## 2014-12-08 DIAGNOSIS — I252 Old myocardial infarction: Secondary | ICD-10-CM | POA: Diagnosis not present

## 2014-12-08 DIAGNOSIS — Z86010 Personal history of colonic polyps: Secondary | ICD-10-CM | POA: Diagnosis not present

## 2014-12-08 DIAGNOSIS — Z7982 Long term (current) use of aspirin: Secondary | ICD-10-CM | POA: Insufficient documentation

## 2014-12-08 DIAGNOSIS — K5730 Diverticulosis of large intestine without perforation or abscess without bleeding: Secondary | ICD-10-CM | POA: Insufficient documentation

## 2014-12-08 DIAGNOSIS — D120 Benign neoplasm of cecum: Secondary | ICD-10-CM | POA: Diagnosis not present

## 2014-12-08 DIAGNOSIS — Z09 Encounter for follow-up examination after completed treatment for conditions other than malignant neoplasm: Secondary | ICD-10-CM | POA: Diagnosis present

## 2014-12-08 DIAGNOSIS — K219 Gastro-esophageal reflux disease without esophagitis: Secondary | ICD-10-CM | POA: Diagnosis not present

## 2014-12-08 DIAGNOSIS — G4730 Sleep apnea, unspecified: Secondary | ICD-10-CM | POA: Diagnosis not present

## 2014-12-08 HISTORY — PX: COLONOSCOPY: SHX5424

## 2014-12-08 LAB — GLUCOSE, CAPILLARY: GLUCOSE-CAPILLARY: 143 mg/dL — AB (ref 65–99)

## 2014-12-08 SURGERY — COLONOSCOPY
Anesthesia: Moderate Sedation

## 2014-12-08 MED ORDER — SIMETHICONE 40 MG/0.6ML PO SUSP
ORAL | Status: DC | PRN
  Administered 2014-12-08: 10:00:00

## 2014-12-08 MED ORDER — MEPERIDINE HCL 100 MG/ML IJ SOLN
INTRAMUSCULAR | Status: DC | PRN
  Administered 2014-12-08: 50 mg via INTRAVENOUS

## 2014-12-08 MED ORDER — MEPERIDINE HCL 100 MG/ML IJ SOLN
INTRAMUSCULAR | Status: AC
  Filled 2014-12-08: qty 2

## 2014-12-08 MED ORDER — ONDANSETRON HCL 4 MG/2ML IJ SOLN
INTRAMUSCULAR | Status: AC
  Filled 2014-12-08: qty 2

## 2014-12-08 MED ORDER — MIDAZOLAM HCL 5 MG/5ML IJ SOLN
INTRAMUSCULAR | Status: DC | PRN
  Administered 2014-12-08: 1 mg via INTRAVENOUS
  Administered 2014-12-08: 2 mg via INTRAVENOUS
  Administered 2014-12-08: 1 mg via INTRAVENOUS

## 2014-12-08 MED ORDER — SODIUM CHLORIDE 0.9 % IV SOLN
INTRAVENOUS | Status: DC
  Administered 2014-12-08: 09:00:00 via INTRAVENOUS

## 2014-12-08 MED ORDER — ONDANSETRON HCL 4 MG/2ML IJ SOLN
INTRAMUSCULAR | Status: DC | PRN
  Administered 2014-12-08: 4 mg via INTRAVENOUS

## 2014-12-08 MED ORDER — MIDAZOLAM HCL 5 MG/5ML IJ SOLN
INTRAMUSCULAR | Status: AC
  Filled 2014-12-08: qty 10

## 2014-12-08 NOTE — Op Note (Signed)
[facility] [address] [place], [address]   COLONOSCOPY PROCEDURE REPORT  PATIENT: Eric Ball, Eric Ball  MR#: [id number] BIRTHDATE: [birth year] , 64  yrs. old GENDER: male ENDOSCOPIST: R.  Garfield Cornea, MD FACP [facility] REFERRED AG:TXMIW Gerarda Fraction, M.D. PROCEDURE DATE:  Dec 19, 2014 PROCEDURE:   Colonoscopy with biopsy INDICATIONS:Surveillance examination; history of colonic adenoma. MEDICATIONS: Versed 4 mg IV and Demerol 50 mg IV.  Zofran 4 mg IV. ASA CLASS:       Class III  CONSENT: The risks, benefits, alternatives and imponderables including but not limited to bleeding, perforation as well as the possibility of a missed lesion have been reviewed.  The potential for biopsy, lesion removal, etc. have also been discussed. Questions have been answered.  All parties agreeable.  Please see the history and physical in the medical record for more information.  DESCRIPTION OF PROCEDURE:   After the risks benefits and alternatives of the procedure were thoroughly explained, informed consent was obtained.  The digital rectal exam revealed no abnormalities of the rectum.   The [id number] ([id number])  endoscope was introduced through the anus and advanced to the cecum, which was identified by both the appendix and ileocecal valve. No adverse events experienced.   The quality of the prep was adequate  The instrument was then slowly withdrawn as the colon was fully examined.      COLON FINDINGS: Normal-appearing rectal mucosa.  Few scattered pancolonic diverticula; (1) diminutive polyp in the base the cecum; otherwise, remainder of the colonic mucosa appeared normal.  The above mentioned polyp was cold biopsied/removed.  There was minimal bleeding with this maneuver.  Retroflexion was performed. .   Withdrawal time=8 minutes 0 seconds.  The scope was withdrawn and the procedure completed. COMPLICATIONS: There were no immediate complications.  ENDOSCOPIC IMPRESSION: Colonic  diverticulosis. Single colonic polyp?"removed as described above.  RECOMMENDATIONS: Follow-up on pathology.  eSigned:  R. Garfield Cornea, MD Rosalita Chessman [facility] 2014/12/19 10:34 AM   cc:  CPT CODES: ICD CODES:  The ICD and CPT codes recommended by this software are interpretations from the data that the clinical staff has captured with the software.  The verification of the translation of this report to the ICD and CPT codes and modifiers is the sole responsibility of the health care institution and practicing physician where this report was generated.  [employer]. will not be held responsible for the validity of the ICD and CPT codes included on this report.  AMA assumes no liability for data contained or not contained herein. CPT is a Designer, television/film set of the [employer].  PATIENT NAME:  Arvid, Marengo MR#: [id number]

## 2014-12-08 NOTE — Interval H&P Note (Signed)
History and Physical Interval Note:  12/08/2014 10:06 AM  [name] Comment  has presented today for surgery, with the diagnosis of hx of colon polyps  The various methods of treatment have been discussed with the patient and family. After consideration of risks, benefits and other options for treatment, the patient has consented to  Procedure(s) with comments: COLONOSCOPY (N/A) - 930 as a surgical intervention .  The patient's history has been reviewed, patient examined, no change in status, stable for surgery.  I have reviewed the patient's chart and labs.  Questions were answered to the patient's satisfaction.     Esty Ahuja  No change. Patient here a surveillance colonoscopy. Patient understands the limitations of the procedure should large lesions need to be resected given the risk of bleeding on dual antiplatelet therapy. On aspirin and Plavix.The risks, benefits, limitations, alternatives and imponderables have been reviewed with the patient. Questions have been answered. All parties are agreeable.

## 2014-12-08 NOTE — H&P (View-Only) (Signed)
Primary Care Physician:  [name], MD Primary Gastroenterologist:  Dr. Gala Romney   Chief Complaint  Patient presents with  . OTHER    set up colonoscopy/     HPI:   Eric Ball is a 64 y.o. male presenting today with a history of multiple colonic polyps in March 2013, adenomas noted, now due for early interval surveillance.   No abdominal pain. No N/V. No constipation/diarrhea. No rectal bleeding. Protonix once daily. No dysphagia.   Past Medical History  Diagnosis Date  . Coronary artery disease 2015    PTCA with DES to Cx; PCI  BMS 2004  . Hypercholesterolemia     with intolerant ot many of the cholesterol medicaitons  . Ischemic heart disease   . Hypertension     essentai hypertension  . Complication of anesthesia     "I'm slow to come out"  . History of myocardial infarction     with a stent placed at that time  . Myocardial infarction 2004    with a stent placed at that time  . Sleep apnea     "suppose to wear mask; I don't wear it enough" (11/15/2013)  . Type II diabetes mellitus   . GERD (gastroesophageal reflux disease)   . H/O hiatal hernia   . Nephrosis  1960's    Past Surgical History  Procedure Laterality Date  . Nasal septum surgery  02/20/2001    Septal reconstruction and turbinate reduction  . Back surgery    . Colonoscopy  10/07/2011    [name]:multiple colonic polyps removed/colonic diverticulosis, tubular adenoma  . Lumbar disc surgery  1991  . Coronary angioplasty with stent placement  09/25/2002    EF-approximately 45% / stent placement to the mid arteriovenous circumflex with reduction of 99% narrowing to 0% with placement of a 3.0 x 16 mm Express II stent with improvement of TIMI grade 2 to TIMI grade 3 flow.  . Coronary angioplasty with stent placement  11/15/2013    "1"  . Left heart catheterization with coronary angiogram N/A 11/15/2013    Procedure: LEFT HEART CATHETERIZATION WITH CORONARY ANGIOGRAM;  Surgeon: Burnell Blanks, MD;  Location: [facility] CATH LAB;  Service: Cardiovascular;  Laterality: N/A;    Current Outpatient Prescriptions  Medication Sig Dispense Refill  . aspirin 81 MG tablet Take 81 mg by mouth daily.      . carvedilol (COREG) 12.5 MG tablet Take 1 tablet (12.5 mg total) by mouth 2 (two) times daily with a meal. 180 tablet 3  . clopidogrel (PLAVIX) 75 MG tablet Take 1 tablet (75 mg total) by mouth daily with breakfast. 90 tablet 2  . colesevelam (WELCHOL) 625 MG tablet Take 3 tablets (1,875 mg total) by mouth 2 (two) times daily with a meal. 540 tablet 2  . CRESTOR 5 MG tablet TAKE 1 TABLET EVERY OTHER  DAY 45 tablet 3  . metFORMIN (GLUCOPHAGE) 1000 MG tablet Take 1 tablet (1,000 mg total) by mouth 2 (two) times daily with a meal. 180 tablet 3  . omega-3 acid ethyl esters (LOVAZA) 1 G capsule Take 2 capsules (2 g total) by mouth 2 (two) times daily. 360 capsule 3  . pantoprazole (PROTONIX) 40 MG tablet Take 1 tablet (40 mg total) by mouth daily. 90 tablet 3  . valsartan (DIOVAN) 160 MG tablet Take 160 mg by mouth daily.    . peg 3350 powder (MOVIPREP) 100 G SOLR Take 1 kit (200 g total) by  mouth once. 1 kit 0  . [DISCONTINUED] Omeprazole Magnesium (PRILOSEC OTC PO) Take 1 capsule by mouth daily. Taking  Every other day     No current facility-administered medications for this visit.    Allergies as of 11/14/2014 - Review Complete 11/14/2014  Allergen Reaction Noted  . Beta adrenergic blockers Other (See Comments) 11/19/2010    Family History  Problem Relation Age of Onset  . Coronary artery disease Father     with multiple angioplasties  . Hypertension      runs in the family  . Colon cancer Neg Hx     History   Social History  . Marital Status: Married    Spouse Name: N/A  . Number of Children: N/A  . Years of Education: N/A   Occupational History  . Not on file.   Social History Main Topics  . Smoking status: Never Smoker   . Smokeless tobacco: Never Used      Comment: Never smoker  . Alcohol Use: No  . Drug Use: No  . Sexual Activity: Yes   Other Topics Concern  . Not on file   Social History Narrative    Review of Systems: Gen: Denies any fever, chills, fatigue, weight loss, lack of appetite.  CV: Denies chest pain, heart palpitations, peripheral edema, syncope.  Resp: +DOE GI: Denies dysphagia or odynophagia. Denies jaundice, hematemesis, fecal incontinence. GU : Denies urinary burning, urinary frequency, urinary hesitancy MS: +back pain Derm: Denies rash, itching, dry skin Psych: Denies depression, anxiety, memory loss, and confusion Heme: Denies bruising, bleeding, and enlarged lymph nodes.  Physical Exam: BP 133/86 mmHg  Pulse 75  Temp(Src) 97.2 F (36.2 C)  Ht _0  (1.778 m)  Wt 170 lb (77.111 kg)  BMI 24.39 kg/m2 General:   Alert and oriented. Pleasant and cooperative. Well-nourished and well-developed.  Head:  Normocephalic and atraumatic. Eyes:  Without icterus, sclera clear and conjunctiva pink.  Ears:  Normal auditory acuity. Nose:  No deformity, discharge,  or lesions. Mouth:  No deformity or lesions, oral mucosa pink.  Lungs:  Clear to auscultation bilaterally. No wheezes, rales, or rhonchi. No distress.  Heart:  S1, S2 present without murmurs appreciated.  Abdomen:  +BS, soft, non-tender and non-distended. No HSM noted. No guarding or rebound. No masses appreciated.  Rectal:  Deferred  Msk:  Symmetrical without gross deformities. Normal posture. Extremities:  Without clubbing or edema. Neurologic:  Alert and  oriented x4;  grossly normal neurologically. Skin:  Intact without significant lesions or rashes. Psych:  Alert and cooperative. Normal mood and affect.

## 2014-12-08 NOTE — Discharge Instructions (Signed)
Colonoscopy Discharge Instructions  Read the instructions outlined below and refer to this sheet in the next few weeks. These discharge instructions provide you with general information on caring for yourself after you leave the hospital. Your doctor may also give you specific instructions. While your treatment has been planned according to the most current medical practices available, unavoidable complications occasionally occur. If you have any problems or questions after discharge, call Dr. Gala Romney at [phone]. ACTIVITY  You may resume your regular activity, but move at a slower pace for the next 24 hours.   Take frequent rest periods for the next 24 hours.   Walking will help get rid of the air and reduce the bloated feeling in your belly (abdomen).   No driving for 24 hours (because of the medicine (anesthesia) used during the test).    Do not sign any important legal documents or operate any machinery for 24 hours (because of the anesthesia used during the test).  NUTRITION  Drink plenty of fluids.   You may resume your normal diet as instructed by your doctor.   Begin with a light meal and progress to your normal diet. Heavy or fried foods are harder to digest and may make you feel sick to your stomach (nauseated).   Avoid alcoholic beverages for 24 hours or as instructed.  MEDICATIONS  You may resume your normal medications unless your doctor tells you otherwise.  WHAT YOU CAN EXPECT TODAY  Some feelings of bloating in the abdomen.   Passage of more gas than usual.   Spotting of blood in your stool or on the toilet paper.  IF YOU HAD POLYPS REMOVED DURING THE COLONOSCOPY:  No aspirin products for 7 days or as instructed.   No alcohol for 7 days or as instructed.   Eat a soft diet for the next 24 hours.  FINDING OUT THE RESULTS OF YOUR TEST Not all test results are available during your visit. If your test results are not back during the visit, make an appointment  with your caregiver to find out the results. Do not assume everything is normal if you have not heard from your caregiver or the medical facility. It is important for you to follow up on all of your test results.  SEEK IMMEDIATE MEDICAL ATTENTION IF:  You have more than a spotting of blood in your stool.   Your belly is swollen (abdominal distention).   You are nauseated or vomiting.   You have a temperature over 101.   You have abdominal pain or discomfort that is severe or gets worse throughout the day.    Diverticulosis and polyp information provided  Further recommendations to follow pending review of pathology report  Colon Polyps Polyps are lumps of extra tissue growing inside the body. Polyps can grow in the large intestine (colon). Most colon polyps are noncancerous (benign). However, some colon polyps can become cancerous over time. Polyps that are larger than a pea may be harmful. To be safe, caregivers remove and test all polyps. CAUSES  Polyps form when mutations in the genes cause your cells to grow and divide even though no more tissue is needed. RISK FACTORS There are a number of risk factors that can increase your chances of getting colon polyps. They include:  Being older than 50 years.  Family history of colon polyps or colon cancer.  Long-term colon diseases, such as colitis or Crohn disease.  Being overweight.  Smoking.  Being inactive.  Drinking too much  alcohol. SYMPTOMS  Most small polyps do not cause symptoms. If symptoms are present, they may include:  Blood in the stool. The stool may look dark red or black.  Constipation or diarrhea that lasts longer than 1 week. DIAGNOSIS People often do not know they have polyps until their caregiver finds them during a regular checkup. Your caregiver can use 4 tests to check for polyps:  Digital rectal exam. The caregiver wears gloves and feels inside the rectum. This test would find polyps only in the  rectum.  Barium enema. The caregiver puts a liquid called barium into your rectum before taking X-rays of your colon. Barium makes your colon look white. Polyps are dark, so they are easy to see in the X-ray pictures.  Sigmoidoscopy. A thin, flexible tube (sigmoidoscope) is placed into your rectum. The sigmoidoscope has a light and tiny camera in it. The caregiver uses the sigmoidoscope to look at the last third of your colon.  Colonoscopy. This test is like sigmoidoscopy, but the caregiver looks at the entire colon. This is the most common method for finding and removing polyps. TREATMENT  Any polyps will be removed during a sigmoidoscopy or colonoscopy. The polyps are then tested for cancer. PREVENTION  To help lower your risk of getting more colon polyps:  Eat plenty of fruits and vegetables. Avoid eating fatty foods.  Do not smoke.  Avoid drinking alcohol.  Exercise every day.  Lose weight if recommended by your caregiver.  Eat plenty of calcium and folate. Foods that are rich in calcium include milk, cheese, and broccoli. Foods that are rich in folate include chickpeas, kidney beans, and spinach. HOME CARE INSTRUCTIONS Keep all follow-up appointments as directed by your caregiver. You may need periodic exams to check for polyps. SEEK MEDICAL CARE IF: You notice bleeding during a bowel movement. Document Released: 04/06/2004 Document Revised: 10/03/2011 Document Reviewed: 09/20/2011 [employer] Patient Information 2015 [place], [place]. This information is not intended to replace advice given to you by your health care provider. Make sure you discuss any questions you have with your health care provider. Diverticulosis Diverticulosis is the condition that develops when small pouches (diverticula) form in the wall of your colon. Your colon, or large intestine, is where water is absorbed and stool is formed. The pouches form when the inside layer of your colon pushes through weak spots in  the outer layers of your colon. CAUSES  No one knows exactly what causes diverticulosis. RISK FACTORS  Being older than 25. Your risk for this condition increases with age. Diverticulosis is rare in people younger than 40 years. By age 39, almost everyone has it.  Eating a low-fiber diet.  Being frequently constipated.  Being overweight.  Not getting enough exercise.  Smoking.  Taking over-the-counter pain medicines, like aspirin and ibuprofen. SYMPTOMS  Most people with diverticulosis do not have symptoms. DIAGNOSIS  Because diverticulosis often has no symptoms, health care providers often discover the condition during an exam for other colon problems. In many cases, a health care provider will diagnose diverticulosis while using a flexible scope to examine the colon (colonoscopy). TREATMENT  If you have never developed an infection related to diverticulosis, you may not need treatment. If you have had an infection before, treatment may include:  Eating more fruits, vegetables, and grains.  Taking a fiber supplement.  Taking a live bacteria supplement (probiotic).  Taking medicine to relax your colon. HOME CARE INSTRUCTIONS   Drink at least 6-8 glasses of water each day  to prevent constipation.  Try not to strain when you have a bowel movement.  Keep all follow-up appointments. If you have had an infection before:  Increase the fiber in your diet as directed by your health care provider or dietitian.  Take a dietary fiber supplement if your health care provider approves.  Only take medicines as directed by your health care provider. SEEK MEDICAL CARE IF:   You have abdominal pain.  You have bloating.  You have cramps.  You have not gone to the bathroom in 3 days. SEEK IMMEDIATE MEDICAL CARE IF:   Your pain gets worse.  Yourbloating becomes very bad.  You have a fever or chills, and your symptoms suddenly get worse.  You begin vomiting.  You have  bowel movements that are bloody or black. MAKE SURE YOU:  Understand these instructions.  Will watch your condition.  Will get help right away if you are not doing well or get worse. Document Released: 04/07/2004 Document Revised: 07/16/2013 Document Reviewed: 06/05/2013 [facility] Patient Information 2015 [place], [place]. This information is not intended to replace advice given to you by your health care provider. Make sure you discuss any questions you have with your health care provider.

## 2014-12-09 ENCOUNTER — Encounter (HOSPITAL_COMMUNITY): Payer: Self-pay | Admitting: Internal Medicine

## 2014-12-10 ENCOUNTER — Encounter: Payer: Self-pay | Admitting: Internal Medicine

## 2014-12-18 ENCOUNTER — Other Ambulatory Visit: Payer: Self-pay | Admitting: Cardiology

## 2014-12-19 ENCOUNTER — Other Ambulatory Visit (INDEPENDENT_AMBULATORY_CARE_PROVIDER_SITE_OTHER): Payer: 59 | Admitting: *Deleted

## 2014-12-19 ENCOUNTER — Encounter: Payer: Self-pay | Admitting: Cardiology

## 2014-12-19 ENCOUNTER — Ambulatory Visit (INDEPENDENT_AMBULATORY_CARE_PROVIDER_SITE_OTHER): Payer: 59 | Admitting: Cardiology

## 2014-12-19 VITALS — BP 136/88 | HR 62 | Ht 70.0 in | Wt 171.0 lb

## 2014-12-19 DIAGNOSIS — E7800 Pure hypercholesterolemia, unspecified: Secondary | ICD-10-CM

## 2014-12-19 DIAGNOSIS — E118 Type 2 diabetes mellitus with unspecified complications: Secondary | ICD-10-CM

## 2014-12-19 DIAGNOSIS — E1165 Type 2 diabetes mellitus with hyperglycemia: Secondary | ICD-10-CM

## 2014-12-19 DIAGNOSIS — IMO0002 Reserved for concepts with insufficient information to code with codable children: Secondary | ICD-10-CM

## 2014-12-19 DIAGNOSIS — I119 Hypertensive heart disease without heart failure: Secondary | ICD-10-CM

## 2014-12-19 DIAGNOSIS — I259 Chronic ischemic heart disease, unspecified: Secondary | ICD-10-CM | POA: Diagnosis not present

## 2014-12-19 LAB — BASIC METABOLIC PANEL
BUN: 16 mg/dL (ref 6–23)
CALCIUM: 9.6 mg/dL (ref 8.4–10.5)
CHLORIDE: 101 meq/L (ref 96–112)
CO2: 26 mEq/L (ref 19–32)
Creatinine, Ser: 0.95 mg/dL (ref 0.40–1.50)
GFR: 85.16 mL/min (ref >60.00)
Glucose, Bld: 153 mg/dL — ABNORMAL HIGH (ref 70–99)
Potassium: 4.4 mEq/L (ref 3.5–5.1)
SODIUM: 136 meq/L (ref 135–145)

## 2014-12-19 LAB — CBC WITH DIFFERENTIAL/PLATELET
Basophils Absolute: 0.1 10*3/uL (ref 0.0–0.1)
Basophils Relative: 0.8 % (ref 0.0–3.0)
EOS PCT: 2.7 % (ref 0.0–5.0)
Eosinophils Absolute: 0.2 10*3/uL (ref 0.0–0.7)
HCT: 42.4 % (ref 39.0–52.0)
HEMOGLOBIN: 14.4 g/dL (ref 13.0–17.0)
LYMPHS PCT: 33.7 % (ref 12.0–46.0)
Lymphs Abs: 2 10*3/uL (ref 0.7–4.0)
MCHC: 34 g/dL (ref 30.0–36.0)
MCV: 89.1 fl (ref 78.0–100.0)
MONOS PCT: 7.7 % (ref 3.0–12.0)
Monocytes Absolute: 0.5 10*3/uL (ref 0.1–1.0)
Neutro Abs: 3.4 10*3/uL (ref 1.4–7.7)
Neutrophils Relative %: 55.1 % (ref 43.0–77.0)
PLATELETS: 239 10*3/uL (ref 150.0–400.0)
RBC: 4.76 Mil/uL (ref 4.22–5.81)
RDW: 13 % (ref 11.5–15.5)
WBC: 6.1 10*3/uL (ref 4.0–10.5)

## 2014-12-19 LAB — LIPID PANEL
CHOLESTEROL: 223 mg/dL — AB (ref 0–200)
HDL: 48.1 mg/dL (ref >39.00)
NonHDL: 174.9
TRIGLYCERIDES: 216 mg/dL — AB (ref 0.0–149.0)
Total CHOL/HDL Ratio: 5
VLDL: 43.2 mg/dL — ABNORMAL HIGH (ref 0.0–40.0)

## 2014-12-19 LAB — HEPATIC FUNCTION PANEL
ALBUMIN: 4.6 g/dL (ref 3.5–5.2)
ALK PHOS: 40 U/L (ref 39–117)
ALT: 58 U/L — ABNORMAL HIGH (ref 0–53)
AST: 35 U/L (ref 0–37)
Bilirubin, Direct: 0.1 mg/dL (ref 0.0–0.3)
Total Bilirubin: 0.5 mg/dL (ref 0.2–1.2)
Total Protein: 7.5 g/dL (ref 6.0–8.3)

## 2014-12-19 LAB — LDL CHOLESTEROL, DIRECT: Direct LDL: 146 mg/dL

## 2014-12-19 LAB — HEMOGLOBIN A1C: HEMOGLOBIN A1C: 7.6 % — AB (ref 4.6–6.5)

## 2014-12-19 NOTE — Patient Instructions (Signed)
Medication Instructions:  Your physician recommends that you continue on your current medications as directed. Please refer to the Current Medication list given to you today.  Labwork: Lp/bmet/hfp/cbc/a1c  Testing/Procedures: none  Follow-Up: Your physician wants you to follow-up in: 4 months with fasting labs (lp/bmet/hfp/a1c)  You will receive a reminder letter in the mail two months in advance. If you don't receive a letter, please call our office to schedule the follow-up appointment.

## 2014-12-19 NOTE — Progress Notes (Signed)
Cardiology Office Note   Date:  12/19/2014   ID:  Eric Ball, Eric Ball 09/03/1950, MRN [id number]  PCP:  [name], MD  Cardiologist: [name] MD  No chief complaint on file.     History of Present Illness: Eric Ball is a 64 y.o. male who presents for scheduled four-month office visit.    [name], MD at 08/20/2014 8:06 AM     Status: Signed       Expand All Collapse All      Cardiology Office Note   Date: 08/20/2014   ID: Eric, Ball 1950/09/01, MRN [id number]  PCP: [name], MD Cardiologist: [name], MD   No chief complaint on file.    History of Present Illness: Eric Ball is a 64 y.o. male who presents for routine follow-up.  This pleasant 64 year old gentleman is seen for a scheduled followup office visit. He has a past history of known coronary disease, with a history of an acute lateral myocardial infarction 2004 followed by cardiac catheterization and PCI with prior bare-metal stents to the mid circumflex by [name]. He had done well in the ensuing 10 years until recently when he began to experience crescendo angina pectoris. He was hospitalized on 11/15/13 for cardiac catheterization. Cardiac catheterization was completed by [name] on 11/15/2013 demonstrating severe in-stent restenosis of the mid circumflex and 99%. Intervention was completed using a Resolute Integrity DES. He was recommended for dual antiplatelet therapy with aspirin and Plavix for one year, and to continue other medications prior to admission.   Of note, the patient had had issues with Plavix in the past causing nosebleeds, the patient has not had any recurrent problems with epistaxis this time. Other medical conditions include diabetes, hypertension, and hypercholesterolemia. Since last visit the patient has not been getting as much regular exercise. No chest pain or shortness of breath. No epistaxis.         The patient gets exercise by walking his cocker spaniel dog twice a day.  The patient no longer goes to the [facility]. And has been feeling well.  No chest pain or shortness of breath.  No dizziness.  No palpitations.  No epistaxis or bleeding problems.  Colonoscopy.  Precancerous polyps were seen.  He will get another colonoscopy in 5 years.   Past Medical History  Diagnosis Date  . Coronary artery disease 2015    PTCA with DES to Cx; PCI  BMS 2004  . Hypercholesterolemia     with intolerant ot many of the cholesterol medicaitons  . Ischemic heart disease   . Hypertension     essentai hypertension  . Complication of anesthesia     "I'm slow to come out"  . History of myocardial infarction     with a stent placed at that time  . Myocardial infarction 2004    with a stent placed at that time  . Sleep apnea     "suppose to wear mask; I don't wear it enough" (11/15/2013)  . Type II diabetes mellitus   . GERD (gastroesophageal reflux disease)   . H/O hiatal hernia   . Nephrosis  1960's    Past Surgical History  Procedure Laterality Date  . Nasal septum surgery  02/20/2001    Septal reconstruction and turbinate reduction  . Back surgery    . Colonoscopy  10/07/2011    [name]:multiple colonic polyps removed/colonic diverticulosis, tubular adenoma  . Lumbar disc surgery  1991  .  Coronary angioplasty with stent placement  09/25/2002    EF-approximately 45% / stent placement to the mid arteriovenous circumflex with reduction of 99% narrowing to 0% with placement of a 3.0 x 16 mm Express II stent with improvement of TIMI grade 2 to TIMI grade 3 flow.  . Coronary angioplasty with stent placement  11/15/2013    "1"  . Left heart catheterization with coronary angiogram N/A 11/15/2013    Procedure: LEFT HEART CATHETERIZATION WITH CORONARY ANGIOGRAM;  Surgeon: Burnell Blanks, MD;  Location: [facility] CATH LAB;  Service: Cardiovascular;  Laterality: N/A;  . Colonoscopy N/A 12/08/2014     Procedure: COLONOSCOPY;  Surgeon: [name], MD;  Location: [facility] ENDO SUITE;  Service: Endoscopy;  Laterality: N/A;  930     Current Outpatient Prescriptions  Medication Sig Dispense Refill  . aspirin 81 MG tablet Take 81 mg by mouth daily.      . carvedilol (COREG) 12.5 MG tablet Take 1 tablet (12.5 mg total) by mouth 2 (two) times daily with a meal. 180 tablet 3  . clopidogrel (PLAVIX) 75 MG tablet Take 1 tablet (75 mg total) by mouth daily with breakfast. 90 tablet 2  . colesevelam (WELCHOL) 625 MG tablet Take 3 tablets (1,875 mg total) by mouth 2 (two) times daily with a meal. 540 tablet 2  . CRESTOR 5 MG tablet TAKE 1 TABLET EVERY OTHER  DAY 45 tablet 3  . metFORMIN (GLUCOPHAGE) 1000 MG tablet Take 1 tablet (1,000 mg total) by mouth 2 (two) times daily with a meal. 180 tablet 3  . omega-3 acid ethyl esters (LOVAZA) 1 G capsule Take 2 capsules (2 g total) by mouth 2 (two) times daily. 360 capsule 3  . pantoprazole (PROTONIX) 40 MG tablet Take 1 tablet (40 mg total) by mouth daily. 90 tablet 3  . valsartan (DIOVAN) 160 MG tablet Take 160 mg by mouth daily.    . [DISCONTINUED] Omeprazole Magnesium (PRILOSEC OTC PO) Take 1 capsule by mouth daily. Taking  Every other day     No current facility-administered medications for this visit.    Allergies:   Beta adrenergic blockers    Social History:  The patient  reports that he has never smoked. He has never used smokeless tobacco. He reports that he does not drink alcohol or use illicit drugs.   Family History:  The patient's family history includes Coronary artery disease in his father; Healthy in his sister; Hypertension in an other family member. There is no history of Colon cancer.    ROS:  Please see the history of present illness.   Otherwise, review of systems are positive for none.   All other systems are reviewed and negative.    PHYSICAL EXAM: VS:  BP 136/88 mmHg  Pulse 62  Ht 5\' 10"  (1.778 m)  Wt 171 lb (77.565 kg)  BMI  24.54 kg/m2 , BMI Body mass index is 24.54 kg/(m^2). GEN: Well nourished, well developed, in no acute distress HEENT: normal Neck: no JVD, carotid bruits, or masses Cardiac: RRR; no murmurs, rubs, or gallops,no edema  Respiratory:  clear to auscultation bilaterally, normal work of breathing GI: soft, nontender, nondistended, + BS MS: no deformity or atrophy Skin: warm and dry, no rash Neuro:  Strength and sensation are intact Psych: euthymic mood, full affect   EKG:  EKG is ordered today. The ekg ordered today demonstrates normal sinus rhythm.  No ischemic changes.   Recent Labs: 08/20/2014: ALT 54*; BUN 17; Creatinine 1.04; Potassium  4.6; Sodium 135    Lipid Panel    Component Value Date/Time   CHOL 214* 08/20/2014 0756   TRIG 236.0* 08/20/2014 0756   HDL 43.60 08/20/2014 0756   CHOLHDL 5 08/20/2014 0756   VLDL 47.2* 08/20/2014 0756   LDLCALC 147* 10/16/2013 0850   LDLDIRECT 150.0 08/20/2014 0756      Wt Readings from Last 3 Encounters:  12/19/14 171 lb (77.565 kg)  11/14/14 170 lb (77.111 kg)  08/20/14 169 lb 1.9 oz (76.712 kg)         ASSESSMENT AND PLAN: 1. ischemic heart disease status post PCI on 11/15/13 with a drug-eluting stent for in-stent restenosis of circumflex. No recurrent angina. 2. hypertensive cardiovascular disease without heart failure 3. Dyslipidemia. Tolerating statin therapy. No myalgias. 4. diabetes mellitus type 2. No hypoglycemic reaction. 5. past history of epistaxis on Plavix and aspirin. No recurrent epistaxis has occurred.   Current medicines are reviewed at length with the patient today. The patient does not have concerns regarding medicines.  The following changes have been made: no change  Labs/ tests ordered today include: Lipid panel, hepatic function panel, and basal metabolic panel, and L4T     Current medicines are reviewed at length with the patient today.  The patient does not have concerns regarding  medicines.  The following changes have been made:  no change  Labs/ tests ordered today include:   Orders Placed This Encounter  Procedures  . Lipid panel  . Hepatic function panel  . Basic metabolic panel  . Hemoglobin A1c  . EKG 12-Lead     Disposition.  Continue current medication.  Recheck in 4 months for office visit lipid panel hepatic function panel basal metabolic panel and hemoglobin A1c. Continue current medication.  Continue regular walking exercise.  Berna Spare MD 12/19/2014 8:49 AM    [address], [place], [place]  [address] Phone: [phone]; Fax: [phone]

## 2014-12-19 NOTE — Addendum Note (Signed)
Addended by: Eulis Foster on: 12/19/2014 08:59 AM   Modules accepted: Orders

## 2014-12-22 NOTE — Progress Notes (Signed)
Quick Note:  Please report to patient. The recent labs are stable. Continue same medication and careful diet. The A1c is 7.6 which is improved. ______

## 2014-12-29 ENCOUNTER — Other Ambulatory Visit: Payer: Self-pay | Admitting: *Deleted

## 2014-12-29 MED ORDER — VALSARTAN 160 MG PO TABS: 160.0000 mg | ORAL_TABLET | Freq: Every day | ORAL | Status: DC

## 2014-12-29 MED ORDER — CARVEDILOL 12.5 MG PO TABS: 12.5000 mg | ORAL_TABLET | Freq: Two times a day (BID) | ORAL | Status: DC

## 2015-01-19 ENCOUNTER — Other Ambulatory Visit: Payer: Self-pay

## 2015-02-02 ENCOUNTER — Other Ambulatory Visit: Payer: Self-pay | Admitting: *Deleted

## 2015-02-02 MED ORDER — METFORMIN HCL 1000 MG PO TABS: 1000.0000 mg | ORAL_TABLET | Freq: Two times a day (BID) | ORAL | Status: DC

## 2015-02-20 ENCOUNTER — Other Ambulatory Visit: Payer: Self-pay | Admitting: Cardiology

## 2015-04-29 ENCOUNTER — Encounter: Payer: Self-pay | Admitting: Cardiology

## 2015-05-20 ENCOUNTER — Ambulatory Visit: Payer: 59 | Admitting: Cardiology

## 2015-05-20 ENCOUNTER — Other Ambulatory Visit: Payer: 59

## 2015-05-28 ENCOUNTER — Other Ambulatory Visit (INDEPENDENT_AMBULATORY_CARE_PROVIDER_SITE_OTHER): Payer: Commercial Managed Care - HMO | Admitting: *Deleted

## 2015-05-28 ENCOUNTER — Ambulatory Visit (INDEPENDENT_AMBULATORY_CARE_PROVIDER_SITE_OTHER): Payer: Commercial Managed Care - HMO | Admitting: Cardiology

## 2015-05-28 ENCOUNTER — Encounter: Payer: Self-pay | Admitting: Cardiology

## 2015-05-28 VITALS — BP 134/90 | HR 74 | Ht 70.0 in | Wt 169.1 lb

## 2015-05-28 DIAGNOSIS — R739 Hyperglycemia, unspecified: Secondary | ICD-10-CM

## 2015-05-28 DIAGNOSIS — E1142 Type 2 diabetes mellitus with diabetic polyneuropathy: Secondary | ICD-10-CM

## 2015-05-28 DIAGNOSIS — E7800 Pure hypercholesterolemia, unspecified: Secondary | ICD-10-CM | POA: Diagnosis not present

## 2015-05-28 DIAGNOSIS — I119 Hypertensive heart disease without heart failure: Secondary | ICD-10-CM

## 2015-05-28 DIAGNOSIS — I259 Chronic ischemic heart disease, unspecified: Secondary | ICD-10-CM | POA: Diagnosis not present

## 2015-05-28 DIAGNOSIS — I10 Essential (primary) hypertension: Secondary | ICD-10-CM

## 2015-05-28 LAB — LIPID PANEL
Cholesterol: 201 mg/dL — ABNORMAL HIGH (ref 125–200)
HDL: 39 mg/dL — ABNORMAL LOW (ref >=40)
LDL Cholesterol: 126 mg/dL (ref <130)
TRIGLYCERIDES: 180 mg/dL — AB (ref <150)
Total CHOL/HDL Ratio: 5.2 Ratio — ABNORMAL HIGH (ref <=5.0)
VLDL: 36 mg/dL — AB (ref <30)

## 2015-05-28 LAB — HEPATIC FUNCTION PANEL
ALT: 61 U/L — ABNORMAL HIGH (ref 9–46)
AST: 58 U/L — ABNORMAL HIGH (ref 10–35)
Albumin: 4.2 g/dL (ref 3.6–5.1)
Alkaline Phosphatase: 36 U/L — ABNORMAL LOW (ref 40–115)
BILIRUBIN DIRECT: 0.1 mg/dL (ref <=0.2)
Indirect Bilirubin: 0.3 mg/dL (ref 0.2–1.2)
TOTAL PROTEIN: 6.9 g/dL (ref 6.1–8.1)
Total Bilirubin: 0.4 mg/dL (ref 0.2–1.2)

## 2015-05-28 LAB — BASIC METABOLIC PANEL
BUN: 14 mg/dL (ref 7–25)
CO2: 20 mmol/L (ref 20–31)
Calcium: 9.2 mg/dL (ref 8.6–10.3)
Chloride: 103 mmol/L (ref 98–110)
Creat: 1.03 mg/dL (ref 0.70–1.25)
GLUCOSE: 147 mg/dL — AB (ref 65–99)
POTASSIUM: 4.3 mmol/L (ref 3.5–5.3)
Sodium: 135 mmol/L (ref 135–146)

## 2015-05-28 MED ORDER — CARVEDILOL 12.5 MG PO TABS: 12.5000 mg | ORAL_TABLET | Freq: Two times a day (BID) | ORAL | Status: DC

## 2015-05-28 MED ORDER — ROSUVASTATIN CALCIUM 5 MG PO TABS: 5.0000 mg | ORAL_TABLET | ORAL | Status: DC

## 2015-05-28 MED ORDER — PANTOPRAZOLE SODIUM 40 MG PO TBEC: 40.0000 mg | DELAYED_RELEASE_TABLET | Freq: Every day | ORAL | Status: DC

## 2015-05-28 MED ORDER — CLOPIDOGREL BISULFATE 75 MG PO TABS: 75.0000 mg | ORAL_TABLET | Freq: Every day | ORAL | Status: DC

## 2015-05-28 MED ORDER — VALSARTAN 160 MG PO TABS: 160.0000 mg | ORAL_TABLET | Freq: Every day | ORAL | Status: DC

## 2015-05-28 MED ORDER — OMEGA-3-ACID ETHYL ESTERS 1 G PO CAPS: 2.0000 g | ORAL_CAPSULE | Freq: Two times a day (BID) | ORAL | Status: DC

## 2015-05-28 NOTE — Patient Instructions (Addendum)
Medication Instructions:  Your physician recommends that you continue on your current medications as directed. Please refer to the Current Medication list given to you today.  Labwork: none  Testing/Procedures: none  Follow-Up: Your physician recommends that you schedule a follow-up appointment in: 4 months with fasting labs (lp/bmet/hfp/a1c) with Dr Stanford Breed   If you need a refill on your cardiac medications before your next appointment, please call your pharmacy.

## 2015-05-28 NOTE — Addendum Note (Signed)
Addended by: Eulis Foster on: 05/28/2015 10:36 AM   Modules accepted: Orders

## 2015-05-28 NOTE — Progress Notes (Signed)
Cardiology Office Note   Date:  05/28/2015   ID:  Eric Ball, DOB 31-Dec-1950, MRN [id number]  PCP:  [name], MD  Cardiologist: [name] MD  Chief Complaint  Patient presents with  . Hypertension       History of Present Illness: Eric Ball is a 64 y.o. male who presents for a four-month follow-up office visit This pleasant 64 year old gentleman is seen for a scheduled followup office visit. He has a past history of known coronary disease, with a history of an acute lateral myocardial infarction 2004 followed by cardiac catheterization and PCI with prior bare-metal stents to the mid circumflex by [name]. He had done well for the ensuing 10 years until April 2015 when he began to experience crescendo angina pectoris. He was hospitalized on 11/15/13 for cardiac catheterization. Cardiac catheterization was completed by [name] on 11/15/2013 demonstrating severe in-stent restenosis of the mid circumflex and 99%. Intervention was completed using a Resolute Integrity DES. He was recommended for dual antiplatelet therapy with aspirin and Plavix for one year, and to continue other medications prior to admission.   Of note, the patient had had issues with Plavix in the past causing nosebleeds, the patient has not had any recurrent problems with epistaxis this time. Other medical conditions include diabetes, hypertension, and hypercholesterolemia. Since last visit the patient has not been getting as much regular exercise. No chest pain or shortness of breath. No epistaxis.His blood pressure is higher today but he did not take any of his home blood pressure medicines prior to coming to the office. He has been under more recent stress.  His father is currently hospitalized for a heart attack.   Past Medical History  Diagnosis Date  . Coronary artery disease 2015    PTCA with DES to Cx; PCI  BMS 2004  . Hypercholesterolemia     with intolerant ot many of  the cholesterol medicaitons  . Ischemic heart disease   . Hypertension     essentai hypertension  . Complication of anesthesia     "I'm slow to come out"  . History of myocardial infarction     with a stent placed at that time  . Myocardial infarction Eric Ball) 2004    with a stent placed at that time  . Sleep apnea     "suppose to wear mask; I don't wear it enough" (11/15/2013)  . Type II diabetes mellitus (Eric Ball)   . GERD (gastroesophageal reflux disease)   . H/O hiatal hernia   . Nephrosis  1960's    Past Surgical History  Procedure Laterality Date  . Nasal septum surgery  02/20/2001    Septal reconstruction and turbinate reduction  . Back surgery    . Colonoscopy  10/07/2011    [name]:multiple colonic polyps removed/colonic diverticulosis, tubular adenoma  . Lumbar disc surgery  1991  . Coronary angioplasty with stent placement  09/25/2002    EF-approximately 45% / stent placement to the mid arteriovenous circumflex with reduction of 99% narrowing to 0% with placement of a 3.0 x 16 mm Express II stent with improvement of TIMI grade 2 to TIMI grade 3 flow.  . Coronary angioplasty with stent placement  11/15/2013    "1"  . Left heart catheterization with coronary angiogram N/A 11/15/2013    Procedure: LEFT HEART CATHETERIZATION WITH CORONARY ANGIOGRAM;  Surgeon: Burnell Blanks, MD;  Location: [facility] CATH LAB;  Service: Cardiovascular;  Laterality: N/A;  . Colonoscopy N/A 12/08/2014  Procedure: COLONOSCOPY;  Surgeon: [name], MD;  Location: [facility] ENDO SUITE;  Service: Endoscopy;  Laterality: N/A;  930     Current Outpatient Prescriptions  Medication Sig Dispense Refill  . clopidogrel (PLAVIX) 75 MG tablet Take 1 tablet (75 mg total) by mouth daily. 90 tablet 1  . rosuvastatin (CRESTOR) 5 MG tablet Take 1 tablet (5 mg total) by mouth every other day. 90 tablet 1  . aspirin 81 MG tablet Take 81 mg by mouth daily.      . carvedilol (COREG) 12.5 MG tablet Take 1 tablet (12.5  mg total) by mouth 2 (two) times daily with a meal. 180 tablet 1  . colesevelam (WELCHOL) 625 MG tablet Take 3 tablets (1,875 mg total) by mouth 2 (two) times daily with a meal. 540 tablet 2  . metFORMIN (GLUCOPHAGE) 1000 MG tablet Take 1 tablet (1,000 mg total) by mouth 2 (two) times daily with a meal. 180 tablet 3  . omega-3 acid ethyl esters (LOVAZA) 1 G capsule Take 2 capsules (2 g total) by mouth 2 (two) times daily. 360 capsule 1  . pantoprazole (PROTONIX) 40 MG tablet Take 1 tablet (40 mg total) by mouth daily. 90 tablet 1  . valsartan (DIOVAN) 160 MG tablet Take 1 tablet (160 mg total) by mouth daily. 90 tablet 1  . [DISCONTINUED] Omeprazole Magnesium (PRILOSEC OTC PO) Take 1 capsule by mouth daily. Taking  Every other day     No current facility-administered medications for this visit.    Allergies:   Beta adrenergic blockers    Social History:  The patient  reports that he has never smoked. He has never used smokeless tobacco. He reports that he does not drink alcohol or use illicit drugs.   Family History:  The patient's family history includes Coronary artery disease in his father; Healthy in his sister; Hypertension in an other family member. There is no history of Colon cancer.    ROS:  Please see the history of present illness.   Otherwise, review of systems are positive for none.   All other systems are reviewed and negative.    PHYSICAL EXAM: VS:  BP 134/90 mmHg  Pulse 74  Ht 5\' 10"  (1.778 m)  Wt 169 lb 1.9 oz (76.712 kg)  BMI 24.27 kg/m2 , BMI Body mass index is 24.27 kg/(m^2). GEN: Well nourished, well developed, in no acute distress HEENT: normal Neck: no JVD, carotid bruits, or masses Cardiac: RRR; no murmurs, rubs, or gallops,no edema  Respiratory:  clear to auscultation bilaterally, normal work of breathing GI: soft, nontender, nondistended, + BS MS: no deformity or atrophy Skin: warm and dry, no rash Neuro:  Strength and sensation are intact Psych:  euthymic mood, full affect   EKG:  EKG is ordered today. The ekg ordered today demonstrates normal sinus rhythm.  No ischemic changes.   Recent Labs: 12/19/2014: ALT 58*; BUN 16; Creatinine, Ser 0.95; Hemoglobin 14.4; Platelets 239.0; Potassium 4.4; Sodium 136    Lipid Panel    Component Value Date/Time   CHOL 223* 12/19/2014 0827   TRIG 216.0* 12/19/2014 0827   HDL 48.10 12/19/2014 0827   CHOLHDL 5 12/19/2014 0827   VLDL 43.2* 12/19/2014 0827   LDLCALC 147* 10/16/2013 0850   LDLDIRECT 146.0 12/19/2014 0827      Wt Readings from Last 3 Encounters:  05/28/15 169 lb 1.9 oz (76.712 kg)  12/19/14 171 lb (77.565 kg)  11/14/14 170 lb (77.111 kg)  ASSESSMENT AND PLAN:  1. ischemic heart disease status post PCI on 11/15/13 with a drug-eluting stent for in-stent restenosis of circumflex. No recurrent angina. 2. hypertensive cardiovascular disease without heart failure 3. Dyslipidemia. Tolerating statin therapy. No myalgias. 4. diabetes mellitus type 2. No hypoglycemic reaction. 5. past history of epistaxis on Plavix and aspirin. No recurrent epistaxis has occurred.   Current medicines are reviewed at length with the patient today.  The patient does not have concerns regarding medicines.  The following changes have been made:  no change  Labs/ tests ordered today include:   Orders Placed This Encounter  Procedures  . EKG 12-Lead     Disposition: Continue on current medication.  We refilled his cardiac medications today.  He will return in 4 months for follow-up office visit with Dr. Stanford Breed.  Blood work today is pending.  Berna Spare MD 05/28/2015 5:26 PM    [address], [place], [place]  [address] Phone: [phone]; Fax: [phone]

## 2015-05-28 NOTE — Addendum Note (Signed)
Addended by: Eulis Foster on: 05/28/2015 10:34 AM   Modules accepted: Orders

## 2015-05-29 LAB — HEMOGLOBIN A1C
Hgb A1c MFr Bld: 7.7 % — ABNORMAL HIGH (ref <5.7)
Mean Plasma Glucose: 174 mg/dL — ABNORMAL HIGH (ref <117)

## 2015-05-29 NOTE — Progress Notes (Signed)
Quick Note:  Please report to patient. The recent labs are stable. Continue same medication and careful diet. Lipids are slightly better. LFTs are slightly higher but still acceptable. CSD. ______

## 2015-09-28 NOTE — Progress Notes (Signed)
HPI: FU CAD; Previously followed by Dr. Mare Ferrari; history of acute lateral myocardial infarction 2004 followed by cardiac catheterization and PCI with bare-metal stents to the mid circumflex. Cardiac catheterization 11/15/2013 showed no obstructive disease in the LAD, 99% in-stent restenosis in the circumflex. There was a small obtuse marginal jailed by the stent with an ostial 90% lesion. There is a 60% mid RCA. Ejection fraction 65%. Intervention was completed using a Resolute Integrity DES. Last seen November 2016. Since then, the patient has dyspnea with more extreme activities but not with routine activities. It is relieved with rest. It is not associated with chest pain. There is no orthopnea, PND or pedal edema. There is no syncope or palpitations. There is no exertional chest pain.   Current Outpatient Prescriptions  Medication Sig Dispense Refill  . aspirin 81 MG tablet Take 81 mg by mouth daily.      . carvedilol (COREG) 12.5 MG tablet Take 1 tablet (12.5 mg total) by mouth 2 (two) times daily with a meal. 180 tablet 1  . clopidogrel (PLAVIX) 75 MG tablet Take 1 tablet (75 mg total) by mouth daily. 90 tablet 1  . colesevelam (WELCHOL) 625 MG tablet Take 3 tablets (1,875 mg total) by mouth 2 (two) times daily with a meal. 540 tablet 2  . metFORMIN (GLUCOPHAGE) 1000 MG tablet Take 1 tablet (1,000 mg total) by mouth 2 (two) times daily with a meal. 180 tablet 3  . omega-3 acid ethyl esters (LOVAZA) 1 G capsule Take 2 capsules (2 g total) by mouth 2 (two) times daily. 360 capsule 1  . pantoprazole (PROTONIX) 40 MG tablet Take 1 tablet (40 mg total) by mouth daily. 90 tablet 1  . rosuvastatin (CRESTOR) 5 MG tablet Take 1 tablet (5 mg total) by mouth every other day. 90 tablet 1  . valsartan (DIOVAN) 160 MG tablet Take 1 tablet (160 mg total) by mouth daily. 90 tablet 1  . [DISCONTINUED] Omeprazole Magnesium (PRILOSEC OTC PO) Take 1 capsule by mouth daily. Taking  Every other day      No current facility-administered medications for this visit.     Past Medical History  Diagnosis Date  . Coronary artery disease 2015    PTCA with DES to Cx; PCI  BMS 2004  . Hypercholesterolemia     with intolerant ot many of the cholesterol medicaitons  . Ischemic heart disease   . Hypertension     essentai hypertension  . Complication of anesthesia     "I'm slow to come out"  . History of myocardial infarction     with a stent placed at that time  . Myocardial infarction [facility]) 2004    with a stent placed at that time  . Sleep apnea     "suppose to wear mask; I don't wear it enough" (11/15/2013)  . Type II diabetes mellitus ([place])   . GERD (gastroesophageal reflux disease)   . H/O hiatal hernia   . Nephrosis  1960's    Past Surgical History  Procedure Laterality Date  . Nasal septum surgery  02/20/2001    Septal reconstruction and turbinate reduction  . Back surgery    . Colonoscopy  10/07/2011    [name]:multiple colonic polyps removed/colonic diverticulosis, tubular adenoma  . Lumbar disc surgery  1991  . Coronary angioplasty with stent placement  09/25/2002    EF-approximately 45% / stent placement to the mid arteriovenous circumflex with reduction of 99% narrowing to 0% with placement  of a 3.0 x 16 mm Express II stent with improvement of TIMI grade 2 to TIMI grade 3 flow.  . Coronary angioplasty with stent placement  11/15/2013    "1"  . Left heart catheterization with coronary angiogram N/A 11/15/2013    Procedure: LEFT HEART CATHETERIZATION WITH CORONARY ANGIOGRAM;  Surgeon: Burnell Blanks, MD;  Location: [facility] CATH LAB;  Service: Cardiovascular;  Laterality: N/A;  . Colonoscopy N/A 12/08/2014    Procedure: COLONOSCOPY;  Surgeon: [name], MD;  Location: [facility]O [facility];  Service: Endoscopy;  Laterality: N/A;  930    Social History   Social History  . Marital Status: Married    Spouse Name: N/A  . Number of Children: N/A  . Years of Education: N/A    Occupational History  . Not on file.   Social History Main Topics  . Smoking status: Never Smoker   . Smokeless tobacco: Never Used     Comment: Never smoker  . Alcohol Use: No  . Drug Use: No  . Sexual Activity: Yes   Other Topics Concern  . Not on file   Social History Narrative    Family History  Problem Relation Age of Onset  . Coronary artery disease Father     with multiple angioplasties  . Hypertension      runs in the family  . Colon cancer Neg Hx   . Healthy Sister     ROS: no fevers or chills, productive cough, hemoptysis, dysphasia, odynophagia, melena, hematochezia, dysuria, hematuria, rash, seizure activity, orthopnea, PND, pedal edema, claudication. Remaining systems are negative.  Physical Exam: Well-developed well-nourished in no acute distress.  Skin is warm and dry.  HEENT is normal.  Neck is supple.  Chest is clear to auscultation with normal expansion.  Cardiovascular exam is regular rate and rhythm.  Abdominal exam nontender or distended. No masses palpated. Extremities show no edema. neuro grossly intact  ECG Normal sinus rhythm at a rate of 66. No ST changes.

## 2015-10-05 ENCOUNTER — Ambulatory Visit (INDEPENDENT_AMBULATORY_CARE_PROVIDER_SITE_OTHER): Payer: Commercial Managed Care - HMO | Admitting: Cardiology

## 2015-10-05 ENCOUNTER — Encounter: Payer: Self-pay | Admitting: Cardiology

## 2015-10-05 VITALS — BP 130/82 | HR 66 | Ht 70.0 in | Wt 169.2 lb

## 2015-10-05 DIAGNOSIS — I10 Essential (primary) hypertension: Secondary | ICD-10-CM | POA: Diagnosis not present

## 2015-10-05 DIAGNOSIS — I2510 Atherosclerotic heart disease of native coronary artery without angina pectoris: Secondary | ICD-10-CM

## 2015-10-05 DIAGNOSIS — E7800 Pure hypercholesterolemia, unspecified: Secondary | ICD-10-CM

## 2015-10-05 DIAGNOSIS — I2583 Coronary atherosclerosis due to lipid rich plaque: Principal | ICD-10-CM

## 2015-10-05 LAB — BASIC METABOLIC PANEL
BUN: 15 mg/dL (ref 7–25)
CALCIUM: 9.8 mg/dL (ref 8.6–10.3)
CO2: 23 mmol/L (ref 20–31)
CREATININE: 0.95 mg/dL (ref 0.70–1.25)
Chloride: 101 mmol/L (ref 98–110)
Glucose, Bld: 181 mg/dL — ABNORMAL HIGH (ref 65–99)
Potassium: 4.9 mmol/L (ref 3.5–5.3)
SODIUM: 137 mmol/L (ref 135–146)

## 2015-10-05 LAB — HEPATIC FUNCTION PANEL
ALK PHOS: 40 U/L (ref 40–115)
ALT: 75 U/L — ABNORMAL HIGH (ref 9–46)
AST: 48 U/L — AB (ref 10–35)
Albumin: 4.7 g/dL (ref 3.6–5.1)
BILIRUBIN DIRECT: 0.1 mg/dL (ref <=0.2)
BILIRUBIN TOTAL: 0.6 mg/dL (ref 0.2–1.2)
Indirect Bilirubin: 0.5 mg/dL (ref 0.2–1.2)
Total Protein: 7.5 g/dL (ref 6.1–8.1)

## 2015-10-05 LAB — LIPID PANEL
Cholesterol: 255 mg/dL — ABNORMAL HIGH (ref 125–200)
HDL: 41 mg/dL (ref >=40)
LDL CALC: 162 mg/dL — AB (ref <130)
Total CHOL/HDL Ratio: 6.2 Ratio — ABNORMAL HIGH (ref <=5.0)
Triglycerides: 258 mg/dL — ABNORMAL HIGH (ref <150)
VLDL: 52 mg/dL — ABNORMAL HIGH (ref <30)

## 2015-10-05 NOTE — Assessment & Plan Note (Signed)
Blood pressure controlled. Continue present medications. 

## 2015-10-05 NOTE — Assessment & Plan Note (Signed)
Continue aspirin and statin. It has been greater than 1 year since his previous intervention. Discontinue Plavix.

## 2015-10-05 NOTE — Patient Instructions (Signed)
Stop Plavix   Lab Work [employer] )    Your physician wants you to follow-up in: 6 months. You will receive a reminder letter in the mail two months in advance. If you don't receive a letter, please call our office to schedule the follow-up appointment.

## 2015-10-05 NOTE — Assessment & Plan Note (Signed)
Patient has not tolerated other statins previously. We will continue Crestor at present dose and he did not tolerate higher doses previously. He did have increased liver functions on most recent laboratories. Check lipids and liver. He also did not tolerate zetia. Could consider repatha in the future if needed.

## 2015-10-07 ENCOUNTER — Telehealth: Payer: Self-pay | Admitting: *Deleted

## 2015-10-07 NOTE — Telephone Encounter (Signed)
-----   Message from Lelon Perla, MD sent at 10/05/2015  4:03 PM EDT ----- Dc crestor, refer to lipid clinic, pt intolerant to other statins and zetia [name]

## 2015-10-07 NOTE — Telephone Encounter (Signed)
Spoke with pt, aware of results. Patient voiced understanding to stop crestor. Follow up scheduled in lipid clinic at [address]

## 2015-10-12 ENCOUNTER — Ambulatory Visit: Payer: Commercial Managed Care - HMO | Admitting: Pharmacist

## 2015-10-26 ENCOUNTER — Ambulatory Visit (INDEPENDENT_AMBULATORY_CARE_PROVIDER_SITE_OTHER): Payer: Commercial Managed Care - HMO | Admitting: Pharmacist

## 2015-10-26 DIAGNOSIS — E7800 Pure hypercholesterolemia, unspecified: Secondary | ICD-10-CM

## 2015-10-26 NOTE — Progress Notes (Signed)
Patient ID: Eric Ball                 DOB: 06-Mar-1951, 65 yo                         MRN: [id number]     HPI: Eric Ball is a 65 y.o. male patient referred to lipid clinic by Dr. Stanford Ball. PMH is significant for CAD, MI s/p PCI with BMS in 2004, HTN, unstable angina, and DM2. Patient has a history of statin intolerance with Lipitor and Crestor as well as Zetia. He has had elevated LFTs on low dose Crestor and presents today for lipid management.  Current Medications: Welchol 1825mg  BID, fish oil 2g BID Intolerances: Crestor 5mg  every other day - elevated LFTs. Zetia and Lipitor caused muscle aches that resolved upon discontinuation. Risk Factors: MI s/p PCI in 2004 with 99% in-stent restenosis of circumflex in 2015 LDL goal: < 70mg /dL  Family History: Father with CAD and multiple angioplasties, HTN runs in the family.  Social History: Patient reports that he has never smoked, does not drink alcohol, and does not use illicit drugs.  Labs: 09/2015: TC 255, TG 258, HDL 41, LDL 162, AST 48, ALT 75 (on Crestor 5mg  every other day)   Past Medical History  Diagnosis Date  . Coronary artery disease 2015    PTCA with DES to Cx; PCI  BMS 2004  . Hypercholesterolemia     with intolerant ot many of the cholesterol medicaitons  . Ischemic heart disease   . Hypertension     essentai hypertension  . Complication of anesthesia     "I'm slow to come out"  . History of myocardial infarction     with a stent placed at that time  . Myocardial infarction [facility]) 2004    with a stent placed at that time  . Sleep apnea     "suppose to wear mask; I don't wear it enough" (11/15/2013)  . Type II diabetes mellitus ([place])   . GERD (gastroesophageal reflux disease)   . H/O hiatal hernia   . Nephrosis  1960's    Current Outpatient Prescriptions on File Prior to Visit  Medication Sig Dispense Refill  . aspirin 81 MG tablet Take 81 mg by mouth daily.      . carvedilol (COREG) 12.5 MG tablet Take 1  tablet (12.5 mg total) by mouth 2 (two) times daily with a meal. 180 tablet 1  . clopidogrel (PLAVIX) 75 MG tablet Take 1 tablet (75 mg total) by mouth daily. 90 tablet 1  . colesevelam (WELCHOL) 625 MG tablet Take 3 tablets (1,875 mg total) by mouth 2 (two) times daily with a meal. 540 tablet 2  . metFORMIN (GLUCOPHAGE) 1000 MG tablet Take 1 tablet (1,000 mg total) by mouth 2 (two) times daily with a meal. 180 tablet 3  . omega-3 acid ethyl esters (LOVAZA) 1 G capsule Take 2 capsules (2 g total) by mouth 2 (two) times daily. 360 capsule 1  . pantoprazole (PROTONIX) 40 MG tablet Take 1 tablet (40 mg total) by mouth daily. 90 tablet 1  . valsartan (DIOVAN) 160 MG tablet Take 1 tablet (160 mg total) by mouth daily. 90 tablet 1  . [DISCONTINUED] Omeprazole Magnesium (PRILOSEC OTC PO) Take 1 capsule by mouth daily. Taking  Every other day     No current facility-administered medications on file prior to visit.    Allergies  Allergen Reactions  .  Beta Adrenergic Blockers Other (See Comments)    High doses make joints hurt.    Assessment/Plan:  1. Hyperlipidemia - LDL gola < 70 given history of ASCVD s/p MI and PCI with BMS. Patient's LDL was elevated to 162 on Crestor 5mg  every other day. Pt has been intolerant to higher doses of Crestor, Lipitor, and Zetia with myalgias that resolved upon discontinuation each time. Most recently had Crestor 5mg  discontinued due to elevated LFTs. Pt currently takes fish oil and Welchol. Needs PCSK9i to bring LDL to goal. Discussed benefits, side effect profile, and injection technique for Repatha. Will initiate paperwork for Repatha coverage.   Eric Ball E. Eric Ball, PharmD, [place] [id number], [place], [place] [address] Phone: [phone]; Fax: [phone] 10/26/2015 4:42 PM

## 2015-10-27 ENCOUNTER — Other Ambulatory Visit: Payer: Self-pay | Admitting: Pharmacist

## 2015-10-27 DIAGNOSIS — E7800 Pure hypercholesterolemia, unspecified: Secondary | ICD-10-CM

## 2015-10-27 MED ORDER — EVOLOCUMAB 140 MG/ML ~~LOC~~ SOAJ: 1.0000 "pen " | SUBCUTANEOUS | Status: DC

## 2015-11-06 ENCOUNTER — Other Ambulatory Visit: Payer: Self-pay | Admitting: Pharmacist

## 2015-11-06 ENCOUNTER — Telehealth: Payer: Self-pay | Admitting: Pharmacist

## 2015-11-06 DIAGNOSIS — E7800 Pure hypercholesterolemia, unspecified: Secondary | ICD-10-CM

## 2015-11-06 NOTE — Telephone Encounter (Signed)
Pt called.  He received his shipment of Repatha and is doing his first dose today.  Appt made to recheck lipids in 2 months.

## 2015-11-13 ENCOUNTER — Other Ambulatory Visit: Payer: Self-pay | Admitting: Pharmacist

## 2015-11-19 ENCOUNTER — Telehealth: Payer: Self-pay | Admitting: Cardiology

## 2015-11-19 NOTE — Telephone Encounter (Signed)
New Message  Pt wife called states that the pt is coming down with Flu like symptoms and would like Dr. Stanford Breed to call in a script. Please assist.

## 2015-11-19 NOTE — Telephone Encounter (Signed)
Attempt x1 (no anwer or ring - may be related to phone line service issues.)

## 2015-11-19 NOTE — Telephone Encounter (Signed)
Wife requesting Rx for tamiflu, etc - she recently had bout of flu and thinks patient got it. Explained rationale for setting up PCP visit for this. Advised PCP follow up for cold/flu symptoms, caller voiced understanding.

## 2015-11-19 NOTE — Telephone Encounter (Signed)
F/u  Pt wife returning RNphone call- can try [phone] again or [phone]. Please call back and discuss.

## 2015-12-04 ENCOUNTER — Other Ambulatory Visit: Payer: Self-pay

## 2015-12-04 MED ORDER — PANTOPRAZOLE SODIUM 40 MG PO TBEC: 40.0000 mg | DELAYED_RELEASE_TABLET | Freq: Every day | ORAL | Status: AC

## 2015-12-24 ENCOUNTER — Other Ambulatory Visit (INDEPENDENT_AMBULATORY_CARE_PROVIDER_SITE_OTHER): Payer: Commercial Managed Care - HMO | Admitting: *Deleted

## 2015-12-24 DIAGNOSIS — E7800 Pure hypercholesterolemia, unspecified: Secondary | ICD-10-CM

## 2015-12-24 LAB — HEPATIC FUNCTION PANEL
ALBUMIN: 4.3 g/dL (ref 3.6–5.1)
ALT: 69 U/L — AB (ref 9–46)
AST: 32 U/L (ref 10–35)
Alkaline Phosphatase: 44 U/L (ref 40–115)
Bilirubin, Direct: 0.1 mg/dL (ref <=0.2)
Indirect Bilirubin: 0.3 mg/dL (ref 0.2–1.2)
TOTAL PROTEIN: 6.9 g/dL (ref 6.1–8.1)
Total Bilirubin: 0.4 mg/dL (ref 0.2–1.2)

## 2015-12-24 LAB — LIPID PANEL
Cholesterol: 170 mg/dL (ref 125–200)
HDL: 45 mg/dL (ref >=40)
LDL Cholesterol: 73 mg/dL (ref <130)
Total CHOL/HDL Ratio: 3.8 Ratio (ref <=5.0)
Triglycerides: 261 mg/dL — ABNORMAL HIGH (ref <150)
VLDL: 52 mg/dL — ABNORMAL HIGH (ref <30)

## 2016-01-12 ENCOUNTER — Other Ambulatory Visit: Payer: Self-pay

## 2016-01-12 MED ORDER — VALSARTAN 160 MG PO TABS: 160.0000 mg | ORAL_TABLET | Freq: Every day | ORAL | Status: DC

## 2016-02-16 ENCOUNTER — Telehealth: Payer: Self-pay | Admitting: Cardiology

## 2016-02-16 NOTE — Telephone Encounter (Signed)
No labs [name]

## 2016-02-16 NOTE — Telephone Encounter (Signed)
Spoke with pt, he is wanting to know if dr Stanford Breed wants any fasting lab work prior to his appointment. Will forward for dr Stanford Breed review

## 2016-02-16 NOTE — Telephone Encounter (Signed)
New message   Pt requested blood work but the order in the system for 2014. Please advise.

## 2016-02-17 NOTE — Telephone Encounter (Signed)
Spoke with pt, Aware of dr crenshaw's recommendations.  °

## 2016-03-07 ENCOUNTER — Other Ambulatory Visit: Payer: Self-pay | Admitting: *Deleted

## 2016-03-07 MED ORDER — CARVEDILOL 12.5 MG PO TABS: 12.5000 mg | ORAL_TABLET | Freq: Two times a day (BID) | ORAL | 2 refills | Status: DC

## 2016-03-08 ENCOUNTER — Telehealth: Payer: Self-pay | Admitting: Cardiology

## 2016-03-08 NOTE — Telephone Encounter (Signed)
°*  STAT* If patient is at the pharmacy, call can be transferred to refill team.   1. Which medications need to be refilled? (please list name of each medication and dose if known)METFORMAN 1000MG  2XDAY   2. Which pharmacy/location (including street and city if local pharmacy) is medication to be sent to? [facility] 3. Do they need a 30 day or 90 day supply? 90 DAYS        Pt verbalized that this is a medication that Dr.Brackbill had prescribed and kept filled for pt

## 2016-03-09 NOTE — Telephone Encounter (Signed)
Spoke with pt wife, aware will need to get metformin from his PCP.

## 2016-04-20 NOTE — Progress Notes (Signed)
HPI: FU CAD; history of acute lateral myocardial infarction 2004 followed by cardiac catheterization and PCI with bare-metal stents to the mid circumflex. Cardiac catheterization 11/15/2013 showed no obstructive disease in the LAD, 99% in-stent restenosis in the circumflex. There was a small obtuse marginal jailed by the stent with an ostial 90% lesion. There is a 60% mid RCA. Ejection fraction 65%. Intervention was completed using a Resolute Integrity DES. Since last seen, the patient has dyspnea with more extreme activities but not with routine activities. It is relieved with rest. It is not associated with chest pain. There is no orthopnea, PND or pedal edema. There is no syncope or palpitations. There is no exertional chest pain.   Current Outpatient Prescriptions  Medication Sig Dispense Refill  . aspirin 81 MG tablet Take 81 mg by mouth daily.      . carvedilol (COREG) 12.5 MG tablet Take 1 tablet (12.5 mg total) by mouth 2 (two) times daily with a meal. 180 tablet 2  . Evolocumab (REPATHA SURECLICK) [id number] MG/ML SOAJ Inject 1 pen into the skin every 14 (fourteen) days. 2 pen 11  . glimepiride (AMARYL) 1 MG tablet Take 1 tablet by mouth daily.    . metFORMIN (GLUCOPHAGE) 1000 MG tablet Take 1 tablet (1,000 mg total) by mouth 2 (two) times daily with a meal. 180 tablet 3  . nitroGLYCERIN (NITROSTAT) 0.4 MG SL tablet Place 0.4 mg under the tongue every 5 (five) minutes as needed for chest pain.    Marland Kitchen omega-3 acid ethyl esters (LOVAZA) 1 G capsule Take 2 capsules (2 g total) by mouth 2 (two) times daily. 360 capsule 1  . pantoprazole (PROTONIX) 40 MG tablet Take 1 tablet (40 mg total) by mouth daily. 90 tablet 3  . valsartan (DIOVAN) 160 MG tablet Take 1 tablet (160 mg total) by mouth daily. 90 tablet 1   No current facility-administered medications for this visit.      Past Medical History:  Diagnosis Date  . Complication of anesthesia    "I'm slow to come out"  . Coronary artery  disease 2015   PTCA with DES to Cx; PCI  BMS 2004  . GERD (gastroesophageal reflux disease)   . H/O hiatal hernia   . History of myocardial infarction    with a stent placed at that time  . Hypercholesterolemia    with intolerant ot many of the cholesterol medicaitons  . Hypertension    essentai hypertension  . Ischemic heart disease   . Myocardial infarction 2004   with a stent placed at that time  . Nephrosis  1960's  . Sleep apnea    "suppose to wear mask; I don't wear it enough" (11/15/2013)  . Type II diabetes mellitus ([place])     Past Surgical History:  Procedure Laterality Date  . BACK SURGERY    . COLONOSCOPY  10/07/2011   [name]:multiple colonic polyps removed/colonic diverticulosis, tubular adenoma  . COLONOSCOPY N/A 12/08/2014   Procedure: COLONOSCOPY;  Surgeon: [name], MD;  Location: [facility];  Service: Endoscopy;  Laterality: N/A;  930  . CORONARY ANGIOPLASTY WITH STENT PLACEMENT  09/25/2002   EF-approximately 45% / stent placement to the mid arteriovenous circumflex with reduction of 99% narrowing to 0% with placement of a 3.0 x 16 mm Express II stent with improvement of TIMI grade 2 to TIMI grade 3 flow.  . CORONARY ANGIOPLASTY WITH STENT PLACEMENT  11/15/2013   "1"  . LEFT HEART  CATHETERIZATION WITH CORONARY ANGIOGRAM N/A 11/15/2013   Procedure: LEFT HEART CATHETERIZATION WITH CORONARY ANGIOGRAM;  Surgeon: Burnell Blanks, MD;  Location: [facility] CATH LAB;  Service: Cardiovascular;  Laterality: N/A;  . LUMBAR [place]  . NASAL SEPTUM SURGERY  02/20/2001   Septal reconstruction and turbinate reduction    Social History   Social History  . Marital status: Married    Spouse name: N/A  . Number of children: N/A  . Years of education: N/A   Occupational History  . Not on file.   Social History Main Topics  . Smoking status: Never Smoker  . Smokeless tobacco: Never Used     Comment: Never smoker  . Alcohol use No  . Drug use: No  .  Sexual activity: Yes   Other Topics Concern  . Not on file   Social History Narrative  . No narrative on file    Family History  Problem Relation Age of Onset  . Coronary artery disease Father     with multiple angioplasties  . Hypertension      runs in the family  . Colon cancer Neg Hx   . Healthy Sister     ROS: no fevers or chills, productive cough, hemoptysis, dysphasia, odynophagia, melena, hematochezia, dysuria, hematuria, rash, seizure activity, orthopnea, PND, pedal edema, claudication. Remaining systems are negative.  Physical Exam: Well-developed well-nourished in no acute distress.  Skin is warm and dry.  HEENT is normal.  Neck is supple.  Chest is clear to auscultation with normal expansion.  Cardiovascular exam is regular rate and rhythm.  Abdominal exam nontender or distended. No masses palpated. Extremities show no edema. neuro grossly intact  ECG-sinus rhythm at a rate of 68. No ST changes.  A/P  1 coronary artery disease-continue aspirin and statin.   2 Hypertension-blood pressure controlled. Continue present medications.  3 hyperlipidemia-patient did not tolerate statins or Zetia previously. Now on repatha. Will continue. Check lipids and liver in 3 months.  [name], MD

## 2016-04-26 ENCOUNTER — Encounter: Payer: Self-pay | Admitting: Cardiology

## 2016-04-26 ENCOUNTER — Ambulatory Visit (INDEPENDENT_AMBULATORY_CARE_PROVIDER_SITE_OTHER): Payer: Commercial Managed Care - HMO | Admitting: Cardiology

## 2016-04-26 VITALS — BP 128/86 | HR 68 | Ht 70.0 in | Wt 170.8 lb

## 2016-04-26 DIAGNOSIS — E7849 Other hyperlipidemia: Secondary | ICD-10-CM

## 2016-04-26 DIAGNOSIS — E784 Other hyperlipidemia: Secondary | ICD-10-CM | POA: Diagnosis not present

## 2016-04-26 DIAGNOSIS — I10 Essential (primary) hypertension: Secondary | ICD-10-CM

## 2016-04-26 DIAGNOSIS — I2510 Atherosclerotic heart disease of native coronary artery without angina pectoris: Secondary | ICD-10-CM | POA: Diagnosis not present

## 2016-04-26 MED ORDER — NITROGLYCERIN 0.4 MG SL SUBL
0.4000 mg | SUBLINGUAL_TABLET | SUBLINGUAL | 3 refills | Status: DC | PRN
Start: 2016-04-26 — End: 2019-09-19

## 2016-04-26 NOTE — Patient Instructions (Signed)
Medication Instructions:   NO CHANGE  Labwork:  Your physician recommends that you return for lab work in: 3 MONTHS= DO NOT EAT PRIOR TO LAB WORK  Follow-Up:  Your physician wants you to follow-up in: [place] will receive a reminder letter in the mail two months in advance. If you don't receive a letter, please call our office to schedule the follow-up appointment.   If you need a refill on your cardiac medications before your next appointment, please call your pharmacy.

## 2016-07-14 ENCOUNTER — Other Ambulatory Visit: Payer: Self-pay

## 2016-07-14 MED ORDER — VALSARTAN 160 MG PO TABS: 160.0000 mg | ORAL_TABLET | Freq: Every day | ORAL | 1 refills | Status: DC

## 2016-07-27 ENCOUNTER — Encounter: Payer: Self-pay | Admitting: *Deleted

## 2016-08-09 LAB — HEPATIC FUNCTION PANEL
ALBUMIN: 4.5 g/dL (ref 3.6–5.1)
ALT: 59 U/L — AB (ref 9–46)
AST: 34 U/L (ref 10–35)
Alkaline Phosphatase: 38 U/L — ABNORMAL LOW (ref 40–115)
Bilirubin, Direct: 0.1 mg/dL (ref <=0.2)
Indirect Bilirubin: 0.4 mg/dL (ref 0.2–1.2)
Total Bilirubin: 0.5 mg/dL (ref 0.2–1.2)
Total Protein: 7.3 g/dL (ref 6.1–8.1)

## 2016-08-09 LAB — LIPID PANEL
CHOL/HDL RATIO: 3.8 ratio (ref <5.0)
CHOLESTEROL: 179 mg/dL (ref <200)
HDL: 47 mg/dL (ref >40)
LDL Cholesterol: 94 mg/dL (ref <100)
TRIGLYCERIDES: 188 mg/dL — AB (ref <150)
VLDL: 38 mg/dL — AB (ref <30)

## 2016-08-31 ENCOUNTER — Other Ambulatory Visit: Payer: Self-pay | Admitting: Pharmacist

## 2016-08-31 MED ORDER — EVOLOCUMAB 140 MG/ML ~~LOC~~ SOAJ: 1.0000 "pen " | SUBCUTANEOUS | 11 refills | Status: DC

## 2016-12-01 ENCOUNTER — Telehealth: Payer: Self-pay | Admitting: Internal Medicine

## 2016-12-01 NOTE — Telephone Encounter (Signed)
Walk In pt Form-Patient would like a call about medication. Placed in doc box.

## 2016-12-05 ENCOUNTER — Telehealth: Payer: Self-pay | Admitting: Pharmacist

## 2016-12-05 NOTE — Telephone Encounter (Signed)
Spoke with patient and he reports that he will be changing to part D in a few weeks. He is concerned with the cost of Repatha going up. He does not have plan details yet. Advised that company does now have PA for medicare, but we will need to obtain coverage through part D plan before we pursue PA. He will call once he has plan information so that we can submit for coverage. Pt states understanding and appreciation.

## 2016-12-07 ENCOUNTER — Other Ambulatory Visit: Payer: Self-pay

## 2016-12-07 MED ORDER — VALSARTAN 160 MG PO TABS: 160.0000 mg | ORAL_TABLET | Freq: Every day | ORAL | 1 refills | Status: DC

## 2017-02-17 ENCOUNTER — Telehealth: Payer: Self-pay | Admitting: Pharmacist

## 2017-02-17 MED ORDER — IRBESARTAN 150 MG PO TABS: 150.0000 mg | ORAL_TABLET | Freq: Every day | ORAL | 3 refills | Status: DC

## 2017-02-17 NOTE — Telephone Encounter (Signed)
Received notice from [facility] that pt was affected by valsartan recall. Will switch valsartan 160mg  daily to irbesartan 150mg  daily. Pt is aware and will monitor his BP over the next few weeks.

## 2017-02-28 DIAGNOSIS — R3912 Poor urinary stream: Secondary | ICD-10-CM | POA: Diagnosis not present

## 2017-02-28 DIAGNOSIS — R972 Elevated prostate specific antigen [PSA]: Secondary | ICD-10-CM | POA: Diagnosis not present

## 2017-03-07 DIAGNOSIS — R972 Elevated prostate specific antigen [PSA]: Secondary | ICD-10-CM | POA: Diagnosis not present

## 2017-03-13 ENCOUNTER — Other Ambulatory Visit: Payer: Self-pay | Admitting: *Deleted

## 2017-03-17 ENCOUNTER — Telehealth: Payer: Self-pay | Admitting: Pharmacist

## 2017-03-17 NOTE — Telephone Encounter (Signed)
Pt has changed to [employer] insurance - [employer].  ID: [id number] BIN: [id number] PCNEula Flax GRP: [id number]  Will submit new prior authorization and follow up with pt with copay information. May need to have pt fill out [employer] information if copay is cost prohibitive.

## 2017-03-20 ENCOUNTER — Telehealth: Payer: Self-pay | Admitting: Pharmacist

## 2017-03-20 MED ORDER — EVOLOCUMAB 140 MG/ML ~~LOC~~ SOAJ: 1.0000 "pen " | SUBCUTANEOUS | 11 refills | Status: DC

## 2017-03-20 NOTE — Telephone Encounter (Signed)
Repatha copay $375 per month. Discussed with pt, will pursue [employer] application. Pt will come to clinic on 8/29 to fill out paperwork.

## 2017-03-20 NOTE — Telephone Encounter (Signed)
Repatha PA was approved through 07/24/17. Will send rx to specialty pharmacy to see if copay is affordable.

## 2017-03-21 DIAGNOSIS — R3912 Poor urinary stream: Secondary | ICD-10-CM | POA: Diagnosis not present

## 2017-03-21 DIAGNOSIS — N4232 Atypical small acinar proliferation of prostate: Secondary | ICD-10-CM | POA: Diagnosis not present

## 2017-03-21 DIAGNOSIS — R972 Elevated prostate specific antigen [PSA]: Secondary | ICD-10-CM | POA: Diagnosis not present

## 2017-04-03 NOTE — Telephone Encounter (Signed)
Received fax from [employer] that pt needs to provide proof of income for his Repatha information. Called pt and asked him to send 2 most recent social security statements to [employer]. Pt verbalized understanding.

## 2017-05-10 NOTE — Telephone Encounter (Signed)
Pt called clinic to see if we had heard from the [employer]. Advised him we had not. Called SNF and waited on hold for an hour and 15 minutes to speak to someone. They stated they never received proof of income from pt. He submitted this 5 weeks ago. Called pt and advised him of this. He will resubmit proof of income.

## 2017-05-11 ENCOUNTER — Other Ambulatory Visit: Payer: Self-pay | Admitting: *Deleted

## 2017-05-11 MED ORDER — CARVEDILOL 12.5 MG PO TABS: 12.5000 mg | ORAL_TABLET | Freq: Two times a day (BID) | ORAL | 0 refills | Status: DC

## 2017-05-11 MED ORDER — OMEGA-3-ACID ETHYL ESTERS 1 G PO CAPS: 2.0000 g | ORAL_CAPSULE | Freq: Two times a day (BID) | ORAL | 0 refills | Status: DC

## 2017-06-09 NOTE — Telephone Encounter (Signed)
Pt called inquiring on Repatha. I most recently spoke with the [place] on 11/7 and was told that they have proof of pt income and that they are "processing" his application. Unsure hwy pt has not heard back yet, but wait time on the phone with SNF is > 60 minutes. Advised pt to wait another week or so and then to try contacting SNF if he has not heard from them yet.

## 2017-06-30 ENCOUNTER — Emergency Department (HOSPITAL_COMMUNITY): Payer: PPO

## 2017-06-30 ENCOUNTER — Telehealth: Payer: Self-pay | Admitting: Physician Assistant

## 2017-06-30 ENCOUNTER — Encounter: Payer: Self-pay | Admitting: Cardiology

## 2017-06-30 ENCOUNTER — Other Ambulatory Visit: Payer: Self-pay

## 2017-06-30 ENCOUNTER — Inpatient Hospital Stay (HOSPITAL_COMMUNITY)
Admission: EM | Admit: 2017-06-30 | Discharge: 2017-07-04 | DRG: 247 | Disposition: A | Discharge status: Home / Self Care | Payer: PPO | Attending: Internal Medicine | Admitting: Internal Medicine

## 2017-06-30 DIAGNOSIS — R0602 Shortness of breath: Secondary | ICD-10-CM | POA: Diagnosis not present

## 2017-06-30 DIAGNOSIS — I252 Old myocardial infarction: Secondary | ICD-10-CM

## 2017-06-30 DIAGNOSIS — E7800 Pure hypercholesterolemia, unspecified: Secondary | ICD-10-CM | POA: Diagnosis not present

## 2017-06-30 DIAGNOSIS — Z955 Presence of coronary angioplasty implant and graft: Secondary | ICD-10-CM

## 2017-06-30 DIAGNOSIS — Z8249 Family history of ischemic heart disease and other diseases of the circulatory system: Secondary | ICD-10-CM

## 2017-06-30 DIAGNOSIS — K219 Gastro-esophageal reflux disease without esophagitis: Secondary | ICD-10-CM | POA: Diagnosis present

## 2017-06-30 DIAGNOSIS — E119 Type 2 diabetes mellitus without complications: Secondary | ICD-10-CM | POA: Diagnosis not present

## 2017-06-30 DIAGNOSIS — R079 Chest pain, unspecified: Secondary | ICD-10-CM | POA: Diagnosis not present

## 2017-06-30 DIAGNOSIS — I10 Essential (primary) hypertension: Secondary | ICD-10-CM | POA: Diagnosis not present

## 2017-06-30 DIAGNOSIS — G4730 Sleep apnea, unspecified: Secondary | ICD-10-CM | POA: Diagnosis not present

## 2017-06-30 DIAGNOSIS — I200 Unstable angina: Secondary | ICD-10-CM | POA: Diagnosis present

## 2017-06-30 DIAGNOSIS — Z7982 Long term (current) use of aspirin: Secondary | ICD-10-CM | POA: Diagnosis not present

## 2017-06-30 DIAGNOSIS — I25110 Atherosclerotic heart disease of native coronary artery with unstable angina pectoris: Secondary | ICD-10-CM | POA: Diagnosis present

## 2017-06-30 DIAGNOSIS — Z888 Allergy status to other drugs, medicaments and biological substances status: Secondary | ICD-10-CM

## 2017-06-30 DIAGNOSIS — I214 Non-ST elevation (NSTEMI) myocardial infarction: Secondary | ICD-10-CM | POA: Diagnosis not present

## 2017-06-30 DIAGNOSIS — E785 Hyperlipidemia, unspecified: Secondary | ICD-10-CM | POA: Diagnosis not present

## 2017-06-30 DIAGNOSIS — Z7984 Long term (current) use of oral hypoglycemic drugs: Secondary | ICD-10-CM | POA: Diagnosis not present

## 2017-06-30 DIAGNOSIS — I2510 Atherosclerotic heart disease of native coronary artery without angina pectoris: Secondary | ICD-10-CM | POA: Diagnosis present

## 2017-06-30 LAB — BASIC METABOLIC PANEL
Anion gap: 10 (ref 5–15)
BUN: 20 mg/dL (ref 6–20)
CHLORIDE: 104 mmol/L (ref 101–111)
CO2: 22 mmol/L (ref 22–32)
CREATININE: 1.34 mg/dL — AB (ref 0.61–1.24)
Calcium: 9.3 mg/dL (ref 8.9–10.3)
GFR calc non Af Amer: 54 mL/min — ABNORMAL LOW (ref >60)
Glucose, Bld: 158 mg/dL — ABNORMAL HIGH (ref 65–99)
POTASSIUM: 5.4 mmol/L — AB (ref 3.5–5.1)
Sodium: 136 mmol/L (ref 135–145)

## 2017-06-30 LAB — CBC
HEMATOCRIT: 41.5 % (ref 39.0–52.0)
HEMOGLOBIN: 13.9 g/dL (ref 13.0–17.0)
MCH: 30 pg (ref 26.0–34.0)
MCHC: 33.5 g/dL (ref 30.0–36.0)
MCV: 89.4 fL (ref 78.0–100.0)
PLATELETS: 243 10*3/uL (ref 150–400)
RBC: 4.64 MIL/uL (ref 4.22–5.81)
RDW: 12.5 % (ref 11.5–15.5)
WBC: 7 10*3/uL (ref 4.0–10.5)

## 2017-06-30 LAB — I-STAT TROPONIN, ED: Troponin i, poc: 0 ng/mL (ref 0.00–0.08)

## 2017-06-30 MED ORDER — HEPARIN BOLUS VIA INFUSION
4000.0000 [IU] | Freq: Once | INTRAVENOUS | Status: AC
  Administered 2017-07-01: 4000 [IU] via INTRAVENOUS
  Filled 2017-06-30: qty 4000

## 2017-06-30 MED ORDER — HEPARIN (PORCINE) IN NACL 100-0.45 UNIT/ML-% IJ SOLN
1000.0000 [IU]/h | INTRAMUSCULAR | Status: DC
  Administered 2017-07-01 (×2): 1000 [IU]/h via INTRAVENOUS
  Filled 2017-06-30 (×3): qty 250

## 2017-06-30 MED ORDER — ASPIRIN 81 MG PO CHEW
324.0000 mg | CHEWABLE_TABLET | Freq: Once | ORAL | Status: AC
  Administered 2017-07-01: 324 mg via ORAL
  Filled 2017-06-30: qty 4

## 2017-06-30 NOTE — ED Triage Notes (Signed)
Pt reports sob for the past few weeks that has gotten worse, hx of MI, reports mild chest pain. Pt a/ox4, resp e/u, nad.

## 2017-06-30 NOTE — Progress Notes (Signed)
ANTICOAGULATION CONSULT NOTE - Initial Consult  Pharmacy Consult for Heparin  Indication: chest pain/ACS  Allergies  Allergen Reactions  . Beta Adrenergic Blockers Other (See Comments)    High doses make joints hurt.    Vital Signs: Temp: 98.6 F (37 C) (12/07 2002) Temp Source: Oral (12/07 2002) BP: 144/88 (12/07 2156) Pulse Rate: 73 (12/07 2156)  Labs: Recent Labs    06/30/17 1617  HGB 13.9  HCT 41.5  PLT 243  CREATININE 1.34*    CrCl cannot be calculated (Unknown ideal weight.).   Medical History: Past Medical History:  Diagnosis Date  . Complication of anesthesia    "I'm slow to come out"  . Coronary artery disease 2015   PTCA with DES to Cx; PCI  BMS 2004  . GERD (gastroesophageal reflux disease)   . H/O hiatal hernia   . History of myocardial infarction    with a stent placed at that time  . Hypercholesterolemia    with intolerant ot many of the cholesterol medicaitons  . Hypertension    essentai hypertension  . Ischemic heart disease   . Myocardial infarction [facility]) 2004   with a stent placed at that time  . Nephrosis  1960's  . Sleep apnea    "suppose to wear mask; I don't wear it enough" (11/15/2013)  . Type II diabetes mellitus [facility])     Assessment: 66 y/o M presents to the ED with chest pain, hx MI/CAD, starting heparin per pharmacy, CBC good, initial POC troponin negative, PTA meds reviewed.   Goal of Therapy:  Heparin level 0.3-0.7 units/ml Monitor platelets by anticoagulation protocol: Yes   Plan:  Heparin 4000 units BOLUS Start heparin drip at 1000 units/hr 0900 HL Daily CBC/HL Monitor for bleeding   Narda Bonds 06/30/2017,11:56 PM

## 2017-06-30 NOTE — Telephone Encounter (Signed)
Patient with known history of CAD.  Patient patient after our answering service complaining of worsening exertional shortness of breath and fatigue for the past several month.  This is his usual angina.  Symptoms consistent with class III angina.  His next office visit is on 07/13/2017, given the fact that his symptom is reminiscent of the previous anginal symptom.  Last cardiac catheterization was in 2015 with 99% in-stent restenosis of left circumflex artery.  I recommended the patient to seek more urgent medical attention at [facility] ED.  Hilbert Corrigan PA Pager: [phone]

## 2017-06-30 NOTE — ED Provider Notes (Signed)
TIME SEEN: 11:12 PM  CHIEF COMPLAINT: Worsening shortness of breath  HPI: Patient is a 66 year old male with history of hypertension, diabetes, hyperlipidemia, previous history of MI and 2 previous stents with his last cardiac catheterization approximately 4 years ago he sees Dr. Stanford Breed who presents emergency department with concerns for progressively worsening shortness of breath with exertion.  Does have intermittent chest discomfort but none currently.  He states he is also felt very fatigued and weak.  No diaphoresis, nausea, vomiting, dizziness.  No fever or cough.  No lower extremity swelling or pain.  States that this feels like how he felt when he had his previous anginal equivalent.  He states that he feels like he will need another cardiac catheterization.  States that now his symptoms come on with only walking several feet and now he is having symptoms with rest.  No chest pain currently but does feel short of breath.  His PCP is Dr. Gerarda Fraction.  ROS: See HPI Constitutional: no fever  Eyes: no drainage  ENT: no runny nose   Cardiovascular:   chest pain  Resp:  SOB  GI: no vomiting GU: no dysuria Integumentary: no rash  Allergy: no hives  Musculoskeletal: no leg swelling  Neurological: no slurred speech ROS otherwise negative  PAST MEDICAL HISTORY/PAST SURGICAL HISTORY:  Past Medical History:  Diagnosis Date  . Complication of anesthesia    "I'm slow to come out"  . Coronary artery disease 2015   PTCA with DES to Cx; PCI  BMS 2004  . GERD (gastroesophageal reflux disease)   . H/O hiatal hernia   . History of myocardial infarction    with a stent placed at that time  . Hypercholesterolemia    with intolerant ot many of the cholesterol medicaitons  . Hypertension    essentai hypertension  . Ischemic heart disease   . Myocardial infarction [facility]) 2004   with a stent placed at that time  . Nephrosis  1960's  . Sleep apnea    "suppose to wear mask; I don't wear it enough"  (11/15/2013)  . Type II diabetes mellitus ([facility])     MEDICATIONS:  Prior to Admission medications   Medication Sig Start Date End Date Taking? Authorizing Provider  aspirin 81 MG tablet Take 81 mg by mouth daily.      [provider]  carvedilol (COREG) 12.5 MG tablet Take 1 tablet (12.5 mg total) by mouth 2 (two) times daily with a meal. 05/11/17   Crenshaw, Denice Bors, MD  Evolocumab (REPATHA SURECLICK) 568 MG/ML SOAJ Inject 1 pen into the skin every 14 (fourteen) days. 03/20/17   Lelon Perla, MD  glimepiride (AMARYL) 1 MG tablet Take 1 tablet by mouth daily. 04/10/16   [provider]  irbesartan (AVAPRO) 150 MG tablet Take 1 tablet (150 mg total) by mouth daily. 02/17/17   Lelon Perla, MD  metFORMIN (GLUCOPHAGE) 1000 MG tablet Take 1 tablet (1,000 mg total) by mouth 2 (two) times daily with a meal. 02/02/15   [name], MD  nitroGLYCERIN (NITROSTAT) 0.4 MG SL tablet Place 1 tablet (0.4 mg total) under the tongue every 5 (five) minutes as needed for chest pain. 04/26/16   Lelon Perla, MD  omega-3 acid ethyl esters (LOVAZA) 1 g capsule Take 2 capsules (2 g total) by mouth 2 (two) times daily. NEED OV. 05/11/17   Lelon Perla, MD  pantoprazole (PROTONIX) 40 MG tablet Take 1 tablet (40 mg total) by mouth daily. 12/04/15  Lelon Perla, MD    ALLERGIES:  Allergies  Allergen Reactions  . Beta Adrenergic Blockers Other (See Comments)    High doses make joints hurt.    SOCIAL HISTORY:  Social History   Tobacco Use  . Smoking status: Never Smoker  . Smokeless tobacco: Never Used  . Tobacco comment: Never smoker  Substance Use Topics  . Alcohol use: No    Alcohol/week: 0.0 oz    FAMILY HISTORY: Family History  Problem Relation Age of Onset  . Coronary artery disease Father        with multiple angioplasties  . Healthy Sister   . Hypertension Unknown        runs in the family  . Colon cancer Neg Hx     EXAM: BP (!) 144/88 (BP  Location: Right Arm)   Pulse 73   Temp 98.6 F (37 C) (Oral)   Resp 19   SpO2 100%  CONSTITUTIONAL: Alert and oriented and responds appropriately to questions. Well-appearing; well-nourished HEAD: Normocephalic EYES: Conjunctivae clear, pupils appear equal, EOMI ENT: normal nose; moist mucous membranes NECK: Supple, no meningismus, no nuchal rigidity, no LAD  CARD: RRR; S1 and S2 appreciated; no murmurs, no clicks, no rubs, no gallops RESP: Normal chest excursion without splinting or tachypnea; breath sounds clear and equal bilaterally; no wheezes, no rhonchi, no rales, no hypoxia or respiratory distress, speaking full sentences ABD/GI: Normal bowel sounds; non-distended; soft, non-tender, no rebound, no guarding, no peritoneal signs, no hepatosplenomegaly BACK:  The back appears normal and is non-tender to palpation, there is no CVA tenderness EXT: Normal ROM in all joints; non-tender to palpation; no edema; normal capillary refill; no cyanosis, no calf tenderness or swelling    SKIN: Normal color for age and race; warm; no rash NEURO: Moves all extremities equally PSYCH: The patient's mood and manner are appropriate. Grooming and personal hygiene are appropriate.  MEDICAL DECISION MAKING: Patient here with concerning symptoms for unstable angina.  First troponin is negative.  Chest x-ray clear.  He is speaking full sentences with clear lungs and no hypoxia.  He has no risk factors except for age for PE.  Doubt dissection.  No pain at this time.  I feel he will need admission to the cardiology service.  Will discuss with cardiologist on call.  ED PROGRESS:    11:35 PM  D/w Dr. Emilio Aspen with cardiology who will see patient in the emergency department.  Will start heparin.   12:00 AM  Cardiology to admit.  Patient will likely have another cardiac catheterization at the beginning of next week.  I reviewed all nursing notes, vitals, pertinent previous records, EKGs, lab and urine results,  imaging (as available).    CRITICAL CARE Performed by: Pryor Curia   Total critical care time: 40 minutes  Critical care time was exclusive of separately billable procedures and treating other patients.  Critical care was necessary to treat or prevent imminent or life-threatening deterioration.  Critical care was time spent personally by me on the following activities: development of treatment plan with patient and/or surrogate as well as nursing, discussions with consultants, evaluation of patient's response to treatment, examination of patient, obtaining history from patient or surrogate, ordering and performing treatments and interventions, ordering and review of laboratory studies, ordering and review of radiographic studies, pulse oximetry and re-evaluation of patient's condition.     EKG Interpretation  Date/Time:  Friday June 30 2017 16:13:02 EST Ventricular Rate:  80 PR Interval:  182 QRS  Duration: 86 QT Interval:  366 QTC Calculation: 422 R Axis:   171 Text Interpretation:  Normal sinus rhythm Right axis deviation Abnormal ECG Confirmed by Lodema Parma, Cyril Mourning [phone]) on 06/30/2017 11:11:03 PM         Sharnetta Gielow, Delice Bison, DO 07/01/17 4715

## 2017-07-01 ENCOUNTER — Inpatient Hospital Stay (HOSPITAL_COMMUNITY): Payer: PPO

## 2017-07-01 ENCOUNTER — Other Ambulatory Visit: Payer: Self-pay

## 2017-07-01 DIAGNOSIS — R079 Chest pain, unspecified: Secondary | ICD-10-CM | POA: Diagnosis not present

## 2017-07-01 DIAGNOSIS — Z955 Presence of coronary angioplasty implant and graft: Secondary | ICD-10-CM | POA: Diagnosis not present

## 2017-07-01 DIAGNOSIS — E119 Type 2 diabetes mellitus without complications: Secondary | ICD-10-CM | POA: Diagnosis present

## 2017-07-01 DIAGNOSIS — I214 Non-ST elevation (NSTEMI) myocardial infarction: Secondary | ICD-10-CM | POA: Diagnosis not present

## 2017-07-01 DIAGNOSIS — Z8249 Family history of ischemic heart disease and other diseases of the circulatory system: Secondary | ICD-10-CM | POA: Diagnosis not present

## 2017-07-01 DIAGNOSIS — I25110 Atherosclerotic heart disease of native coronary artery with unstable angina pectoris: Secondary | ICD-10-CM | POA: Diagnosis present

## 2017-07-01 DIAGNOSIS — I200 Unstable angina: Secondary | ICD-10-CM

## 2017-07-01 DIAGNOSIS — I10 Essential (primary) hypertension: Secondary | ICD-10-CM | POA: Diagnosis present

## 2017-07-01 DIAGNOSIS — Z7982 Long term (current) use of aspirin: Secondary | ICD-10-CM | POA: Diagnosis not present

## 2017-07-01 DIAGNOSIS — E7800 Pure hypercholesterolemia, unspecified: Secondary | ICD-10-CM | POA: Diagnosis present

## 2017-07-01 DIAGNOSIS — E785 Hyperlipidemia, unspecified: Secondary | ICD-10-CM | POA: Diagnosis present

## 2017-07-01 DIAGNOSIS — Z7984 Long term (current) use of oral hypoglycemic drugs: Secondary | ICD-10-CM | POA: Diagnosis not present

## 2017-07-01 DIAGNOSIS — I252 Old myocardial infarction: Secondary | ICD-10-CM | POA: Diagnosis not present

## 2017-07-01 DIAGNOSIS — Z888 Allergy status to other drugs, medicaments and biological substances status: Secondary | ICD-10-CM | POA: Diagnosis not present

## 2017-07-01 DIAGNOSIS — G4730 Sleep apnea, unspecified: Secondary | ICD-10-CM | POA: Diagnosis present

## 2017-07-01 DIAGNOSIS — K219 Gastro-esophageal reflux disease without esophagitis: Secondary | ICD-10-CM | POA: Diagnosis present

## 2017-07-01 LAB — HEPARIN LEVEL (UNFRACTIONATED)
HEPARIN UNFRACTIONATED: 0.59 [IU]/mL (ref 0.30–0.70)
HEPARIN UNFRACTIONATED: 0.54 [IU]/mL (ref 0.30–0.70)

## 2017-07-01 LAB — BASIC METABOLIC PANEL
ANION GAP: 10 (ref 5–15)
BUN: 18 mg/dL (ref 6–20)
CO2: 25 mmol/L (ref 22–32)
Calcium: 9 mg/dL (ref 8.9–10.3)
Chloride: 100 mmol/L — ABNORMAL LOW (ref 101–111)
Creatinine, Ser: 1.2 mg/dL (ref 0.61–1.24)
GLUCOSE: 166 mg/dL — AB (ref 65–99)
POTASSIUM: 3.7 mmol/L (ref 3.5–5.1)
Sodium: 135 mmol/L (ref 135–145)

## 2017-07-01 LAB — LIPID PANEL
CHOLESTEROL: 251 mg/dL — AB (ref 0–200)
HDL: 39 mg/dL — ABNORMAL LOW (ref >40)
LDL Cholesterol: 187 mg/dL — ABNORMAL HIGH (ref 0–99)
TRIGLYCERIDES: 126 mg/dL (ref <150)
Total CHOL/HDL Ratio: 6.4 RATIO
VLDL: 25 mg/dL (ref 0–40)

## 2017-07-01 LAB — CBC
HEMATOCRIT: 40.6 % (ref 39.0–52.0)
HEMOGLOBIN: 13.7 g/dL (ref 13.0–17.0)
MCH: 30.3 pg (ref 26.0–34.0)
MCHC: 33.7 g/dL (ref 30.0–36.0)
MCV: 89.8 fL (ref 78.0–100.0)
Platelets: 238 10*3/uL (ref 150–400)
RBC: 4.52 MIL/uL (ref 4.22–5.81)
RDW: 12.6 % (ref 11.5–15.5)
WBC: 8.1 10*3/uL (ref 4.0–10.5)

## 2017-07-01 LAB — HEMOGLOBIN A1C
HEMOGLOBIN A1C: 7.3 % — AB (ref 4.8–5.6)
MEAN PLASMA GLUCOSE: 162.81 mg/dL

## 2017-07-01 LAB — ECHOCARDIOGRAM COMPLETE
Ao-asc: 30 cm
CHL CUP DOP CALC LVOT VTI: 19.5 cm
E decel time: 180 msec
EERAT: 8.85
FS: 32 % (ref 28–44)
HEIGHTINCHES: 70 in
IVS/LV PW RATIO, ED: 0.88
LA ID, A-P, ES: 37 mm
LA vol A4C: 32.2 ml
LA vol index: 19.2 mL/m2
LADIAMINDEX: 1.92 cm/m2
LAVOL: 37 mL
LEFT ATRIUM END SYS DIAM: 37 mm
LV E/e'average: 8.85
LV PW d: 9.73 mm — AB (ref 0.6–1.1)
LV e' LATERAL: 8.44 cm/s
LVEEMED: 8.85
LVOT area: 3.8 cm2
LVOT peak vel: 90.2 cm/s
LVOTD: 22 mm
LVOTSV: 74 mL
MV Dec: 180
MV Peak grad: 2 mmHg
MVPKAVEL: 63.1 m/s
MVPKEVEL: 74.7 m/s
RV LATERAL S' VELOCITY: 8.05 cm/s
RV TAPSE: 16.5 mm
TDI e' lateral: 8.44
TDI e' medial: 6.19
WEIGHTICAEL: 2673.6 [oz_av]

## 2017-07-01 LAB — GLUCOSE, CAPILLARY
GLUCOSE-CAPILLARY: 155 mg/dL — AB (ref 65–99)
Glucose-Capillary: 96 mg/dL (ref 65–99)
Glucose-Capillary: 158 mg/dL — ABNORMAL HIGH (ref 65–99)
Glucose-Capillary: 176 mg/dL — ABNORMAL HIGH (ref 65–99)

## 2017-07-01 LAB — I-STAT TROPONIN, ED: Troponin i, poc: 0 ng/mL (ref 0.00–0.08)

## 2017-07-01 LAB — HIV ANTIBODY (ROUTINE TESTING W REFLEX): HIV SCREEN 4TH GENERATION: NONREACTIVE

## 2017-07-01 LAB — MRSA PCR SCREENING: MRSA BY PCR: NEGATIVE

## 2017-07-01 LAB — CBG MONITORING, ED: Glucose-Capillary: 122 mg/dL — ABNORMAL HIGH (ref 65–99)

## 2017-07-01 MED ORDER — INSULIN ASPART 100 UNIT/ML ~~LOC~~ SOLN
0.0000 [IU] | Freq: Three times a day (TID) | SUBCUTANEOUS | Status: DC
  Administered 2017-07-01 (×2): 3 [IU] via SUBCUTANEOUS
  Administered 2017-07-02: 2 [IU] via SUBCUTANEOUS
  Administered 2017-07-02: 3 [IU] via SUBCUTANEOUS
  Administered 2017-07-03: 2 [IU] via SUBCUTANEOUS
  Administered 2017-07-03: 3 [IU] via SUBCUTANEOUS
  Administered 2017-07-04: 2 [IU] via SUBCUTANEOUS

## 2017-07-01 MED ORDER — PANTOPRAZOLE SODIUM 40 MG PO TBEC
40.0000 mg | DELAYED_RELEASE_TABLET | Freq: Every day | ORAL | Status: DC
  Administered 2017-07-01 – 2017-07-04 (×4): 40 mg via ORAL
  Filled 2017-07-01 (×4): qty 1

## 2017-07-01 MED ORDER — CARVEDILOL 12.5 MG PO TABS
12.5000 mg | ORAL_TABLET | Freq: Two times a day (BID) | ORAL | Status: DC
  Administered 2017-07-01 – 2017-07-04 (×7): 12.5 mg via ORAL
  Filled 2017-07-01 (×7): qty 1

## 2017-07-01 MED ORDER — ASPIRIN EC 81 MG PO TBEC
81.0000 mg | DELAYED_RELEASE_TABLET | Freq: Every day | ORAL | Status: DC
  Administered 2017-07-02 – 2017-07-04 (×2): 81 mg via ORAL
  Filled 2017-07-01 (×3): qty 1

## 2017-07-01 MED ORDER — INSULIN ASPART 100 UNIT/ML ~~LOC~~ SOLN
4.0000 [IU] | Freq: Three times a day (TID) | SUBCUTANEOUS | Status: DC
  Administered 2017-07-01 – 2017-07-04 (×7): 4 [IU] via SUBCUTANEOUS

## 2017-07-01 MED ORDER — OMEGA-3-ACID ETHYL ESTERS 1 G PO CAPS
2.0000 g | ORAL_CAPSULE | Freq: Two times a day (BID) | ORAL | Status: DC
  Administered 2017-07-01 – 2017-07-04 (×8): 2 g via ORAL
  Filled 2017-07-01 (×8): qty 2

## 2017-07-01 MED ORDER — IRBESARTAN 150 MG PO TABS
150.0000 mg | ORAL_TABLET | Freq: Every day | ORAL | Status: DC
  Administered 2017-07-01 – 2017-07-04 (×4): 150 mg via ORAL
  Filled 2017-07-01 (×4): qty 1

## 2017-07-01 MED ORDER — ACETAMINOPHEN 325 MG PO TABS
650.0000 mg | ORAL_TABLET | ORAL | Status: DC | PRN
  Filled 2017-07-01: qty 2

## 2017-07-01 MED ORDER — ROSUVASTATIN CALCIUM 10 MG PO TABS
5.0000 mg | ORAL_TABLET | Freq: Every day | ORAL | Status: DC
  Administered 2017-07-01 – 2017-07-02 (×3): 5 mg via ORAL
  Filled 2017-07-01 (×3): qty 1

## 2017-07-01 MED ORDER — ONDANSETRON HCL 4 MG/2ML IJ SOLN: 4.0000 mg | Freq: Four times a day (QID) | INTRAMUSCULAR | Status: DC | PRN

## 2017-07-01 MED ORDER — INSULIN ASPART 100 UNIT/ML ~~LOC~~ SOLN: 0.0000 [IU] | Freq: Every day | SUBCUTANEOUS | Status: DC

## 2017-07-01 MED ORDER — NITROGLYCERIN 0.4 MG SL SUBL
0.4000 mg | SUBLINGUAL_TABLET | SUBLINGUAL | Status: DC | PRN
Start: 2017-07-01 — End: 2017-07-04

## 2017-07-01 NOTE — Progress Notes (Addendum)
[place] for Heparin  Indication: chest pain/ACS  Allergies  Allergen Reactions  . Beta Adrenergic Blockers Other (See Comments)    High doses make joints hurt.    Vital Signs: Temp: 98.3 F (36.8 C) (12/08 1103) Temp Source: Oral (12/08 1103) BP: 131/80 (12/08 1103) Pulse Rate: 73 (12/08 1103)  Labs: Recent Labs    06/30/17 1617 07/01/17 0300 07/01/17 0837  HGB 13.9 13.7  --   HCT 41.5 40.6  --   PLT 243 238  --   HEPARINUNFRC  --   --  0.59  CREATININE 1.34* 1.20  --     Estimated Creatinine Clearance: 63.4 mL/min (by C-G formula based on SCr of 1.2 mg/dL).   Medical History: Past Medical History:  Diagnosis Date  . Complication of anesthesia    "I'm slow to come out"  . Coronary artery disease 2015   PTCA with DES to Cx; PCI  BMS 2004  . GERD (gastroesophageal reflux disease)   . H/O hiatal hernia   . History of myocardial infarction    with a stent placed at that time  . Hypercholesterolemia    with intolerant ot many of the cholesterol medicaitons  . Hypertension    essentai hypertension  . Ischemic heart disease   . Myocardial infarction [facility]) 2004   with a stent placed at that time  . Nephrosis  1960's  . Sleep apnea    "suppose to wear mask; I don't wear it enough" (11/15/2013)  . Type II diabetes mellitus [facility])     Assessment: 66 y/o M presents to the ED with chest pain, hx MI/CAD, starting heparin per pharmacy, CBC good, initial POC troponin negative, PTA meds reviewed.  Initial heparin level within goal range.  No overt bleeding or complications noted.  CBC stable. Awaiting cath on Monday.  Goal of Therapy:  Heparin level 0.3-0.7 units/ml Monitor platelets by anticoagulation protocol: Yes   Plan:  Continue IV heparin at current rate. Confirm heparin level now. Daily heparin level and CBC.  Uvaldo Rising, BCPS  Clinical Pharmacist Pager [phone]  07/01/2017 2:25 PM   Addendum:  repeat heparin level this afternoon remains at goal.  Uvaldo Rising, BCPS  Clinical Pharmacist Pager [phone]  07/01/2017 3:28 PM

## 2017-07-01 NOTE — H&P (Signed)
CARDIOLOGY H&P  HPI: Eric Ball is a 66 y.o. male w/ a history of HTN, HLD, DM, and multivessel CAD s/p multiple prior stents presenting with exertional shortness of breath and fatigue. Symptoms have been ongoing for several weeks now, though have gotten worse over the past several days. Endorses occasional chest pain/pressure, nonradiating and lasting several minutes at a time. Patient has been avoiding strenuous activity as he fears he will have recurrence of the severe chest pain that he has had in the past with his MI's. Has been taking all medications regularly. No syncope, presycnope, orthopnea, edema. Not using nitroglycerin.   Has been off Repatha for several months now as his insurance stopped paying for it. Hasn't taken a statin in the interim.   In the ED, patient endorses labored breathing while laying down at rest. No chest pain. Initial troponin negative. ECG without ischemic changes. Given aspirin, started on heparin gtt for unstable angina.   Review of Systems:     Cardiac Review of Systems: {Y] = yes [ ]  = no  Chest Pain [  Y  ]  Resting SOB [  Y ] Exertional SOB  [ Y ]  Orthopnea [  ]   Pedal Edema [   ]    Palpitations [  ] Syncope  [  ]   Presyncope [   ]  General Review of Systems: [Y] = yes [  ]=no Constitional: recent weight change [  ]; anorexia [  ]; fatigue [ Y ]; nausea [  ]; night sweats [  ]; fever [  ]; or chills [  ];                                                                     Dental: poor dentition[  ];   Eye : blurred vision [  ]; diplopia [   ]; vision changes [  ];  Amaurosis fugax[  ]; Resp: cough [  ];  wheezing[  ];  hemoptysis[  ]; shortness of breath[  ]; paroxysmal nocturnal dyspnea[  ]; dyspnea on exertion[  ]; or orthopnea[  ];  GI:  gallstones[  ], vomiting[  ];  dysphagia[  ]; melena[  ];  hematochezia [  ]; heartburn[  ];   GU: kidney stones [  ]; hematuria[  ];   dysuria [  ];  nocturia[  ];               Skin: rash [  ], swelling[   ];, hair loss[  ];  peripheral edema[  ];  or itching[  ]; Musculosketetal: myalgias[  ];  joint swelling[  ];  joint erythema[  ];  joint pain[  ];  back pain[  ];  Heme/Lymph: bruising[  ];  bleeding[  ];  anemia[  ];  Neuro: TIA[  ];  headaches[  ];  stroke[  ];  vertigo[  ];  seizures[  ];   paresthesias[  ];  difficulty walking[  ];  Psych:depression[  ]; anxiety[  ];  Endocrine: diabetes[  ];  thyroid dysfunction[  ];  Other:  Past Medical History:  Diagnosis Date  . Complication of anesthesia    "I'm slow to come out"  .  Coronary artery disease 2015   PTCA with DES to Cx; PCI  BMS 2004  . GERD (gastroesophageal reflux disease)   . H/O hiatal hernia   . History of myocardial infarction    with a stent placed at that time  . Hypercholesterolemia    with intolerant ot many of the cholesterol medicaitons  . Hypertension    essentai hypertension  . Ischemic heart disease   . Myocardial infarction [facility]) 2004   with a stent placed at that time  . Nephrosis  1960's  . Sleep apnea    "suppose to wear mask; I don't wear it enough" (11/15/2013)  . Type II diabetes mellitus ([place])     Prior to Admission medications   Medication Sig Start Date End Date Taking? Authorizing Provider  aspirin 81 MG tablet Take 81 mg by mouth daily.      [provider]  carvedilol (COREG) 12.5 MG tablet Take 1 tablet (12.5 mg total) by mouth 2 (two) times daily with a meal. 05/11/17   Crenshaw, Denice Bors, MD  Evolocumab (REPATHA SURECLICK) 045 MG/ML SOAJ Inject 1 pen into the skin every 14 (fourteen) days. 03/20/17   Lelon Perla, MD  glimepiride (AMARYL) 1 MG tablet Take 1 tablet by mouth daily. 04/10/16   [provider]  irbesartan (AVAPRO) 150 MG tablet Take 1 tablet (150 mg total) by mouth daily. 02/17/17   Lelon Perla, MD  metFORMIN (GLUCOPHAGE) 1000 MG tablet Take 1 tablet (1,000 mg total) by mouth 2 (two) times daily with a meal. 02/02/15   [name], MD    nitroGLYCERIN (NITROSTAT) 0.4 MG SL tablet Place 1 tablet (0.4 mg total) under the tongue every 5 (five) minutes as needed for chest pain. 04/26/16   Lelon Perla, MD  omega-3 acid ethyl esters (LOVAZA) 1 g capsule Take 2 capsules (2 g total) by mouth 2 (two) times daily. NEED OV. 05/11/17   Lelon Perla, MD  pantoprazole (PROTONIX) 40 MG tablet Take 1 tablet (40 mg total) by mouth daily. 12/04/15   Lelon Perla, MD     Allergies  Allergen Reactions  . Beta Adrenergic Blockers Other (See Comments)    High doses make joints hurt.    Social History   Socioeconomic History  . Marital status: Married    Spouse name: Not on file  . Number of children: Not on file  . Years of education: Not on file  . Highest education level: Not on file  Social Needs  . Financial resource strain: Not on file  . Food insecurity - worry: Not on file  . Food insecurity - inability: Not on file  . Transportation needs - medical: Not on file  . Transportation needs - non-medical: Not on file  Occupational History  . Not on file  Tobacco Use  . Smoking status: Never Smoker  . Smokeless tobacco: Never Used  . Tobacco comment: Never smoker  Substance and Sexual Activity  . Alcohol use: No    Alcohol/week: 0.0 oz  . Drug use: No  . Sexual activity: Yes  Other Topics Concern  . Not on file  Social History Narrative  . Not on file    Family History  Problem Relation Age of Onset  . Coronary artery disease Father        with multiple angioplasties  . Healthy Sister   . Hypertension Unknown        runs in the family  . Colon cancer  Neg Hx     PHYSICAL EXAM: Vitals:   06/30/17 2002 06/30/17 2156  BP: 135/86 (!) 144/88  Pulse: 75 73  Resp: 14 19  Temp: 98.6 F (37 C)   SpO2: 98% 100%   General:  Well appearing. No respiratory distress HEENT: normal Neck: supple. no JVD. Carotids 2+ bilat; no bruits. No lymphadenopathy or thryomegaly appreciated. Cor: PMI nondisplaced.  Regular rate & rhythm. No rubs, gallops or murmurs. Lungs: clear Abdomen: soft, nontender, nondistended. No hepatosplenomegaly. No bruits or masses. Good bowel sounds. Extremities: no cyanosis, clubbing, rash, edema Neuro: alert & oriented x 3, cranial nerves grossly intact. moves all 4 extremities w/o difficulty. Affect pleasant.  ECG: NSR, borderline right axis deviation, no ST or T wave changes suggestive of ischemia, unchanged when compared to prior ECG  Results for orders placed or performed during the hospital encounter of 06/30/17 (from the past 24 hour(s))  Basic metabolic panel     Status: Abnormal   Collection Time: 06/30/17  4:17 PM  Result Value Ref Range   Sodium 136 135 - 145 mmol/L   Potassium 5.4 (H) 3.5 - 5.1 mmol/L   Chloride 104 101 - 111 mmol/L   CO2 22 22 - 32 mmol/L   Glucose, Bld 158 (H) 65 - 99 mg/dL   BUN 20 6 - 20 mg/dL   Creatinine, Ser 1.34 (H) 0.61 - 1.24 mg/dL   Calcium 9.3 8.9 - 10.3 mg/dL   GFR calc non Af Amer 54 (L) >60 mL/min   GFR calc Af Amer >60 >60 mL/min   Anion gap 10 5 - 15  CBC     Status: None   Collection Time: 06/30/17  4:17 PM  Result Value Ref Range   WBC 7.0 4.0 - 10.5 K/uL   RBC 4.64 4.22 - 5.81 MIL/uL   Hemoglobin 13.9 13.0 - 17.0 g/dL   HCT 41.5 39.0 - 52.0 %   MCV 89.4 78.0 - 100.0 fL   MCH 30.0 26.0 - 34.0 pg   MCHC 33.5 30.0 - 36.0 g/dL   RDW 12.5 11.5 - 15.5 %   Platelets 243 150 - 400 K/uL  I-stat troponin, ED     Status: None   Collection Time: 06/30/17  4:28 PM  Result Value Ref Range   Troponin i, poc 0.00 0.00 - 0.08 ng/mL   Comment 3           Dg Chest 2 View  Result Date: 06/30/2017 CLINICAL DATA:  Shortness of breath. EXAM: CHEST  2 VIEW COMPARISON:  Chest x-ray report 09/25/2002. FINDINGS: Mediastinum hilar structures are normal. Lungs are clear. Heart is pulmonary vascularity noted. Mild left base subsegmental atelectasis. No pleural effusion or pneumothorax. Mild elevation left hemidiaphragm. Degenerative  changes thoracic spine. IMPRESSION: Mild left base subsegmental atelectasis. Mild elevation left hemidiaphragm. Electronically Signed   By: Marcello Moores  Register   On: 06/30/2017 17:03    ASSESSMENT: JAHRED TATAR is a 66 y.o. male w/ a history of HTN, HLD, DM, and multivessel CAD s/p multiple prior stents presenting with exertional shortness of breath and fatigue, suggestive of unstable (crescendo) angina.   PLAN/DISCUSSION: #) NSTEMI - repeat troponin q6h x 2 - TTE - check lipids, A1c - ASA 324mg  then 81mg  daily - heparin drip for ACS per pharmacy protocol - start rosuvastatin 5mg  QD (patient agrees to try this but concerned about history of "joint aches" with statins) - cont home ARB - SLN, nitro gtt PRN - plan for cath Monday -  defer P2Y12 until after cath  #) DM  - hold home glimepiride and metformin while in hospital - fingerstick BG's AC and HS - sliding scale insulin for now - would be a good candidate for an SGLT-2 inhibitor on discharge instead of glimepiride  Marcie Mowers, MD Cardiology Fellow, PGY-5

## 2017-07-01 NOTE — Progress Notes (Signed)
  Echocardiogram 2D Echocardiogram has been performed.  Eric Ball 07/01/2017, 9:52 AM

## 2017-07-01 NOTE — Progress Notes (Signed)
Progress Note  Patient Name: Eric Ball Date of Encounter: 07/01/2017  Primary Cardiologist: Dr. Stanford Breed  Subjective   Chest heaviness remains present but mild.  No severe shortness of breath.  Inpatient Medications    Scheduled Meds: . [START ON 07/02/2017] aspirin EC  81 mg Oral Daily  . carvedilol  12.5 mg Oral BID WC  . insulin aspart  0-15 Units Subcutaneous TID WC  . insulin aspart  0-5 Units Subcutaneous QHS  . insulin aspart  4 Units Subcutaneous TID WC  . irbesartan  150 mg Oral Daily  . omega-3 acid ethyl esters  2 g Oral BID  . pantoprazole  40 mg Oral Daily  . rosuvastatin  5 mg Oral q1800   Continuous Infusions: . heparin 1,000 Units/hr (07/01/17 0056)   PRN Meds: acetaminophen, nitroGLYCERIN, ondansetron (ZOFRAN) IV   Vital Signs    Vitals:   07/01/17 0349 07/01/17 0352 07/01/17 0817 07/01/17 1103  BP:  130/82  131/80  Pulse:  62  73  Resp:  16 16 16   Temp:  97.8 F (36.6 C) 98.2 F (36.8 C) 98.3 F (36.8 C)  TempSrc:  Oral Oral Oral  SpO2:  96% 98% 98%  Weight: 167 lb 1.6 oz (75.8 kg)     Height:        Intake/Output Summary (Last 24 hours) at 07/01/2017 1128 Last data filed at 07/01/2017 0900 Gross per 24 hour  Intake 260.67 ml  Output -  Net 260.67 ml   Filed Weights   07/01/17 0255 07/01/17 0349  Weight: 170 lb (77.1 kg) 167 lb 1.6 oz (75.8 kg)    Telemetry    Sinus rhythm- Personally Reviewed  ECG    Normal sinus rhythm- Personally Reviewed  Physical Exam   GEN: No acute distress.   Neck:  7 cm JVD Cardiac: RRR, no murmurs, rubs, or gallops.  Respiratory: Clear to auscultation bilaterally. GI: Soft, nontender, non-distended  MS: No edema; No deformity. Neuro:  Nonfocal  Psych: Normal affect   Labs    Chemistry Recent Labs  Lab 06/30/17 1617 07/01/17 0300  NA 136 135  K 5.4* 3.7  CL 104 100*  CO2 22 25  GLUCOSE 158* 166*  BUN 20 18  CREATININE 1.34* 1.20  CALCIUM 9.3 9.0  GFRNONAA 54* >60  GFRAA >60  >60  ANIONGAP 10 10     Hematology Recent Labs  Lab 06/30/17 1617 07/01/17 0300  WBC 7.0 8.1  RBC 4.64 4.52  HGB 13.9 13.7  HCT 41.5 40.6  MCV 89.4 89.8  MCH 30.0 30.3  MCHC 33.5 33.7  RDW 12.5 12.6  PLT 243 238    Cardiac EnzymesNo results for input(s): TROPONINI in the last 168 hours.  Recent Labs  Lab 06/30/17 1628 07/01/17 0148  TROPIPOC 0.00 0.00     BNPNo results for input(s): BNP, PROBNP in the last 168 hours.   DDimer No results for input(s): DDIMER in the last 168 hours.   Radiology    Dg Chest 2 View  Result Date: 06/30/2017 CLINICAL DATA:  Shortness of breath. EXAM: CHEST  2 VIEW COMPARISON:  Chest x-ray report 09/25/2002. FINDINGS: Mediastinum hilar structures are normal. Lungs are clear. Heart is pulmonary vascularity noted. Mild left base subsegmental atelectasis. No pleural effusion or pneumothorax. Mild elevation left hemidiaphragm. Degenerative changes thoracic spine. IMPRESSION: Mild left base subsegmental atelectasis. Mild elevation left hemidiaphragm. Electronically Signed   By: Marcello Moores  Register   On: 06/30/2017 17:03    Cardiac  Studies   None  Patient Profile     66 y.o. male admitted with shortness of breath and chest heaviness, similar to his symptoms prior to his coronary stenting.  Assessment & Plan    1.  Unstable angina -his symptoms are somewhat atypical, but are very similar to his prior presentations resulting in multiple stents.  We will plan to proceed with cardiac catheterization on Monday. 2.  Coronary artery disease -he is status post multiple coronary stents.  He will continue his current medical therapy for now, with heart catheterization planned. 3.  Dyspnea -his symptoms are improved.  Will use additional diuretic therapy as needed.  For questions or updates, please contact [place] Please consult [email or web address] for contact info under Cardiology/STEMI.      Signed, Cristopher Peru, MD  07/01/2017, 11:28 AM  Patient ID:  Eric Ball, male   DOB: [birth year], 65 y.o.   MRN: [id number]

## 2017-07-02 LAB — CBC
HEMATOCRIT: 39.3 % (ref 39.0–52.0)
HEMOGLOBIN: 13.4 g/dL (ref 13.0–17.0)
MCH: 30.2 pg (ref 26.0–34.0)
MCHC: 34.1 g/dL (ref 30.0–36.0)
MCV: 88.7 fL (ref 78.0–100.0)
Platelets: 209 10*3/uL (ref 150–400)
RBC: 4.43 MIL/uL (ref 4.22–5.81)
RDW: 12.4 % (ref 11.5–15.5)
WBC: 6 10*3/uL (ref 4.0–10.5)

## 2017-07-02 LAB — GLUCOSE, CAPILLARY
GLUCOSE-CAPILLARY: 150 mg/dL — AB (ref 65–99)
GLUCOSE-CAPILLARY: 187 mg/dL — AB (ref 65–99)
Glucose-Capillary: 135 mg/dL — ABNORMAL HIGH (ref 65–99)

## 2017-07-02 LAB — HEPARIN LEVEL (UNFRACTIONATED): Heparin Unfractionated: 0.42 IU/mL (ref 0.30–0.70)

## 2017-07-02 MED ORDER — ASPIRIN 81 MG PO CHEW
81.0000 mg | CHEWABLE_TABLET | ORAL | Status: AC
  Administered 2017-07-03: 81 mg via ORAL
  Filled 2017-07-02: qty 1

## 2017-07-02 MED ORDER — SODIUM CHLORIDE 0.9% FLUSH: 3.0000 mL | INTRAVENOUS | Status: DC | PRN

## 2017-07-02 MED ORDER — SODIUM CHLORIDE 0.9 % IV SOLN: 250.0000 mL | INTRAVENOUS | Status: DC | PRN

## 2017-07-02 MED ORDER — SODIUM CHLORIDE 0.9 % WEIGHT BASED INFUSION
1.0000 mL/kg/h | INTRAVENOUS | Status: DC
  Administered 2017-07-02: 1 mL/kg/h via INTRAVENOUS

## 2017-07-02 MED ORDER — SODIUM CHLORIDE 0.9% FLUSH: 3.0000 mL | Freq: Two times a day (BID) | INTRAVENOUS | Status: DC

## 2017-07-02 NOTE — Progress Notes (Signed)
Progress Note  Patient Name: ALMA MUEGGE Date of Encounter: 07/02/2017  Primary Cardiologist: Dr. Stanford Breed  Subjective   No additional chest heaviness, and no dyspnea while at rest.  Anxious to try to figure out what is making him feel poorly  Inpatient Medications    Scheduled Meds: . aspirin EC  81 mg Oral Daily  . carvedilol  12.5 mg Oral BID WC  . insulin aspart  0-15 Units Subcutaneous TID WC  . insulin aspart  0-5 Units Subcutaneous QHS  . insulin aspart  4 Units Subcutaneous TID WC  . irbesartan  150 mg Oral Daily  . omega-3 acid ethyl esters  2 g Oral BID  . pantoprazole  40 mg Oral Daily  . rosuvastatin  5 mg Oral q1800   Continuous Infusions: . heparin 1,000 Units/hr (07/02/17 0834)   PRN Meds: acetaminophen, nitroGLYCERIN, ondansetron (ZOFRAN) IV   Vital Signs    Vitals:   07/01/17 1941 07/01/17 2322 07/02/17 0321 07/02/17 0834  BP: 112/77 110/71 108/72 140/83  Pulse: 71 67 70   Resp: 20 18 20    Temp: 98.5 F (36.9 C) 98.5 F (36.9 C) 98 F (36.7 C) 97.8 F (36.6 C)  TempSrc: Oral Oral Oral Oral  SpO2: 96% 96% 97%   Weight:      Height:        Intake/Output Summary (Last 24 hours) at 07/02/2017 0941 Last data filed at 07/02/2017 0834 Gross per 24 hour  Intake 505.67 ml  Output -  Net 505.67 ml   Filed Weights   07/01/17 0255 07/01/17 0349  Weight: 170 lb (77.1 kg) 167 lb 1.6 oz (75.8 kg)    Telemetry    Normal sinus rhythm- Personally Reviewed  ECG    Normal sinus rhythm- Personally Reviewed  Physical Exam   GEN: No acute distress.   Neck:  6 cm JVD Cardiac: RRR, no murmurs, rubs, or gallops.  Respiratory: Clear to auscultation bilaterally. GI: Soft, nontender, non-distended  MS: No edema; No deformity. Neuro:  Nonfocal  Psych: Normal affect   Labs    Chemistry Recent Labs  Lab 06/30/17 1617 07/01/17 0300  NA 136 135  K 5.4* 3.7  CL 104 100*  CO2 22 25  GLUCOSE 158* 166*  BUN 20 18  CREATININE 1.34* 1.20    CALCIUM 9.3 9.0  GFRNONAA 54* >60  GFRAA >60 >60  ANIONGAP 10 10     Hematology Recent Labs  Lab 06/30/17 1617 07/01/17 0300 07/02/17 0847  WBC 7.0 8.1 6.0  RBC 4.64 4.52 4.43  HGB 13.9 13.7 13.4  HCT 41.5 40.6 39.3  MCV 89.4 89.8 88.7  MCH 30.0 30.3 30.2  MCHC 33.5 33.7 34.1  RDW 12.5 12.6 12.4  PLT 243 238 209    Cardiac EnzymesNo results for input(s): TROPONINI in the last 168 hours.  Recent Labs  Lab 06/30/17 1628 07/01/17 0148  TROPIPOC 0.00 0.00     BNPNo results for input(s): BNP, PROBNP in the last 168 hours.   DDimer No results for input(s): DDIMER in the last 168 hours.   Radiology    Dg Chest 2 View  Result Date: 06/30/2017 CLINICAL DATA:  Shortness of breath. EXAM: CHEST  2 VIEW COMPARISON:  Chest x-ray report 09/25/2002. FINDINGS: Mediastinum hilar structures are normal. Lungs are clear. Heart is pulmonary vascularity noted. Mild left base subsegmental atelectasis. No pleural effusion or pneumothorax. Mild elevation left hemidiaphragm. Degenerative changes thoracic spine. IMPRESSION: Mild left base subsegmental atelectasis. Mild elevation  left hemidiaphragm. Electronically Signed   By: Marcello Moores  Register   On: 06/30/2017 17:03    Cardiac Studies   None  Patient Profile     66 y.o. male admitted with symptoms typical for his prior acute coronary syndrome presentations.  He is now pain-free with no more shortness of breath.  Assessment & Plan    1.  Unstable angina -he is pain-free.  His chest heaviness has resolved.  We will plan to proceed with left heart catheterization tomorrow.  Additional recommendations will be based on the results of the heart cath.  Because of dyspnea, we will plan a right heart cath as well. 2.  Coronary artery disease -he is status post stenting.  He will continue his current medical therapy 3.  Dyspnea -his shortness of breath has resolved with bedrest.  We will plan to obtain right heart catheterization.   For questions  or updates, please contact [place] Please consult [email or web address] for contact info under Cardiology/STEMI.      Signed, Cristopher Peru, MD  07/02/2017, 9:41 AM  Patient ID: [name] Comment, male   DOB: [birth year], 66 y.o.   MRN: [id number]

## 2017-07-02 NOTE — Progress Notes (Signed)
[place] for Heparin  Indication: chest pain/ACS  Allergies  Allergen Reactions  . Beta Adrenergic Blockers Other (See Comments)    High doses make joints hurt.    Vital Signs: Temp: 97.8 F (36.6 C) (12/09 0834) Temp Source: Oral (12/09 0834) BP: 140/83 (12/09 0834) Pulse Rate: 70 (12/09 0321)  Labs: Recent Labs    06/30/17 1617 07/01/17 0300 07/01/17 0837 07/01/17 1430 07/02/17 0847  HGB 13.9 13.7  --   --  13.4  HCT 41.5 40.6  --   --  39.3  PLT 243 238  --   --  209  HEPARINUNFRC  --   --  0.59 0.54 0.42  CREATININE 1.34* 1.20  --   --   --     Estimated Creatinine Clearance: 63.4 mL/min (by C-G formula based on SCr of 1.2 mg/dL).   Medical History: Past Medical History:  Diagnosis Date  . Complication of anesthesia    "I'm slow to come out"  . Coronary artery disease 2015   PTCA with DES to Cx; PCI  BMS 2004  . GERD (gastroesophageal reflux disease)   . H/O hiatal hernia   . History of myocardial infarction    with a stent placed at that time  . Hypercholesterolemia    with intolerant ot many of the cholesterol medicaitons  . Hypertension    essentai hypertension  . Ischemic heart disease   . Myocardial infarction [facility]) 2004   with a stent placed at that time  . Nephrosis  1960's  . Sleep apnea    "suppose to wear mask; I don't wear it enough" (11/15/2013)  . Type II diabetes mellitus [facility])     Assessment: 66 y/o M presents to the ED with chest pain, hx MI/CAD, starting heparin per pharmacy, CBC good, initial POC troponin negative, PTA meds reviewed.  Heparin level remains within goal range.  No overt bleeding or complications noted.  CBC stable. Awaiting cath on Monday.  Goal of Therapy:  Heparin level 0.3-0.7 units/ml Monitor platelets by anticoagulation protocol: Yes   Plan:  Continue IV heparin at current rate. Daily heparin level and CBC.  Uvaldo Rising, BCPS  Clinical Pharmacist Pager  [phone]  07/02/2017 2:34 PM

## 2017-07-02 NOTE — H&P (View-Only) (Signed)
Progress Note  Patient Name: Eric Ball Date of Encounter: 07/02/2017  Primary Cardiologist: Dr. Stanford Breed  Subjective   No additional chest heaviness, and no dyspnea while at rest.  Anxious to try to figure out what is making him feel poorly  Inpatient Medications    Scheduled Meds: . aspirin EC  81 mg Oral Daily  . carvedilol  12.5 mg Oral BID WC  . insulin aspart  0-15 Units Subcutaneous TID WC  . insulin aspart  0-5 Units Subcutaneous QHS  . insulin aspart  4 Units Subcutaneous TID WC  . irbesartan  150 mg Oral Daily  . omega-3 acid ethyl esters  2 g Oral BID  . pantoprazole  40 mg Oral Daily  . rosuvastatin  5 mg Oral q1800   Continuous Infusions: . heparin 1,000 Units/hr (07/02/17 0834)   PRN Meds: acetaminophen, nitroGLYCERIN, ondansetron (ZOFRAN) IV   Vital Signs    Vitals:   07/01/17 1941 07/01/17 2322 07/02/17 0321 07/02/17 0834  BP: 112/77 110/71 108/72 140/83  Pulse: 71 67 70   Resp: 20 18 20    Temp: 98.5 F (36.9 C) 98.5 F (36.9 C) 98 F (36.7 C) 97.8 F (36.6 C)  TempSrc: Oral Oral Oral Oral  SpO2: 96% 96% 97%   Weight:      Height:        Intake/Output Summary (Last 24 hours) at 07/02/2017 0941 Last data filed at 07/02/2017 0834 Gross per 24 hour  Intake 505.67 ml  Output -  Net 505.67 ml   Filed Weights   07/01/17 0255 07/01/17 0349  Weight: 170 lb (77.1 kg) 167 lb 1.6 oz (75.8 kg)    Telemetry    Normal sinus rhythm- Personally Reviewed  ECG    Normal sinus rhythm- Personally Reviewed  Physical Exam   GEN: No acute distress.   Neck:  6 cm JVD Cardiac: RRR, no murmurs, rubs, or gallops.  Respiratory: Clear to auscultation bilaterally. GI: Soft, nontender, non-distended  MS: No edema; No deformity. Neuro:  Nonfocal  Psych: Normal affect   Labs    Chemistry Recent Labs  Lab 06/30/17 1617 07/01/17 0300  NA 136 135  K 5.4* 3.7  CL 104 100*  CO2 22 25  GLUCOSE 158* 166*  BUN 20 18  CREATININE 1.34* 1.20    CALCIUM 9.3 9.0  GFRNONAA 54* >60  GFRAA >60 >60  ANIONGAP 10 10     Hematology Recent Labs  Lab 06/30/17 1617 07/01/17 0300 07/02/17 0847  WBC 7.0 8.1 6.0  RBC 4.64 4.52 4.43  HGB 13.9 13.7 13.4  HCT 41.5 40.6 39.3  MCV 89.4 89.8 88.7  MCH 30.0 30.3 30.2  MCHC 33.5 33.7 34.1  RDW 12.5 12.6 12.4  PLT 243 238 209    Cardiac EnzymesNo results for input(s): TROPONINI in the last 168 hours.  Recent Labs  Lab 06/30/17 1628 07/01/17 0148  TROPIPOC 0.00 0.00     BNPNo results for input(s): BNP, PROBNP in the last 168 hours.   DDimer No results for input(s): DDIMER in the last 168 hours.   Radiology    Dg Chest 2 View  Result Date: 06/30/2017 CLINICAL DATA:  Shortness of breath. EXAM: CHEST  2 VIEW COMPARISON:  Chest x-ray report 09/25/2002. FINDINGS: Mediastinum hilar structures are normal. Lungs are clear. Heart is pulmonary vascularity noted. Mild left base subsegmental atelectasis. No pleural effusion or pneumothorax. Mild elevation left hemidiaphragm. Degenerative changes thoracic spine. IMPRESSION: Mild left base subsegmental atelectasis. Mild elevation  left hemidiaphragm. Electronically Signed   By: Marcello Moores  Register   On: 06/30/2017 17:03    Cardiac Studies   None  Patient Profile     66 y.o. male admitted with symptoms typical for his prior acute coronary syndrome presentations.  He is now pain-free with no more shortness of breath.  Assessment & Plan    1.  Unstable angina -he is pain-free.  His chest heaviness has resolved.  We will plan to proceed with left heart catheterization tomorrow.  Additional recommendations will be based on the results of the heart cath.  Because of dyspnea, we will plan a right heart cath as well. 2.  Coronary artery disease -he is status post stenting.  He will continue his current medical therapy 3.  Dyspnea -his shortness of breath has resolved with bedrest.  We will plan to obtain right heart catheterization.   For questions  or updates, please contact [place] Please consult [email or web address] for contact info under Cardiology/STEMI.      Signed, Cristopher Peru, MD  07/02/2017, 9:41 AM  Patient ID: Eric Ball, male   DOB: 07-Sep-1950, 66 y.o.   MRN: [id number]

## 2017-07-03 ENCOUNTER — Encounter (HOSPITAL_COMMUNITY)
Admission: EM | Disposition: A | Discharge status: Home / Self Care | Payer: Self-pay | Source: Home / Self Care | Attending: Internal Medicine

## 2017-07-03 ENCOUNTER — Encounter (HOSPITAL_COMMUNITY): Payer: Self-pay | Admitting: Internal Medicine

## 2017-07-03 DIAGNOSIS — I25110 Atherosclerotic heart disease of native coronary artery with unstable angina pectoris: Principal | ICD-10-CM

## 2017-07-03 DIAGNOSIS — E785 Hyperlipidemia, unspecified: Secondary | ICD-10-CM

## 2017-07-03 HISTORY — PX: CORONARY STENT INTERVENTION: CATH118234

## 2017-07-03 HISTORY — PX: RIGHT/LEFT HEART CATH AND CORONARY ANGIOGRAPHY: CATH118266

## 2017-07-03 LAB — BASIC METABOLIC PANEL
ANION GAP: 9 (ref 5–15)
BUN: 12 mg/dL (ref 6–20)
CO2: 23 mmol/L (ref 22–32)
Calcium: 8.6 mg/dL — ABNORMAL LOW (ref 8.9–10.3)
Chloride: 103 mmol/L (ref 101–111)
Creatinine, Ser: 1.09 mg/dL (ref 0.61–1.24)
GLUCOSE: 157 mg/dL — AB (ref 65–99)
Potassium: 4.2 mmol/L (ref 3.5–5.1)
SODIUM: 135 mmol/L (ref 135–145)

## 2017-07-03 LAB — CBC
HEMATOCRIT: 39.3 % (ref 39.0–52.0)
HEMOGLOBIN: 13.2 g/dL (ref 13.0–17.0)
MCH: 30.1 pg (ref 26.0–34.0)
MCHC: 33.6 g/dL (ref 30.0–36.0)
MCV: 89.7 fL (ref 78.0–100.0)
Platelets: 229 10*3/uL (ref 150–400)
RBC: 4.38 MIL/uL (ref 4.22–5.81)
RDW: 12.6 % (ref 11.5–15.5)
WBC: 6.8 10*3/uL (ref 4.0–10.5)

## 2017-07-03 LAB — GLUCOSE, CAPILLARY
GLUCOSE-CAPILLARY: 159 mg/dL — AB (ref 65–99)
Glucose-Capillary: 157 mg/dL — ABNORMAL HIGH (ref 65–99)
Glucose-Capillary: 143 mg/dL — ABNORMAL HIGH (ref 65–99)
Glucose-Capillary: 164 mg/dL — ABNORMAL HIGH (ref 65–99)
Glucose-Capillary: 152 mg/dL — ABNORMAL HIGH (ref 65–99)

## 2017-07-03 LAB — PROTIME-INR
INR: 1
Prothrombin Time: 13.1 seconds (ref 11.4–15.2)

## 2017-07-03 LAB — HEPARIN LEVEL (UNFRACTIONATED): HEPARIN UNFRACTIONATED: 0.57 [IU]/mL (ref 0.30–0.70)

## 2017-07-03 SURGERY — RIGHT/LEFT HEART CATH AND CORONARY ANGIOGRAPHY
Anesthesia: LOCAL

## 2017-07-03 MED ORDER — MIDAZOLAM HCL 2 MG/2ML IJ SOLN
INTRAMUSCULAR | Status: AC
  Filled 2017-07-03: qty 2

## 2017-07-03 MED ORDER — VERAPAMIL HCL 2.5 MG/ML IV SOLN
INTRAVENOUS | Status: DC | PRN
  Administered 2017-07-03: 10 mL via INTRA_ARTERIAL

## 2017-07-03 MED ORDER — SODIUM CHLORIDE 0.9 % IV SOLN: 250.0000 mL | INTRAVENOUS | Status: DC | PRN

## 2017-07-03 MED ORDER — HEPARIN SODIUM (PORCINE) 1000 UNIT/ML IJ SOLN
INTRAMUSCULAR | Status: AC
  Filled 2017-07-03: qty 1

## 2017-07-03 MED ORDER — ROSUVASTATIN CALCIUM 10 MG PO TABS
10.0000 mg | ORAL_TABLET | Freq: Every day | ORAL | Status: DC
  Administered 2017-07-03: 10 mg via ORAL
  Filled 2017-07-03: qty 1

## 2017-07-03 MED ORDER — HEPARIN (PORCINE) IN NACL 2-0.9 UNIT/ML-% IJ SOLN
INTRAMUSCULAR | Status: AC
  Filled 2017-07-03: qty 1000

## 2017-07-03 MED ORDER — SODIUM CHLORIDE 0.9% FLUSH: 3.0000 mL | INTRAVENOUS | Status: DC | PRN

## 2017-07-03 MED ORDER — SODIUM CHLORIDE 0.9 % IV SOLN: INTRAVENOUS | Status: AC

## 2017-07-03 MED ORDER — TICAGRELOR 90 MG PO TABS
ORAL_TABLET | ORAL | Status: DC | PRN
  Administered 2017-07-03: 180 mg via ORAL

## 2017-07-03 MED ORDER — IOPAMIDOL (ISOVUE-370) INJECTION 76%
INTRAVENOUS | Status: AC
  Filled 2017-07-03: qty 100

## 2017-07-03 MED ORDER — HEPARIN (PORCINE) IN NACL 2-0.9 UNIT/ML-% IJ SOLN
INTRAMUSCULAR | Status: AC | PRN
  Administered 2017-07-03: 1000 mL

## 2017-07-03 MED ORDER — LIDOCAINE HCL (PF) 1 % IJ SOLN
INTRAMUSCULAR | Status: AC
  Filled 2017-07-03: qty 30

## 2017-07-03 MED ORDER — EZETIMIBE 10 MG PO TABS
10.0000 mg | ORAL_TABLET | Freq: Every day | ORAL | Status: DC
  Administered 2017-07-03 – 2017-07-04 (×3): 10 mg via ORAL
  Filled 2017-07-03 (×2): qty 1

## 2017-07-03 MED ORDER — FENTANYL CITRATE (PF) 100 MCG/2ML IJ SOLN
INTRAMUSCULAR | Status: DC | PRN
  Administered 2017-07-03: 50 ug via INTRAVENOUS

## 2017-07-03 MED ORDER — FENTANYL CITRATE (PF) 100 MCG/2ML IJ SOLN
INTRAMUSCULAR | Status: AC
  Filled 2017-07-03: qty 2

## 2017-07-03 MED ORDER — ENOXAPARIN SODIUM 40 MG/0.4ML ~~LOC~~ SOLN
40.0000 mg | SUBCUTANEOUS | Status: DC
  Administered 2017-07-04: 40 mg via SUBCUTANEOUS
  Filled 2017-07-03: qty 0.4

## 2017-07-03 MED ORDER — MIDAZOLAM HCL 2 MG/2ML IJ SOLN
INTRAMUSCULAR | Status: DC | PRN
  Administered 2017-07-03: 1 mg via INTRAVENOUS

## 2017-07-03 MED ORDER — IOPAMIDOL (ISOVUE-370) INJECTION 76%
INTRAVENOUS | Status: AC
  Filled 2017-07-03: qty 50

## 2017-07-03 MED ORDER — NITROGLYCERIN 1 MG/10 ML FOR IR/CATH LAB
INTRA_ARTERIAL | Status: AC
  Filled 2017-07-03: qty 10

## 2017-07-03 MED ORDER — HEPARIN SODIUM (PORCINE) 1000 UNIT/ML IJ SOLN
INTRAMUSCULAR | Status: DC | PRN
  Administered 2017-07-03 (×2): 4000 [IU] via INTRAVENOUS
  Administered 2017-07-03: 3000 [IU] via INTRAVENOUS

## 2017-07-03 MED ORDER — TICAGRELOR 90 MG PO TABS
90.0000 mg | ORAL_TABLET | Freq: Two times a day (BID) | ORAL | Status: DC
  Administered 2017-07-03 – 2017-07-04 (×2): 90 mg via ORAL
  Filled 2017-07-03 (×2): qty 1

## 2017-07-03 MED ORDER — TICAGRELOR 90 MG PO TABS
ORAL_TABLET | ORAL | Status: AC
  Filled 2017-07-03: qty 2

## 2017-07-03 MED ORDER — TAMSULOSIN HCL 0.4 MG PO CAPS
0.4000 mg | ORAL_CAPSULE | Freq: Every day | ORAL | Status: DC
  Administered 2017-07-03 – 2017-07-04 (×3): 0.4 mg via ORAL
  Filled 2017-07-03 (×2): qty 1

## 2017-07-03 MED ORDER — NITROGLYCERIN 1 MG/10 ML FOR IR/CATH LAB
INTRA_ARTERIAL | Status: DC | PRN
  Administered 2017-07-03 (×2): 200 ug via INTRACORONARY

## 2017-07-03 MED ORDER — HYDRALAZINE HCL 20 MG/ML IJ SOLN: 5.0000 mg | INTRAMUSCULAR | Status: AC | PRN

## 2017-07-03 MED ORDER — VERAPAMIL HCL 2.5 MG/ML IV SOLN
INTRAVENOUS | Status: AC
  Filled 2017-07-03: qty 2

## 2017-07-03 MED ORDER — LIDOCAINE HCL (PF) 1 % IJ SOLN
INTRAMUSCULAR | Status: DC | PRN
  Administered 2017-07-03: 5 mL

## 2017-07-03 MED ORDER — SODIUM CHLORIDE 0.9% FLUSH
3.0000 mL | Freq: Two times a day (BID) | INTRAVENOUS | Status: DC
  Administered 2017-07-03 – 2017-07-04 (×3): 3 mL via INTRAVENOUS

## 2017-07-03 SURGICAL SUPPLY — 26 items
BALLN EMERGE MR 2.5X8 (BALLOONS) ×2
BALLN ~~LOC~~ EUPHORA RX 3.25X8 (BALLOONS) ×2
BALLOON EMERGE MR 2.5X8 (BALLOONS) IMPLANT
BALLOON ~~LOC~~ EUPHORA RX 3.25X8 (BALLOONS) IMPLANT
CATH BALLN WEDGE 5F 110CM (CATHETERS) ×1 IMPLANT
CATH INFINITI 5 FR JL3.5 (CATHETERS) ×1 IMPLANT
CATH INFINITI 5FR ANG PIGTAIL (CATHETERS) ×1 IMPLANT
CATH LAUNCHER 6FR AL.75 (CATHETERS) ×1 IMPLANT
CATH OPTITORQUE TIG 4.0 5F (CATHETERS) ×1 IMPLANT
COVER PRB 48X5XTLSCP FOLD TPE (BAG) IMPLANT
COVER PROBE 5X48 (BAG) ×2
DEVICE RAD COMP TR BAND LRG (VASCULAR PRODUCTS) ×1 IMPLANT
GLIDESHEATH SLEND SS 6F .021 (SHEATH) ×1 IMPLANT
GUIDEWIRE .025 260CM (WIRE) ×1 IMPLANT
GUIDEWIRE INQWIRE 1.5J.035X260 (WIRE) IMPLANT
INQWIRE 1.5J .035X260CM (WIRE) ×4
KIT ENCORE 26 ADVANTAGE (KITS) ×1 IMPLANT
KIT HEART LEFT (KITS) ×2 IMPLANT
PACK CARDIAC CATHETERIZATION (CUSTOM PROCEDURE TRAY) ×2 IMPLANT
SHEATH GLIDE SLENDER 4/5FR (SHEATH) ×1 IMPLANT
STENT RESOLUTE ONYX 3.0X12 (Permanent Stent) ×1 IMPLANT
SYR MEDRAD MARK V 150ML (SYRINGE) ×2 IMPLANT
TRANSDUCER W/STOPCOCK (MISCELLANEOUS) ×2 IMPLANT
TUBING CIL FLEX 10 FLL-RA (TUBING) ×2 IMPLANT
WIRE ASAHI PROWATER 180CM (WIRE) ×1 IMPLANT
WIRE RUNTHROUGH .014X180CM (WIRE) ×1 IMPLANT

## 2017-07-03 NOTE — Progress Notes (Signed)
Progress Note  Patient Name: Eric Ball Date of Encounter: 07/03/2017  Primary Cardiologist: Stanford Breed  Subjective   No recurrent chest pain; back from cath/PCI today  Inpatient Medications    Scheduled Meds: . aspirin EC  81 mg Oral Daily  . carvedilol  12.5 mg Oral BID WC  . [START ON 07/04/2017] enoxaparin (LOVENOX) injection  40 mg Subcutaneous Q24H  . insulin aspart  0-15 Units Subcutaneous TID WC  . insulin aspart  0-5 Units Subcutaneous QHS  . insulin aspart  4 Units Subcutaneous TID WC  . irbesartan  150 mg Oral Daily  . omega-3 acid ethyl esters  2 g Oral BID  . pantoprazole  40 mg Oral Daily  . rosuvastatin  5 mg Oral q1800  . sodium chloride flush  3 mL Intravenous Q12H  . tamsulosin  0.4 mg Oral Daily  . ticagrelor  90 mg Oral BID   Continuous Infusions: . sodium chloride 750 mL (07/03/17 1109)  . sodium chloride     PRN Meds: sodium chloride, acetaminophen, hydrALAZINE, nitroGLYCERIN, ondansetron (ZOFRAN) IV, sodium chloride flush   Vital Signs    Vitals:   07/03/17 1230 07/03/17 1245 07/03/17 1300 07/03/17 1330  BP: 134/85 (!) 144/82 (!) 148/92 (!) 135/91  Pulse: 65 69 71 72  Resp: 12 14 16  (!) 21  Temp:      TempSrc:      SpO2: 99% 99% 99% 98%  Weight:      Height:        Intake/Output Summary (Last 24 hours) at 07/03/2017 1504 Last data filed at 07/03/2017 1341 Gross per 24 hour  Intake 1169.71 ml  Output 150 ml  Net 1019.71 ml    I/O since admission: +1786  Filed Weights   07/01/17 0255 07/01/17 0349 07/03/17 0530  Weight: 170 lb (77.1 kg) 167 lb 1.6 oz (75.8 kg) 168 lb 10.4 oz (76.5 kg)    Telemetry    Sinus - Personally Reviewed  ECG    ECG (independently read by me): NSR at 62  Physical Exam   BP (!) 135/91   Pulse 72   Temp (!) 97.5 F (36.4 C) (Oral)   Resp (!) 21   Ht 5\' 10"  (1.778 m)   Wt 168 lb 10.4 oz (76.5 kg)   SpO2 98%   BMI 24.20 kg/m  General: Alert, oriented, no distress.  Skin: normal turgor,  no rashes, warm and dry HEENT: Normocephalic, atraumatic. Pupils equal round and reactive to light; sclera anicteric; extraocular muscles intact;  Nose without nasal septal hypertrophy Mouth/Parynx benign; Mallinpatti scale 3 Neck: No JVD, no carotid bruits; normal carotid upstroke Lungs: clear to ausculatation and percussion; no wheezing or rales Chest wall: without tenderness to palpitation Heart: PMI not displaced, RRR, s1 s2 normal, 1/6 systolic murmur, no diastolic murmur, no rubs, gallops, thrills, or heaves Abdomen: soft, nontender; no hepatosplenomehaly, BS+; abdominal aorta nontender and not dilated by palpation. Back: no CVA tenderness Pulses 2+ Musculoskeletal: full range of motion, normal strength, no joint deformities Extremities: no clubbing cyanosis or edema, Homan's sign negative  Neurologic: grossly nonfocal; Cranial nerves grossly wnl Psychologic: Normal mood and affect   Labs    Chemistry Recent Labs  Lab 06/30/17 1617 07/01/17 0300 07/03/17 0024  NA 136 135 135  K 5.4* 3.7 4.2  CL 104 100* 103  CO2 22 25 23   GLUCOSE 158* 166* 157*  BUN 20 18 12   CREATININE 1.34* 1.20 1.09  CALCIUM 9.3 9.0 8.6*  GFRNONAA 54* >60 >60  GFRAA >60 >60 >60  ANIONGAP 10 10 9      Hematology Recent Labs  Lab 07/01/17 0300 07/02/17 0847 07/03/17 0024  WBC 8.1 6.0 6.8  RBC 4.52 4.43 4.38  HGB 13.7 13.4 13.2  HCT 40.6 39.3 39.3  MCV 89.8 88.7 89.7  MCH 30.3 30.2 30.1  MCHC 33.7 34.1 33.6  RDW 12.6 12.4 12.6  PLT 238 209 229    Cardiac EnzymesNo results for input(s): TROPONINI in the last 168 hours.  Recent Labs  Lab 06/30/17 1628 07/01/17 0148  TROPIPOC 0.00 0.00     BNPNo results for input(s): BNP, PROBNP in the last 168 hours.   DDimer No results for input(s): DDIMER in the last 168 hours.   Lipid Panel     Component Value Date/Time   CHOL 251 (H) 07/01/2017 0133   TRIG 126 07/01/2017 0133   HDL 39 (L) 07/01/2017 0133   CHOLHDL 6.4 07/01/2017 0133    VLDL 25 07/01/2017 0133   LDLCALC 187 (H) 07/01/2017 0133   LDLDIRECT 146.0 12/19/2014 0827    Radiology    No results found.  Cardiac Studies    07/03/2017 Procedures   CORONARY STENT INTERVENTION  RIGHT/LEFT HEART CATH AND CORONARY ANGIOGRAPHY   Conclusions: 1. Mild to moderate, non-obstructive coronary artery disease involving the LAD, LCx, and proximal RCA. 2. Hazy, eccentric 70-80% stenosis involving the mid RCA. 3. Patent stent in the proximal/mid LCx. 4. Normal left ventricular contraction with upper normal to mildly elevated left and right heart filling pressures. 5. Low normal Fick cardiac output/index. 6. Successful PCI to mid RCA with placement of a Resolute Onyx 3.0 x 12 mm drug-eluting stent with 0% residual stenosis and TIMI-3 flow.  Recommendations: 1. Dual antiplatelet therapy with aspirin and ticagrelor for at least 12 months. 2. Aggressive secondary prevention.            Post-Intervention Diagram          Patient Profile     66 y.o. male admitted with symptoms typical for his prior acute coronary syndrome presentations.   Assessment & Plan    1. Unstable Angina: cath data as above; s/p PCI/DES stent to mid RCA with mild mod concomitant CAD in LAD, LCx, and proximal RCA.   2. DM on insulin  3. Hyperlipidemia:  LDL 187;  Will increase rosuvastatin (he apparently cannot tolerate high dose), but will try to increase to 10 mg and add zetia, with plan to [place] now that there has been a significant price drop and medicare co-pay payment will be significantly lower.  LDL target < 70.   Plan dc tomorrow.  Signed, Troy Sine, MD, [facility] 07/03/2017, 3:04 PM

## 2017-07-03 NOTE — Brief Op Note (Signed)
BRIEF CARDIAC CATHETERIZATION NOTE  DATE: 07/03/2017 TIME: 10:38 AM  PATIENT:  Eric Ball  66 y.o. male  PRE-OPERATIVE DIAGNOSIS:  unstable angina  POST-OPERATIVE DIAGNOSIS:  same  PROCEDURE:  Procedure(s): RIGHT/LEFT HEART CATH AND CORONARY ANGIOGRAPHY (N/A)  SURGEON:  Surgeon(s) and Role:    * Mischell Branford, MD - Primary  FINDINGS: 1. Mild to moderate, diffuse disease involving LAD, LCx, and proximal RCA. 2. Hazy 80% stenosis involving mid RCA. 3. Patent stent in mid LCx. 4. Normal LV contraction and filling pressure. 5. Successful PCI to mid RCA with placement of Resolute Onyx 3.0 x 12 mm drug-eluting stent.  RECOMMENDATIONS: 1. DAPT with aspirin and ticagrelor for at least 12 months. 2. Aggressive secondary prevention. 3. Anticipate discharge tomorrow.  Nelva Bush, MD [facility] Pager: [phone]

## 2017-07-03 NOTE — Interval H&P Note (Signed)
History and Physical Interval Note:  07/03/2017 8:57 AM  Eric Ball  has presented today for cardiac catheterization, with the diagnosis of unstable angina  The various methods of treatment have been discussed with the patient and family. After consideration of risks, benefits and other options for treatment, the patient has consented to  Procedure(s): RIGHT/LEFT HEART CATH AND CORONARY ANGIOGRAPHY (N/A) as a surgical intervention .  The patient's history has been reviewed, patient examined, no change in status, stable for surgery.  I have reviewed the patient's chart and labs.  Questions were answered to the patient's satisfaction.    Cath Lab Visit (complete for each Cath Lab visit)  Clinical Evaluation Leading to the Procedure:   ACS: Yes.    Non-ACS:  N/A  Adelise Buswell

## 2017-07-03 NOTE — Progress Notes (Signed)
TR BAND REMOVAL  LOCATION:  right radial  DEFLATED PER PROTOCOL:  Yes.    TIME BAND OFF / DRESSING APPLIED:   2030   SITE UPON ARRIVAL:   Level 0  SITE AFTER BAND REMOVAL:  Level 0  CIRCULATION SENSATION AND MOVEMENT:  Within Normal Limits  Yes.    COMMENTS:  Delayed deflation and removal of band due to bleeding otherwise site WNL and patient given instruction when to call the RN if he sees bleeding and precautions to use his arm. Pulse present and good capillary refill.

## 2017-07-04 LAB — POCT ACTIVATED CLOTTING TIME
ACTIVATED CLOTTING TIME: 263 s
ACTIVATED CLOTTING TIME: 301 s

## 2017-07-04 LAB — CBC
HEMATOCRIT: 39.2 % (ref 39.0–52.0)
Hemoglobin: 13 g/dL (ref 13.0–17.0)
MCH: 29.2 pg (ref 26.0–34.0)
MCHC: 33.2 g/dL (ref 30.0–36.0)
MCV: 88.1 fL (ref 78.0–100.0)
PLATELETS: 209 10*3/uL (ref 150–400)
RBC: 4.45 MIL/uL (ref 4.22–5.81)
RDW: 12.4 % (ref 11.5–15.5)
WBC: 7.2 10*3/uL (ref 4.0–10.5)

## 2017-07-04 LAB — GLUCOSE, CAPILLARY: Glucose-Capillary: 137 mg/dL — ABNORMAL HIGH (ref 65–99)

## 2017-07-04 LAB — BASIC METABOLIC PANEL
Anion gap: 9 (ref 5–15)
BUN: 12 mg/dL (ref 6–20)
CO2: 21 mmol/L — ABNORMAL LOW (ref 22–32)
CREATININE: 0.98 mg/dL (ref 0.61–1.24)
Calcium: 8.7 mg/dL — ABNORMAL LOW (ref 8.9–10.3)
Chloride: 104 mmol/L (ref 101–111)
Glucose, Bld: 148 mg/dL — ABNORMAL HIGH (ref 65–99)
POTASSIUM: 4.1 mmol/L (ref 3.5–5.1)
SODIUM: 134 mmol/L — AB (ref 135–145)

## 2017-07-04 LAB — POCT I-STAT 3, ART BLOOD GAS (G3+)
ACID-BASE DEFICIT: 6 mmol/L — AB (ref 0.0–2.0)
Bicarbonate: 18.8 mmol/L — ABNORMAL LOW (ref 20.0–28.0)
O2 Saturation: 98 %
TCO2: 20 mmol/L — ABNORMAL LOW (ref 22–32)
pCO2 arterial: 34.3 mmHg (ref 32.0–48.0)
pH, Arterial: 7.346 — ABNORMAL LOW (ref 7.350–7.450)
pO2, Arterial: 115 mmHg — ABNORMAL HIGH (ref 83.0–108.0)

## 2017-07-04 LAB — POCT I-STAT 3, VENOUS BLOOD GAS (G3P V)
ACID-BASE DEFICIT: 6 mmol/L — AB (ref 0.0–2.0)
Bicarbonate: 20 mmol/L (ref 20.0–28.0)
O2 Saturation: 68 %
TCO2: 21 mmol/L — ABNORMAL LOW (ref 22–32)
pCO2, Ven: 40.5 mmHg — ABNORMAL LOW (ref 44.0–60.0)
pH, Ven: 7.302 (ref 7.250–7.430)
pO2, Ven: 39 mmHg (ref 32.0–45.0)

## 2017-07-04 MED ORDER — EZETIMIBE 10 MG PO TABS: 10.0000 mg | ORAL_TABLET | Freq: Every day | ORAL | 3 refills | Status: DC

## 2017-07-04 MED ORDER — TICAGRELOR 90 MG PO TABS: 90.0000 mg | ORAL_TABLET | Freq: Two times a day (BID) | ORAL | 0 refills | Status: DC

## 2017-07-04 MED ORDER — ROSUVASTATIN CALCIUM 10 MG PO TABS: 10.0000 mg | ORAL_TABLET | Freq: Every day | ORAL | 3 refills | Status: DC

## 2017-07-04 MED ORDER — TICAGRELOR 90 MG PO TABS: 90.0000 mg | ORAL_TABLET | Freq: Two times a day (BID) | ORAL | 11 refills | Status: DC

## 2017-07-04 NOTE — Discharge Summary (Signed)
Discharge Summary    Patient ID: Eric Ball,  MRN: [id number], DOB/AGE: [birth year] 66 y.o.  Admit date: 06/30/2017 Discharge date: 07/04/2017  Primary Care Provider: [employer] Primary Cardiologist: Dr. Stanford Breed  Discharge Diagnoses    Principal Problem:   Unstable angina [facility]) Active Problems:   Coronary artery disease   DM II (diabetes mellitus, type II), controlled ([place])   Hypercholesterolemia   Hypertension   Allergies Allergies  Allergen Reactions  . Beta Adrenergic Blockers Other (See Comments)    High doses make joints hurt.    Diagnostic Studies/Procedures    Echo 07/01/2017 LV EF: 60% -   65%  Study Conclusions  - Left ventricle: The cavity size was normal. Wall thickness was   normal. Systolic function was normal. The estimated ejection   fraction was in the range of 60% to 65%. Wall motion was normal;   there were no regional wall motion abnormalities. Left   ventricular diastolic function parameters were normal. - Aortic valve: Valve area (VTI): 3.57 cm^2. Valve area (Vmax):   3.88 cm^2. Valve area (Vmean): 3.48 cm^2.    Cath 07/03/2017 Conclusion   Conclusions: 1. Mild to moderate, non-obstructive coronary artery disease involving the LAD, LCx, and proximal RCA. 2. Hazy, eccentric 70-80% stenosis involving the mid RCA. 3. Patent stent in the proximal/mid LCx. 4. Normal left ventricular contraction with upper normal to mildly elevated left and right heart filling pressures. 5. Low normal Fick cardiac output/index. 6. Successful PCI to mid RCA with placement of a Resolute Onyx 3.0 x 12 mm drug-eluting stent with 0% residual stenosis and TIMI-3 flow.  Recommendations: 1. Dual antiplatelet therapy with aspirin and ticagrelor for at least 12 months. 2. Aggressive secondary prevention.    _____________   History of Present Illness     Eric Ball is a 66 y.o. male w/ a history of HTN, HLD, DM, and multivessel CAD s/p multiple  prior stents presenting with exertional shortness of breath and fatigue. Symptoms have been ongoing for several weeks now, though have gotten worse over the past several days. Endorses occasional chest pain/pressure, nonradiating and lasting several minutes at a time. Patient has been avoiding strenuous activity as he fears he will have recurrence of the severe chest pain that he has had in the past with his MI's. Has been taking all medications regularly. No syncope, presycnope, orthopnea, edema. Not using nitroglycerin.   Has been off Repatha for several months now as his insurance stopped paying for it. Hasn't taken a statin in the interim.   In the ED, patient endorses labored breathing while laying down at rest. No chest pain. Initial troponin negative. ECG without ischemic changes. Given aspirin, started on heparin gtt for unstable angina.    Hospital Course      Patient was admitted for unstable angina.  Serial troponin was negative.  Echocardiogram obtained on 07/01/2017 showed EF 60-65%.  He eventually underwent cardiac catheterization on 07/03/2017 which showed hazy eccentric 70-80% stenosis in the mid RCA, this was treated with resolute Onyx 3.0 x 12 mm DES.  He was seen in the morning of 07/04/2017, he was doing well at that time without any recurrent chest pain or shortness of breath.  He has been seen by cardiac rehab.  He has also been seen by case manager, he was given 30-day free coupon for the Brilinta.  Our pharmacist is working on getting him back on Repatha given LDL of 187.   He is deemed  stable for discharge from cardiology perspective.  He will followup with Dr. Stanford Breed on 07/19/2017.   _____________  Discharge Vitals Blood pressure 120/74, pulse 77, temperature 98.6 F (37 C), temperature source Oral, resp. rate 17, height 5\' 10"  (1.778 m), weight 168 lb 10.4 oz (76.5 kg), SpO2 96 %.  Filed Weights   07/01/17 0255 07/01/17 0349 07/03/17 0530  Weight: 170 lb (77.1 kg)  167 lb 1.6 oz (75.8 kg) 168 lb 10.4 oz (76.5 kg)    Labs & Radiologic Studies    CBC Recent Labs    07/03/17 0024 07/04/17 0224  WBC 6.8 7.2  HGB 13.2 13.0  HCT 39.3 39.2  MCV 89.7 88.1  PLT 229 174   Basic Metabolic Panel Recent Labs    07/03/17 0024 07/04/17 0224  NA 135 134*  K 4.2 4.1  CL 103 104  CO2 23 21*  GLUCOSE 157* 148*  BUN 12 12  CREATININE 1.09 0.98  CALCIUM 8.6* 8.7*   _____________  Dg Chest 2 View  Result Date: 06/30/2017 CLINICAL DATA:  Shortness of breath. EXAM: CHEST  2 VIEW COMPARISON:  Chest x-ray report 09/25/2002. FINDINGS: Mediastinum hilar structures are normal. Lungs are clear. Heart is pulmonary vascularity noted. Mild left base subsegmental atelectasis. No pleural effusion or pneumothorax. Mild elevation left hemidiaphragm. Degenerative changes thoracic spine. IMPRESSION: Mild left base subsegmental atelectasis. Mild elevation left hemidiaphragm. Electronically Signed   By: Marcello Moores  Register   On: 06/30/2017 17:03   Disposition   Pt is being discharged home today in good condition.  Follow-up Plans & Appointments    Follow-up Information    Lelon Perla, MD Follow up on 07/19/2017.   Specialty:  Cardiology Why:  8:30AM. Cardiology office visit.  Contact information: [address] [place] [address] [phone]        [employer], MD. Schedule an appointment as soon as possible for a visit.   Specialty:  Internal Medicine Contact information: [address] [place] [address] [phone]          Discharge Instructions    Amb Referral to Cardiac Rehabilitation   Complete by:  As directed    Diagnosis:   Coronary Stents NSTEMI     Diet - low sodium heart healthy   Complete by:  As directed    Increase activity slowly   Complete by:  As directed       Discharge Medications   Allergies as of 07/04/2017      Reactions   Beta Adrenergic Blockers Other (See Comments)   High doses  make joints hurt.      Medication List    TAKE these medications   aspirin 81 MG tablet Take 81 mg by mouth daily.   carvedilol 12.5 MG tablet Commonly known as:  COREG Take 1 tablet (12.5 mg total) by mouth 2 (two) times daily with a meal.   ezetimibe 10 MG tablet Commonly known as:  ZETIA Take 1 tablet (10 mg total) by mouth daily. Start taking on:  07/05/2017   glimepiride 1 MG tablet Commonly known as:  AMARYL Take 2 tablets by mouth daily.   irbesartan 150 MG tablet Commonly known as:  AVAPRO Take 1 tablet (150 mg total) by mouth daily.   metFORMIN 1000 MG tablet Commonly known as:  GLUCOPHAGE Take 1 tablet (1,000 mg total) by mouth 2 (two) times daily with a meal.   nitroGLYCERIN 0.4 MG SL tablet Commonly known as:  NITROSTAT Place 1 tablet (  0.4 mg total) under the tongue every 5 (five) minutes as needed for chest pain.   omega-3 acid ethyl esters 1 g capsule Commonly known as:  LOVAZA Take 2 capsules (2 g total) by mouth 2 (two) times daily. NEED OV.   pantoprazole 40 MG tablet Commonly known as:  PROTONIX Take 1 tablet (40 mg total) by mouth daily.   rosuvastatin 10 MG tablet Commonly known as:  CRESTOR Take 1 tablet (10 mg total) by mouth daily at 6 PM.   tamsulosin 0.4 MG Caps capsule Commonly known as:  FLOMAX Take 0.4 mg by mouth daily.   ticagrelor 90 MG Tabs tablet Commonly known as:  BRILINTA Take 1 tablet (90 mg total) by mouth 2 (two) times daily.        Aspirin prescribed at discharge?  Yes High Intensity Statin Prescribed? (Lipitor 40-80mg  or Crestor 20-40mg ): Yes Beta Blocker Prescribed? Yes For EF <40%, was ACEI/ARB Prescribed? Yes ADP Receptor Inhibitor Prescribed? (i.e. Plavix etc.-Includes Medically Managed Patients): Yes For EF <40%, Aldosterone Inhibitor Prescribed? No: EF normal Was EF assessed during THIS hospitalization? Yes Was Cardiac Rehab II ordered? (Included Medically managed Patients): Yes   Outstanding Labs/Studies    FLP and LFT in 6-8 weeks   Duration of Discharge Encounter   Greater than 30 minutes including physician time.  Hilbert Corrigan NP 07/04/2017, 12:18 PM

## 2017-07-04 NOTE — Care Management Note (Signed)
Case Management Note  Patient Details  Name: Eric Ball MRN: [id number] Date of Birth: June 27, 1951  Subjective/Objective:   Pt admitted with CP                  Action/Plan:   PTA independent from home with wife.  Pt has PCP and active prescription coverage.  CM provided free 30 day card for Brilinta and submitted benefit check.  CM received consult for assistance with Repatha however Dr Claiborne Billings informed CM that assistance was being sought via [place] pharmacist - CM did not need to submit benefit check for drug as his team is actively trying to gain approval via insurance (pt will not discharge home on Repatha). CM contacted pharmacy of choice [facility] in [place] confirmed they can fill prescription today   Expected Discharge Date:                  Expected Discharge Plan:  Home/Self Care  In-House Referral:     Discharge planning Services  CM Consult, Medication Assistance  Post Acute Care Choice:    Choice offered to:     DME Arranged:    DME Agency:     HH Arranged:    HH Agency:     Status of Service:  In process, will continue to follow  If discussed at Long Length of Stay Meetings, dates discussed:    Additional Comments:  [name], RN 07/04/2017, 10:36 AM

## 2017-07-04 NOTE — Progress Notes (Signed)
Progress Note  Patient Name: Eric Ball Date of Encounter: 07/04/2017  Primary Cardiologist: Dr. Stanford Breed  Subjective   Denies any chest pain or SOB.   Inpatient Medications    Scheduled Meds: . aspirin EC  81 mg Oral Daily  . carvedilol  12.5 mg Oral BID WC  . enoxaparin (LOVENOX) injection  40 mg Subcutaneous Q24H  . ezetimibe  10 mg Oral Daily  . insulin aspart  0-15 Units Subcutaneous TID WC  . insulin aspart  0-5 Units Subcutaneous QHS  . insulin aspart  4 Units Subcutaneous TID WC  . irbesartan  150 mg Oral Daily  . omega-3 acid ethyl esters  2 g Oral BID  . pantoprazole  40 mg Oral Daily  . rosuvastatin  10 mg Oral q1800  . sodium chloride flush  3 mL Intravenous Q12H  . tamsulosin  0.4 mg Oral Daily  . ticagrelor  90 mg Oral BID   Continuous Infusions: . sodium chloride     PRN Meds: sodium chloride, acetaminophen, nitroGLYCERIN, ondansetron (ZOFRAN) IV, sodium chloride flush   Vital Signs    Vitals:   07/03/17 2247 07/04/17 0342 07/04/17 0730 07/04/17 0834  BP: 107/62 102/62  120/74  Pulse: 74 69  77  Resp: 16 17    Temp: 98.5 F (36.9 C) 97.7 F (36.5 C) 98.6 F (37 C)   TempSrc: Oral Oral Oral   SpO2: 97% 96%    Weight:      Height:        Intake/Output Summary (Last 24 hours) at 07/04/2017 0851 Last data filed at 07/03/2017 2159 Gross per 24 hour  Intake 753 ml  Output 350 ml  Net 403 ml   Filed Weights   07/01/17 0255 07/01/17 0349 07/03/17 0530  Weight: 170 lb (77.1 kg) 167 lb 1.6 oz (75.8 kg) 168 lb 10.4 oz (76.5 kg)    Telemetry    NSR without significant ventricular ectopy - Personally Reviewed  ECG    NSR without significant ST-T wave changes- Personally Reviewed  Physical Exam   GEN: No acute distress.   Neck: No JVD Cardiac: RRR, no murmurs, rubs, or gallops.  Respiratory: Clear to auscultation bilaterally. GI: Soft, nontender, non-distended  MS: No edema; No deformity. Neuro:  Nonfocal  Psych: Normal affect    Labs    Chemistry Recent Labs  Lab 07/01/17 0300 07/03/17 0024 07/04/17 0224  NA 135 135 134*  K 3.7 4.2 4.1  CL 100* 103 104  CO2 25 23 21*  GLUCOSE 166* 157* 148*  BUN 18 12 12   CREATININE 1.20 1.09 0.98  CALCIUM 9.0 8.6* 8.7*  GFRNONAA >60 >60 >60  GFRAA >60 >60 >60  ANIONGAP 10 9 9      Hematology Recent Labs  Lab 07/02/17 0847 07/03/17 0024 07/04/17 0224  WBC 6.0 6.8 7.2  RBC 4.43 4.38 4.45  HGB 13.4 13.2 13.0  HCT 39.3 39.3 39.2  MCV 88.7 89.7 88.1  MCH 30.2 30.1 29.2  MCHC 34.1 33.6 33.2  RDW 12.4 12.6 12.4  PLT 209 229 209    Cardiac EnzymesNo results for input(s): TROPONINI in the last 168 hours.  Recent Labs  Lab 06/30/17 1628 07/01/17 0148  TROPIPOC 0.00 0.00     BNPNo results for input(s): BNP, PROBNP in the last 168 hours.   DDimer No results for input(s): DDIMER in the last 168 hours.   Radiology    No results found.  Cardiac Studies   Echo 07/01/2017 LV  EF: 60% -   65%  Study Conclusions  - Left ventricle: The cavity size was normal. Wall thickness was   normal. Systolic function was normal. The estimated ejection   fraction was in the range of 60% to 65%. Wall motion was normal;   there were no regional wall motion abnormalities. Left   ventricular diastolic function parameters were normal. - Aortic valve: Valve area (VTI): 3.57 cm^2. Valve area (Vmax):   3.88 cm^2. Valve area (Vmean): 3.48 cm^2.   Cath 07/03/2017 Conclusion   Conclusions: 1. Mild to moderate, non-obstructive coronary artery disease involving the LAD, LCx, and proximal RCA. 2. Hazy, eccentric 70-80% stenosis involving the mid RCA. 3. Patent stent in the proximal/mid LCx. 4. Normal left ventricular contraction with upper normal to mildly elevated left and right heart filling pressures. 5. Low normal Fick cardiac output/index. 6. Successful PCI to mid RCA with placement of a Resolute Onyx 3.0 x 12 mm drug-eluting stent with 0% residual stenosis and  TIMI-3 flow.  Recommendations: 1. Dual antiplatelet therapy with aspirin and ticagrelor for at least 12 months. 2. Aggressive secondary prevention.     Patient Profile     66 y.o. male presented with unstable angina and underwent DES to mid RCA  Assessment & Plan    1. Unstable angina  - cath 07/03/2017 80% mid RCA treated with Onyx Resolute 3.0 x12 DES.   - continue ASA, brilinta, coreg and irbesartan. BP well controlled.   - discharge today. Need case manager to review Brilinta cost.   2. IDDM  3. HLD: LDL 187, Crestor increased to 10 mg, zetia added. Will add repatha in the lipid clinic   For questions or updates, please contact [facility] Please consult [email or web address] for contact info under Cardiology/STEMI.      Hilbert Corrigan, PA  07/04/2017, 8:51 AM     Patient seen and examined. Agree with assessment and plan. Tolerated increase crestor last night and zetia addition; with plan to re-institute Repatha.  Ambulate this am and plan DC today.    Troy Sine, MD, [facility] 07/04/2017 9:54 AM

## 2017-07-04 NOTE — Progress Notes (Signed)
discharged home by wheelchair accompanied by wife, discharge instructions given, belongings  taken home.

## 2017-07-04 NOTE — Progress Notes (Signed)
CARDIAC REHAB PHASE I   PRE:  Rate/Rhythm: 76 SR  BP:  Sitting: 115/85        SaO2: 98 RA  MODE:  Ambulation: 280 ft   POST:  Rate/Rhythm: 88 SR  BP:  Sitting: 116/79         SaO2: 98 RA  Pt ambulated 280 ft on RA, handheld assist, slow, steady gait, tolerated well with no complaints. Completed MI/stent education with pt and wife at bedside.  Reviewed risk factors, MI book, anti-platelet therapy, stent card, activity restrictions, ntg, exercise, heart healthy and diabetes diet handouts and phase 2 cardiac rehab. Pt verbalized understanding. Pt agrees to phase 2 cardiac rehab referral, will send to [facility]. Pt to edge of bed per pt request after walk, call bell within reach.   [phone] Lenna Sciara, RN, BSN 07/04/2017 10:34 AM

## 2017-07-04 NOTE — Care Management (Signed)
Attending CM needs Repatha dose, frequency and duration to submit benefit check  Brilinta benefit check submitted.  CM will provide pt free 30 day [place], Therapist, sports, BSN [phone]

## 2017-07-04 NOTE — Discharge Instructions (Signed)
No driving for 24 hours. No lifting over 5 lbs for 1 week. No sexual activity for 1 week. Keep procedure site clean & dry. If you notice increased pain, swelling, bleeding or pus, call/return!  You may shower, but no soaking baths/hot tubs/pools for 1 week.  ° ° °

## 2017-07-05 ENCOUNTER — Telehealth: Payer: Self-pay | Admitting: Cardiology

## 2017-07-05 ENCOUNTER — Telehealth (HOSPITAL_COMMUNITY): Payer: Self-pay

## 2017-07-05 NOTE — Telephone Encounter (Signed)
Pt notified to keep on for 3 days post-op, pt notified to make sure to call back if bandage bleeds thru or if when removing it should start to bleed, go direct.ly to the ER and place direct pressure. Will come to scheduled appt for follow up and has read d/c instructions and understands all of it.

## 2017-07-05 NOTE — Telephone Encounter (Signed)
New Message  Pt would like to know when he will be able to remove the badge from his wound. Please call back to discuss

## 2017-07-05 NOTE — Telephone Encounter (Signed)
Patients insurance is active and benefits verified through [facility] - No co-pay, no deductible, out of pocket amount of $500.00/$30.00 has been met, no co-insurance, and no pre-authorization is required. Passport/reference [phone]  Patient's insurance is NOT active with [employer] until 07/25/2017.  Patient will be contacted and scheduled after their follow up appt with the Cardiologist office upon review by the RN Navigator.

## 2017-07-05 NOTE — Progress Notes (Deleted)
HPI: FU CAD; history of acute lateral myocardial infarction 2004 followed by cardiac catheterization and PCI with bare-metal stents to the mid circumflex. Patient had repeat PCI of circumflex in 2015. Admitted with UA 12/18; cardiac catheterization on 07/03/2017 revealed a 70-80% mid right coronary artery, 60 OM1, 40 OM2. Circumflex stent was patent. LV function normal. Patient had PCI of RCA with drug-eluting stent. Echocardiogram December 2018 showed normal LV systolic function. Since last seen,   Current Outpatient Medications  Medication Sig Dispense Refill  . aspirin 81 MG tablet Take 81 mg by mouth daily.      . carvedilol (COREG) 12.5 MG tablet Take 1 tablet (12.5 mg total) by mouth 2 (two) times daily with a meal. 180 tablet 0  . ezetimibe (ZETIA) 10 MG tablet Take 1 tablet (10 mg total) by mouth daily. 90 tablet 3  . glimepiride (AMARYL) 1 MG tablet Take 2 tablets by mouth daily.     . irbesartan (AVAPRO) 150 MG tablet Take 1 tablet (150 mg total) by mouth daily. 90 tablet 3  . metFORMIN (GLUCOPHAGE) 1000 MG tablet Take 1 tablet (1,000 mg total) by mouth 2 (two) times daily with a meal. 180 tablet 3  . nitroGLYCERIN (NITROSTAT) 0.4 MG SL tablet Place 1 tablet (0.4 mg total) under the tongue every 5 (five) minutes as needed for chest pain. 25 tablet 3  . omega-3 acid ethyl esters (LOVAZA) 1 g capsule Take 2 capsules (2 g total) by mouth 2 (two) times daily. NEED OV. 360 capsule 0  . pantoprazole (PROTONIX) 40 MG tablet Take 1 tablet (40 mg total) by mouth daily. 90 tablet 3  . rosuvastatin (CRESTOR) 10 MG tablet Take 1 tablet (10 mg total) by mouth daily at 6 PM. 90 tablet 3  . tamsulosin (FLOMAX) 0.4 MG CAPS capsule Take 0.4 mg by mouth daily.    . ticagrelor (BRILINTA) 90 MG TABS tablet Take 1 tablet (90 mg total) by mouth 2 (two) times daily. 60 tablet 11   No current facility-administered medications for this visit.      Past Medical History:  Diagnosis Date  .  Complication of anesthesia    "I'm slow to come out"  . Coronary artery disease 2015   PTCA with DES to Cx; PCI  BMS 2004  . GERD (gastroesophageal reflux disease)   . H/O hiatal hernia   . History of myocardial infarction    with a stent placed at that time  . Hypercholesterolemia    with intolerant ot many of the cholesterol medicaitons  . Hypertension    essentai hypertension  . Ischemic heart disease   . Myocardial infarction [facility]) 2004   with a stent placed at that time  . Nephrosis  1960's  . Sleep apnea    "suppose to wear mask; I don't wear it enough" (11/15/2013)  . Type II diabetes mellitus ([place])     Past Surgical History:  Procedure Laterality Date  . BACK SURGERY    . COLONOSCOPY  10/07/2011   [name]:multiple colonic polyps removed/colonic diverticulosis, tubular adenoma  . COLONOSCOPY N/A 12/08/2014   Procedure: COLONOSCOPY;  Surgeon: [name], MD;  [facility];  Service: Endoscopy;  Laterality: N/A;  930  . CORONARY ANGIOPLASTY WITH STENT PLACEMENT  09/25/2002   EF-approximately 45% / stent placement to the mid arteriovenous circumflex with reduction of 99% narrowing to 0% with placement of a 3.0 x 16 mm Express II stent with improvement of  TIMI grade 2 to TIMI grade 3 flow.  . CORONARY ANGIOPLASTY WITH STENT PLACEMENT  11/15/2013   "1"  . CORONARY STENT INTERVENTION N/A 07/03/2017   Procedure: CORONARY STENT INTERVENTION;  Surgeon: Nelva Bush, MD;  Location: [place] CV LAB;  Service: Cardiovascular;  Laterality: N/A;  . LEFT HEART CATHETERIZATION WITH CORONARY ANGIOGRAM N/A 11/15/2013   Procedure: LEFT HEART CATHETERIZATION WITH CORONARY ANGIOGRAM;  Surgeon: Burnell Blanks, MD;  Location: [facility] CATH LAB;  Service: Cardiovascular;  Laterality: N/A;  . LUMBAR [place]  . NASAL SEPTUM SURGERY  02/20/2001   Septal reconstruction and turbinate reduction  . RIGHT/LEFT HEART CATH AND CORONARY ANGIOGRAPHY N/A 07/03/2017    Procedure: RIGHT/LEFT HEART CATH AND CORONARY ANGIOGRAPHY;  Surgeon: Nelva Bush, MD;  Location: [place] CV LAB;  Service: Cardiovascular;  Laterality: N/A;    Social History   Socioeconomic History  . Marital status: Married    Spouse name: Not on file  . Number of children: Not on file  . Years of education: Not on file  . Highest education level: Not on file  Social Needs  . Financial resource strain: Not on file  . Food insecurity - worry: Not on file  . Food insecurity - inability: Not on file  . Transportation needs - medical: Not on file  . Transportation needs - non-medical: Not on file  Occupational History  . Not on file  Tobacco Use  . Smoking status: Never Smoker  . Smokeless tobacco: Never Used  . Tobacco comment: Never smoker  Substance and Sexual Activity  . Alcohol use: No    Alcohol/week: 0.0 oz  . Drug use: No  . Sexual activity: Yes  Other Topics Concern  . Not on file  Social History Narrative  . Not on file    Family History  Problem Relation Age of Onset  . Coronary artery disease Father        with multiple angioplasties  . Healthy Sister   . Hypertension Unknown        runs in the family  . Colon cancer Neg Hx     ROS: no fevers or chills, productive cough, hemoptysis, dysphasia, odynophagia, melena, hematochezia, dysuria, hematuria, rash, seizure activity, orthopnea, PND, pedal edema, claudication. Remaining systems are negative.  Physical Exam: Well-developed well-nourished in no acute distress.  Skin is warm and dry.  HEENT is normal.  Neck is supple.  Chest is clear to auscultation with normal expansion.  Cardiovascular exam is regular rate and rhythm.  Abdominal exam nontender or distended. No masses palpated. Extremities show no edema. neuro grossly intact  ECG- personally reviewed  A/P  1  [name], MD

## 2017-07-06 MED ORDER — EVOLOCUMAB 140 MG/ML ~~LOC~~ SOAJ: 1.0000 "pen " | SUBCUTANEOUS | 11 refills | Status: DC

## 2017-07-06 NOTE — Addendum Note (Signed)
Addended by: SUPPLE, MEGAN E on: 07/06/2017 05:07 PM   Modules accepted: Orders

## 2017-07-06 NOTE — Progress Notes (Signed)
HPI: FU CAD; history of acute lateral myocardial infarction 2004 followed by cardiac catheterization and PCI with bare-metal stents to the mid circumflex. Had PCI of circumflex in 2015 due to restenosis. Admitted with unstable angina December 2018. Cardiac catheterization showed a 70-80% RCA and patent stent in the circumflex. 60% OM1 and 40% OM 2. Patient had PCI of RCA with drug-eluting stent. Echocardiogram December 2018 showed normal LV systolic function. Since last seen, he has some dyspnea on exertion and fatigue.No chest pain, palpitations or syncope.  Current Outpatient Medications  Medication Sig Dispense Refill  . aspirin 81 MG tablet Take 81 mg by mouth daily.      . carvedilol (COREG) 12.5 MG tablet Take 1 tablet (12.5 mg total) by mouth 2 (two) times daily with a meal. 180 tablet 0  . Evolocumab (REPATHA SURECLICK) 829 MG/ML SOAJ Inject 1 pen into the skin every 14 (fourteen) days. 2 pen 11  . ezetimibe (ZETIA) 10 MG tablet Take 1 tablet (10 mg total) by mouth daily. 90 tablet 3  . glimepiride (AMARYL) 1 MG tablet Take 2 tablets by mouth daily.     . irbesartan (AVAPRO) 150 MG tablet Take 1 tablet (150 mg total) by mouth daily. 90 tablet 3  . metFORMIN (GLUCOPHAGE) 1000 MG tablet Take 1 tablet (1,000 mg total) by mouth 2 (two) times daily with a meal. 180 tablet 3  . nitroGLYCERIN (NITROSTAT) 0.4 MG SL tablet Place 1 tablet (0.4 mg total) under the tongue every 5 (five) minutes as needed for chest pain. 25 tablet 3  . omega-3 acid ethyl esters (LOVAZA) 1 g capsule Take 2 capsules (2 g total) by mouth 2 (two) times daily. NEED OV. 360 capsule 0  . pantoprazole (PROTONIX) 40 MG tablet Take 1 tablet (40 mg total) by mouth daily. 90 tablet 3  . rosuvastatin (CRESTOR) 10 MG tablet Take 1 tablet (10 mg total) by mouth daily at 6 PM. 90 tablet 3  . tamsulosin (FLOMAX) 0.4 MG CAPS capsule Take 0.4 mg by mouth daily.    . ticagrelor (BRILINTA) 90 MG TABS tablet Take 1 tablet (90 mg  total) by mouth 2 (two) times daily. 60 tablet 11   No current facility-administered medications for this visit.      Past Medical History:  Diagnosis Date  . Complication of anesthesia    "I'm slow to come out"  . Coronary artery disease 2015   PTCA with DES to Cx; PCI  BMS 2004  . GERD (gastroesophageal reflux disease)   . H/O hiatal hernia   . History of myocardial infarction    with a stent placed at that time  . Hypercholesterolemia    with intolerant ot many of the cholesterol medicaitons  . Hypertension    essentai hypertension  . Ischemic heart disease   . Myocardial infarction [facility]) 2004   with a stent placed at that time  . Nephrosis  1960's  . Sleep apnea    "suppose to wear mask; I don't wear it enough" (11/15/2013)  . Type II diabetes mellitus ([place])     Past Surgical History:  Procedure Laterality Date  . BACK SURGERY    . COLONOSCOPY  10/07/2011   [name]:multiple colonic polyps removed/colonic diverticulosis, tubular adenoma  . COLONOSCOPY N/A 12/08/2014   Procedure: COLONOSCOPY;  Surgeon: [name], MD;  [facility];  Service: Endoscopy;  Laterality: N/A;  930  . CORONARY ANGIOPLASTY WITH STENT PLACEMENT  09/25/2002  EF-approximately 45% / stent placement to the mid arteriovenous circumflex with reduction of 99% narrowing to 0% with placement of a 3.0 x 16 mm Express II stent with improvement of TIMI grade 2 to TIMI grade 3 flow.  . CORONARY ANGIOPLASTY WITH STENT PLACEMENT  11/15/2013   "1"  . CORONARY STENT INTERVENTION N/A 07/03/2017   Procedure: CORONARY STENT INTERVENTION;  Surgeon: Nelva Bush, MD;  Location: [place] CV LAB;  Service: Cardiovascular;  Laterality: N/A;  . LEFT HEART CATHETERIZATION WITH CORONARY ANGIOGRAM N/A 11/15/2013   Procedure: LEFT HEART CATHETERIZATION WITH CORONARY ANGIOGRAM;  Surgeon: Burnell Blanks, MD;  Location: [facility] CATH LAB;  Service: Cardiovascular;  Laterality: N/A;  . LUMBAR [place]  . NASAL SEPTUM SURGERY  02/20/2001   Septal reconstruction and turbinate reduction  . RIGHT/LEFT HEART CATH AND CORONARY ANGIOGRAPHY N/A 07/03/2017   Procedure: RIGHT/LEFT HEART CATH AND CORONARY ANGIOGRAPHY;  Surgeon: Nelva Bush, MD;  Location: [place] CV LAB;  Service: Cardiovascular;  Laterality: N/A;    Social History   Socioeconomic History  . Marital status: Married    Spouse name: Not on file  . Number of children: Not on file  . Years of education: Not on file  . Highest education level: Not on file  Social Needs  . Financial resource strain: Not on file  . Food insecurity - worry: Not on file  . Food insecurity - inability: Not on file  . Transportation needs - medical: Not on file  . Transportation needs - non-medical: Not on file  Occupational History  . Not on file  Tobacco Use  . Smoking status: Never Smoker  . Smokeless tobacco: Never Used  . Tobacco comment: Never smoker  Substance and Sexual Activity  . Alcohol use: No    Alcohol/week: 0.0 oz  . Drug use: No  . Sexual activity: Yes  Other Topics Concern  . Not on file  Social History Narrative  . Not on file    Family History  Problem Relation Age of Onset  . Coronary artery disease Father        with multiple angioplasties  . Healthy Sister   . Hypertension Unknown        runs in the family  . Colon cancer Neg Hx     ROS: myalgias from Crestor but no fevers or chills, productive cough, hemoptysis, dysphasia, odynophagia, melena, hematochezia, dysuria, hematuria, rash, seizure activity, orthopnea, PND, pedal edema, claudication. Remaining systems are negative.  Physical Exam: Well-developed well-nourished in no acute distress.  Skin is warm and dry.  HEENT is normal.  Neck is supple.  Chest is clear to auscultation with normal expansion.  Cardiovascular exam is regular rate and rhythm.  Abdominal exam nontender or distended. No masses palpated. Extremities show no edema. neuro  grossly intact   A/P  1 coronary artery disease-patient doing well with no chest pain. Continue aspirin, Brilinta. We will continue Brilinta for 1 year and then discontinue.  2 hypertension-blood pressure is controlled. Continue present medications.  3 hyperlipidemia-patient has been intolerant to statins in the past. He has developed myalgias on Crestor. We will discontinue. Continue Zetia. He is also going to resume repatha. Check lipids and liver 6 weeks after resuming.  [name], MD

## 2017-07-06 NOTE — Telephone Encounter (Signed)
Pt still has not heard from [employer] regarding [place] patient assistance. They have lost paperwork 3 separate times which has had to be refaxed. I have called twice and pt has called once in the past 2 months and each time we have been informed that the paperwork is "being processed." Unfortunately the wait time is more than an hour any time the foundation is called.  Pt had a heart cath recently and his LDL was checked and extremely elevated at 187. Discussed with pt that [place] have come down about 60% since we first started this process, which would bring monthly copay to about $150. Pt is agreeable to paying for this in the mean time while we continue to wait on financial assistance. New rx has been sent in to specialty pharmacy.  Pt sees Dr Stanford Breed tomorrow and plans to discuss lipid lowering medication since he states he was started on statins again in the hospital which he cannot tolerate.  Will forward to Dr Stanford Breed as an Juluis [place]ier.

## 2017-07-07 ENCOUNTER — Ambulatory Visit (INDEPENDENT_AMBULATORY_CARE_PROVIDER_SITE_OTHER): Payer: PPO | Admitting: Cardiology

## 2017-07-07 ENCOUNTER — Encounter: Payer: Self-pay | Admitting: Cardiology

## 2017-07-07 VITALS — BP 110/72 | HR 78 | Ht 70.0 in | Wt 174.0 lb

## 2017-07-07 DIAGNOSIS — I2583 Coronary atherosclerosis due to lipid rich plaque: Secondary | ICD-10-CM

## 2017-07-07 DIAGNOSIS — E7800 Pure hypercholesterolemia, unspecified: Secondary | ICD-10-CM | POA: Diagnosis not present

## 2017-07-07 DIAGNOSIS — I2510 Atherosclerotic heart disease of native coronary artery without angina pectoris: Secondary | ICD-10-CM

## 2017-07-07 DIAGNOSIS — I10 Essential (primary) hypertension: Secondary | ICD-10-CM | POA: Diagnosis not present

## 2017-07-07 NOTE — Patient Instructions (Signed)
Stop Crestor   Fasting lipid and liver panels 6 weeks after restarting repatha   Your physician recommends that you schedule a follow-up appointment with Dr.Crenshaw   Friday 09/29/17 at 8:20 am.

## 2017-07-19 ENCOUNTER — Ambulatory Visit: Payer: 59 | Admitting: Cardiology

## 2017-07-20 ENCOUNTER — Telehealth (HOSPITAL_COMMUNITY): Payer: Self-pay

## 2017-07-20 NOTE — Telephone Encounter (Signed)
Called and spoke with patient in regards to Cardiac Rehab - Patient is interested in the program. Scheduled orientation on 08/22/2017 at 8:00am. Patient will attend the 8:15am exc class.

## 2017-08-01 ENCOUNTER — Other Ambulatory Visit: Payer: Self-pay | Admitting: Urology

## 2017-08-01 DIAGNOSIS — R972 Elevated prostate specific antigen [PSA]: Secondary | ICD-10-CM

## 2017-08-02 ENCOUNTER — Telehealth: Payer: Self-pay | Admitting: Cardiology

## 2017-08-02 ENCOUNTER — Other Ambulatory Visit: Payer: Self-pay | Admitting: Cardiology

## 2017-08-02 MED ORDER — TICAGRELOR 90 MG PO TABS: 90.0000 mg | ORAL_TABLET | Freq: Two times a day (BID) | ORAL | 0 refills | Status: DC

## 2017-08-02 NOTE — Telephone Encounter (Signed)
°*  STAT* If patient is at the pharmacy, call can be transferred to refill team.   1. Which medications need to be refilled? (please list name of each medication and dose if known)Brilinta ( Needs a new prescription sent)   2. Which pharmacy/location (including street and city if local pharmacy) is medication to be sent to?[facility] [place]   3. Do they need a 30 day or 90 day supply? [place]

## 2017-08-02 NOTE — Telephone Encounter (Signed)
Will forward to Dr Crenshaw so he will be aware 

## 2017-08-02 NOTE — Telephone Encounter (Signed)
Eric Ball, looks like you've seen him in the past - not sure if you know this info or not

## 2017-08-02 NOTE — Telephone Encounter (Signed)
Eric Ball is calling to let Dr. Stanford Breed know that he has started Repatha again on 07/14/17. Please call if you have any questions . Thanks

## 2017-08-07 DIAGNOSIS — I2510 Atherosclerotic heart disease of native coronary artery without angina pectoris: Secondary | ICD-10-CM | POA: Diagnosis not present

## 2017-08-07 DIAGNOSIS — I10 Essential (primary) hypertension: Secondary | ICD-10-CM | POA: Diagnosis not present

## 2017-08-07 DIAGNOSIS — E1165 Type 2 diabetes mellitus with hyperglycemia: Secondary | ICD-10-CM | POA: Diagnosis not present

## 2017-08-07 DIAGNOSIS — Z6824 Body mass index (BMI) 24.0-24.9, adult: Secondary | ICD-10-CM | POA: Diagnosis not present

## 2017-08-07 DIAGNOSIS — N400 Enlarged prostate without lower urinary tract symptoms: Secondary | ICD-10-CM | POA: Diagnosis not present

## 2017-08-07 DIAGNOSIS — Z1389 Encounter for screening for other disorder: Secondary | ICD-10-CM | POA: Diagnosis not present

## 2017-08-07 DIAGNOSIS — K219 Gastro-esophageal reflux disease without esophagitis: Secondary | ICD-10-CM | POA: Diagnosis not present

## 2017-08-10 NOTE — Telephone Encounter (Signed)
Called SNF again since pt's paperwork was sent in 4 months ago and apparently they closed out his application despite multiple previous calls where I was advised his application was in process. They will expedite his application.

## 2017-08-15 ENCOUNTER — Telehealth (HOSPITAL_COMMUNITY): Payer: Self-pay

## 2017-08-15 NOTE — Telephone Encounter (Signed)
Cardiac Rehab Medication Review by a Pharmacist  Does the patient  feel that his/her medications are working for him/her?  yes  Has the patient been experiencing any side effects to the medications prescribed?  no  Does the patient measure his/her own blood pressure or blood glucose at home?  yes   Does the patient have any problems obtaining medications due to transportation or finances?   no  Understanding of regimen: good Understanding of indications: good Potential of compliance: good    Pharmacist comments: Eric Ball is a pleasant 67 y/o M who I spoke with regarding his medications prior to his upcoming cardiac rehab appointment. He is tolerating his medications well, but states that he stopped taking the crestor and zetia due to intolerance and is now taking repatha. He reports his blood sugars ranging from the low 100's to the mid 200's depending on the time of day and is trying to improve his diet.  Patterson Hammersmith PharmD PGY1 Pharmacy Practice Resident 08/15/2017 5:29 PM

## 2017-08-22 ENCOUNTER — Ambulatory Visit (HOSPITAL_COMMUNITY): Payer: PPO

## 2017-08-25 ENCOUNTER — Ambulatory Visit
Admission: RE | Admit: 2017-08-25 | Discharge: 2017-08-25 | Disposition: A | Discharge status: Home / Self Care | Payer: PPO | Source: Ambulatory Visit | Attending: Urology | Admitting: Urology

## 2017-08-25 DIAGNOSIS — R972 Elevated prostate specific antigen [PSA]: Secondary | ICD-10-CM | POA: Diagnosis not present

## 2017-08-25 MED ORDER — GADOBENATE DIMEGLUMINE 529 MG/ML IV SOLN
17.0000 mL | Freq: Once | INTRAVENOUS | Status: AC | PRN
  Administered 2017-08-25: 17 mL via INTRAVENOUS

## 2017-08-28 ENCOUNTER — Ambulatory Visit (HOSPITAL_COMMUNITY): Payer: PPO

## 2017-08-29 ENCOUNTER — Telehealth: Payer: Self-pay | Admitting: Pharmacist

## 2017-08-29 NOTE — Telephone Encounter (Signed)
Repatha has been approved for patient assistance through the [employer]. He is aware and his shipment has been coordinated. He will go to [facility] for fasting lipids before his f/u appt with Dr Stanford Breed next month.

## 2017-08-30 ENCOUNTER — Ambulatory Visit (HOSPITAL_COMMUNITY): Payer: PPO

## 2017-08-31 DIAGNOSIS — N401 Enlarged prostate with lower urinary tract symptoms: Secondary | ICD-10-CM | POA: Diagnosis not present

## 2017-08-31 DIAGNOSIS — R351 Nocturia: Secondary | ICD-10-CM | POA: Diagnosis not present

## 2017-08-31 DIAGNOSIS — R972 Elevated prostate specific antigen [PSA]: Secondary | ICD-10-CM | POA: Diagnosis not present

## 2017-09-01 ENCOUNTER — Ambulatory Visit (HOSPITAL_COMMUNITY): Payer: PPO

## 2017-09-04 ENCOUNTER — Ambulatory Visit (HOSPITAL_COMMUNITY): Payer: PPO

## 2017-09-04 ENCOUNTER — Other Ambulatory Visit: Payer: Self-pay | Admitting: Cardiology

## 2017-09-04 DIAGNOSIS — Z7984 Long term (current) use of oral hypoglycemic drugs: Secondary | ICD-10-CM | POA: Diagnosis not present

## 2017-09-04 DIAGNOSIS — H25099 Other age-related incipient cataract, unspecified eye: Secondary | ICD-10-CM | POA: Diagnosis not present

## 2017-09-04 DIAGNOSIS — E1165 Type 2 diabetes mellitus with hyperglycemia: Secondary | ICD-10-CM | POA: Diagnosis not present

## 2017-09-04 DIAGNOSIS — H11159 Pinguecula, unspecified eye: Secondary | ICD-10-CM | POA: Diagnosis not present

## 2017-09-04 DIAGNOSIS — H52221 Regular astigmatism, right eye: Secondary | ICD-10-CM | POA: Diagnosis not present

## 2017-09-04 DIAGNOSIS — I10 Essential (primary) hypertension: Secondary | ICD-10-CM | POA: Diagnosis not present

## 2017-09-04 DIAGNOSIS — H524 Presbyopia: Secondary | ICD-10-CM | POA: Diagnosis not present

## 2017-09-04 DIAGNOSIS — E119 Type 2 diabetes mellitus without complications: Secondary | ICD-10-CM | POA: Diagnosis not present

## 2017-09-04 DIAGNOSIS — H5201 Hypermetropia, right eye: Secondary | ICD-10-CM | POA: Diagnosis not present

## 2017-09-04 NOTE — Telephone Encounter (Signed)
REFILL 

## 2017-09-06 ENCOUNTER — Ambulatory Visit (HOSPITAL_COMMUNITY): Payer: PPO

## 2017-09-08 ENCOUNTER — Ambulatory Visit (HOSPITAL_COMMUNITY): Payer: PPO

## 2017-09-11 ENCOUNTER — Ambulatory Visit (HOSPITAL_COMMUNITY): Payer: PPO

## 2017-09-12 ENCOUNTER — Encounter: Payer: Self-pay | Admitting: *Deleted

## 2017-09-13 ENCOUNTER — Ambulatory Visit (HOSPITAL_COMMUNITY): Payer: PPO

## 2017-09-15 ENCOUNTER — Ambulatory Visit (HOSPITAL_COMMUNITY): Payer: PPO

## 2017-09-18 ENCOUNTER — Ambulatory Visit (HOSPITAL_COMMUNITY): Payer: PPO

## 2017-09-19 DIAGNOSIS — I2510 Atherosclerotic heart disease of native coronary artery without angina pectoris: Secondary | ICD-10-CM | POA: Diagnosis not present

## 2017-09-19 DIAGNOSIS — I2583 Coronary atherosclerosis due to lipid rich plaque: Secondary | ICD-10-CM | POA: Diagnosis not present

## 2017-09-19 DIAGNOSIS — E7800 Pure hypercholesterolemia, unspecified: Secondary | ICD-10-CM | POA: Diagnosis not present

## 2017-09-19 LAB — LIPID PANEL W/O CHOL/HDL RATIO
CHOLESTEROL TOTAL: 160 mg/dL (ref 100–199)
HDL: 52 mg/dL (ref >39)
LDL CALC: 72 mg/dL (ref 0–99)
Triglycerides: 179 mg/dL — ABNORMAL HIGH (ref 0–149)
VLDL CHOLESTEROL CAL: 36 mg/dL (ref 5–40)

## 2017-09-19 LAB — HEPATIC FUNCTION PANEL
ALT: 61 IU/L — ABNORMAL HIGH (ref 0–44)
AST: 30 IU/L (ref 0–40)
Albumin: 4.8 g/dL (ref 3.6–4.8)
Alkaline Phosphatase: 40 IU/L (ref 39–117)
BILIRUBIN TOTAL: 0.5 mg/dL (ref 0.0–1.2)
BILIRUBIN, DIRECT: 0.15 mg/dL (ref 0.00–0.40)
TOTAL PROTEIN: 7.7 g/dL (ref 6.0–8.5)

## 2017-09-20 ENCOUNTER — Ambulatory Visit (HOSPITAL_COMMUNITY): Payer: PPO

## 2017-09-22 ENCOUNTER — Ambulatory Visit (HOSPITAL_COMMUNITY): Payer: PPO

## 2017-09-24 NOTE — Progress Notes (Signed)
HPI: FU CAD; history of acute lateral myocardial infarction 2004 followed by cardiac catheterization and PCI with bare-metal stents to the mid circumflex. Had PCI of circumflex in 2015 due to restenosis. Admitted with unstable angina December 2018. Cardiac catheterization showed a 70-80% RCA and patent stent in the circumflex. 60% OM1 and 40% OM 2. Patient had PCI of RCA with drug-eluting stent. Echocardiogram December 2018 showed normal LV systolic function. Since last seen,the patient has dyspnea with more extreme activities but not with routine activities. It is relieved with rest. It is not associated with chest pain. There is no orthopnea, PND or pedal edema. There is no syncope or palpitations. There is no exertional chest pain.   Current Outpatient Medications  Medication Sig Dispense Refill  . aspirin 81 MG tablet Take 81 mg by mouth daily.      . carvedilol (COREG) 12.5 MG tablet TAKE 1 TABLET TWICE DAILY WITH A MEAL 180 tablet 0  . Evolocumab (REPATHA SURECLICK) 884 MG/ML SOAJ Inject 1 pen into the skin every 14 (fourteen) days. 2 pen 11  . glimepiride (AMARYL) 1 MG tablet Take 2 tablets by mouth daily.     . irbesartan (AVAPRO) 150 MG tablet Take 1 tablet (150 mg total) by mouth daily. 90 tablet 3  . metFORMIN (GLUCOPHAGE) 1000 MG tablet Take 1 tablet (1,000 mg total) by mouth 2 (two) times daily with a meal. 180 tablet 3  . nitroGLYCERIN (NITROSTAT) 0.4 MG SL tablet Place 1 tablet (0.4 mg total) under the tongue every 5 (five) minutes as needed for chest pain. 25 tablet 3  . omega-3 acid ethyl esters (LOVAZA) 1 g capsule TAKE 2 CAPSULES TWICE DAILY 360 capsule 0  . pantoprazole (PROTONIX) 40 MG tablet Take 1 tablet (40 mg total) by mouth daily. 90 tablet 3  . tamsulosin (FLOMAX) 0.4 MG CAPS capsule Take 0.4 mg by mouth daily.    . ticagrelor (BRILINTA) 90 MG TABS tablet Take 1 tablet (90 mg total) by mouth 2 (two) times daily. KEEP OV. 180 tablet 0   No current  facility-administered medications for this visit.      Past Medical History:  Diagnosis Date  . Complication of anesthesia    "I'm slow to come out"  . Coronary artery disease 2015   PTCA with DES to Cx; PCI  BMS 2004  . GERD (gastroesophageal reflux disease)   . H/O hiatal hernia   . History of myocardial infarction    with a stent placed at that time  . Hypercholesterolemia    with intolerant ot many of the cholesterol medicaitons  . Hypertension    essentai hypertension  . Ischemic heart disease   . Myocardial infarction [facility]) 2004   with a stent placed at that time  . Nephrosis  1960's  . Sleep apnea    "suppose to wear mask; I don't wear it enough" (11/15/2013)  . Type II diabetes mellitus ([place])     Past Surgical History:  Procedure Laterality Date  . BACK SURGERY    . COLONOSCOPY  10/07/2011   [name]:multiple colonic polyps removed/colonic diverticulosis, tubular adenoma  . COLONOSCOPY N/A 12/08/2014   Procedure: COLONOSCOPY;  Surgeon: [name], MD;  [facility];  Service: Endoscopy;  Laterality: N/A;  930  . CORONARY ANGIOPLASTY WITH STENT PLACEMENT  09/25/2002   EF-approximately 45% / stent placement to the mid arteriovenous circumflex with reduction of 99% narrowing to 0% with placement of a 3.0  x 16 mm Express II stent with improvement of TIMI grade 2 to TIMI grade 3 flow.  . CORONARY ANGIOPLASTY WITH STENT PLACEMENT  11/15/2013   "1"  . CORONARY STENT INTERVENTION N/A 07/03/2017   Procedure: CORONARY STENT INTERVENTION;  Surgeon: Nelva Bush, MD;  Location: [place] CV LAB;  Service: Cardiovascular;  Laterality: N/A;  . LEFT HEART CATHETERIZATION WITH CORONARY ANGIOGRAM N/A 11/15/2013   Procedure: LEFT HEART CATHETERIZATION WITH CORONARY ANGIOGRAM;  Surgeon: Burnell Blanks, MD;  Location: [facility] CATH LAB;  Service: Cardiovascular;  Laterality: N/A;  . LUMBAR [place]  . NASAL SEPTUM SURGERY  02/20/2001   Septal  reconstruction and turbinate reduction  . RIGHT/LEFT HEART CATH AND CORONARY ANGIOGRAPHY N/A 07/03/2017   Procedure: RIGHT/LEFT HEART CATH AND CORONARY ANGIOGRAPHY;  Surgeon: Nelva Bush, MD;  Location: [place] CV LAB;  Service: Cardiovascular;  Laterality: N/A;    Social History   Socioeconomic History  . Marital status: Married    Spouse name: Not on file  . Number of children: Not on file  . Years of education: Not on file  . Highest education level: Not on file  Social Needs  . Financial resource strain: Not on file  . Food insecurity - worry: Not on file  . Food insecurity - inability: Not on file  . Transportation needs - medical: Not on file  . Transportation needs - non-medical: Not on file  Occupational History  . Not on file  Tobacco Use  . Smoking status: Never Smoker  . Smokeless tobacco: Never Used  . Tobacco comment: Never smoker  Substance and Sexual Activity  . Alcohol use: No    Alcohol/week: 0.0 oz  . Drug use: No  . Sexual activity: Yes  Other Topics Concern  . Not on file  Social History Narrative  . Not on file    Family History  Problem Relation Age of Onset  . Coronary artery disease Father        with multiple angioplasties  . Healthy Sister   . Hypertension Unknown        runs in the family  . Colon cancer Neg Hx     ROS: fatigue but no fevers or chills, productive cough, hemoptysis, dysphasia, odynophagia, melena, hematochezia, dysuria, hematuria, rash, seizure activity, orthopnea, PND, pedal edema, claudication. Remaining systems are negative.  Physical Exam: Well-developed well-nourished in no acute distress.  Skin is warm and dry.  HEENT is normal.  Neck is supple.  Chest is clear to auscultation with normal expansion.  Cardiovascular exam is regular rate and rhythm.  Abdominal exam nontender or distended. No masses palpated. Extremities show no edema. neuro grossly intact   A/P  1 coronary artery disease-patient  doing well with no chest pain.  Plan to continue medical therapy including aspirin, Brilinta and statin.  Plan discontinue Brilinta 1 year following recent PCI.  2 hypertension-blood pressure is controlled.  Continue present medications.  3 hyperlipidemia-continue Repatha.  [name], MD

## 2017-09-25 ENCOUNTER — Ambulatory Visit (HOSPITAL_COMMUNITY): Payer: PPO

## 2017-09-27 ENCOUNTER — Ambulatory Visit (HOSPITAL_COMMUNITY): Payer: PPO

## 2017-09-29 ENCOUNTER — Ambulatory Visit (INDEPENDENT_AMBULATORY_CARE_PROVIDER_SITE_OTHER): Payer: PPO | Admitting: Cardiology

## 2017-09-29 ENCOUNTER — Ambulatory Visit (HOSPITAL_COMMUNITY): Payer: PPO

## 2017-09-29 ENCOUNTER — Encounter: Payer: Self-pay | Admitting: Cardiology

## 2017-09-29 VITALS — BP 110/70 | HR 74 | Ht 69.0 in | Wt 171.0 lb

## 2017-09-29 DIAGNOSIS — I10 Essential (primary) hypertension: Secondary | ICD-10-CM

## 2017-09-29 DIAGNOSIS — E7800 Pure hypercholesterolemia, unspecified: Secondary | ICD-10-CM

## 2017-09-29 DIAGNOSIS — I2510 Atherosclerotic heart disease of native coronary artery without angina pectoris: Secondary | ICD-10-CM | POA: Diagnosis not present

## 2017-09-29 NOTE — Patient Instructions (Signed)
Medication Instructions:  Your physician recommends that you continue on your current medications as directed. Please refer to the Current Medication list given to you today.   Labwork: none  Testing/Procedures: none  Follow-Up: Your physician wants you to follow-up in: 12 months with Dr. Stanford Breed. You will receive a reminder letter in the mail two months in advance. If you don't receive a letter, please call our office to schedule the follow-up appointment.   Any Other Special Instructions Will Be Listed Below (If Applicable).     If you need a refill on your cardiac medications before your next appointment, please call your pharmacy.

## 2017-10-02 ENCOUNTER — Ambulatory Visit (HOSPITAL_COMMUNITY): Payer: PPO

## 2017-10-04 ENCOUNTER — Ambulatory Visit (HOSPITAL_COMMUNITY): Payer: PPO

## 2017-10-06 ENCOUNTER — Ambulatory Visit (HOSPITAL_COMMUNITY): Payer: PPO

## 2017-10-09 ENCOUNTER — Ambulatory Visit (HOSPITAL_COMMUNITY): Payer: PPO

## 2017-10-11 ENCOUNTER — Ambulatory Visit (HOSPITAL_COMMUNITY): Payer: PPO

## 2017-10-13 ENCOUNTER — Ambulatory Visit (HOSPITAL_COMMUNITY): Payer: PPO

## 2017-10-16 ENCOUNTER — Ambulatory Visit (HOSPITAL_COMMUNITY): Payer: PPO

## 2017-10-18 ENCOUNTER — Ambulatory Visit (HOSPITAL_COMMUNITY): Payer: PPO

## 2017-10-20 ENCOUNTER — Ambulatory Visit (HOSPITAL_COMMUNITY): Payer: PPO

## 2017-10-20 DIAGNOSIS — N400 Enlarged prostate without lower urinary tract symptoms: Secondary | ICD-10-CM | POA: Diagnosis not present

## 2017-10-20 DIAGNOSIS — I10 Essential (primary) hypertension: Secondary | ICD-10-CM | POA: Diagnosis not present

## 2017-10-20 DIAGNOSIS — Z6824 Body mass index (BMI) 24.0-24.9, adult: Secondary | ICD-10-CM | POA: Diagnosis not present

## 2017-10-20 DIAGNOSIS — K219 Gastro-esophageal reflux disease without esophagitis: Secondary | ICD-10-CM | POA: Diagnosis not present

## 2017-10-20 DIAGNOSIS — J329 Chronic sinusitis, unspecified: Secondary | ICD-10-CM | POA: Diagnosis not present

## 2017-10-20 DIAGNOSIS — E119 Type 2 diabetes mellitus without complications: Secondary | ICD-10-CM | POA: Diagnosis not present

## 2017-10-23 ENCOUNTER — Ambulatory Visit (HOSPITAL_COMMUNITY): Payer: PPO

## 2017-10-25 ENCOUNTER — Ambulatory Visit (HOSPITAL_COMMUNITY): Payer: PPO

## 2017-10-27 ENCOUNTER — Ambulatory Visit (HOSPITAL_COMMUNITY): Payer: PPO

## 2017-10-30 ENCOUNTER — Ambulatory Visit (HOSPITAL_COMMUNITY): Payer: PPO

## 2017-11-01 ENCOUNTER — Ambulatory Visit (HOSPITAL_COMMUNITY): Payer: PPO

## 2017-11-03 ENCOUNTER — Ambulatory Visit (HOSPITAL_COMMUNITY): Payer: PPO

## 2017-11-06 ENCOUNTER — Ambulatory Visit (HOSPITAL_COMMUNITY): Payer: PPO

## 2017-11-08 ENCOUNTER — Ambulatory Visit (HOSPITAL_COMMUNITY): Payer: PPO

## 2017-11-10 ENCOUNTER — Ambulatory Visit (HOSPITAL_COMMUNITY): Payer: PPO

## 2017-11-13 ENCOUNTER — Ambulatory Visit (HOSPITAL_COMMUNITY): Payer: PPO

## 2017-11-15 ENCOUNTER — Ambulatory Visit (HOSPITAL_COMMUNITY): Payer: PPO

## 2017-11-17 ENCOUNTER — Ambulatory Visit (HOSPITAL_COMMUNITY): Payer: PPO

## 2018-01-15 DIAGNOSIS — D2261 Melanocytic nevi of right upper limb, including shoulder: Secondary | ICD-10-CM | POA: Diagnosis not present

## 2018-01-15 DIAGNOSIS — D225 Melanocytic nevi of trunk: Secondary | ICD-10-CM | POA: Diagnosis not present

## 2018-01-15 DIAGNOSIS — L218 Other seborrheic dermatitis: Secondary | ICD-10-CM | POA: Diagnosis not present

## 2018-01-15 DIAGNOSIS — L812 Freckles: Secondary | ICD-10-CM | POA: Diagnosis not present

## 2018-01-15 DIAGNOSIS — D2272 Melanocytic nevi of left lower limb, including hip: Secondary | ICD-10-CM | POA: Diagnosis not present

## 2018-01-15 DIAGNOSIS — D1801 Hemangioma of skin and subcutaneous tissue: Secondary | ICD-10-CM | POA: Diagnosis not present

## 2018-01-15 DIAGNOSIS — D2262 Melanocytic nevi of left upper limb, including shoulder: Secondary | ICD-10-CM | POA: Diagnosis not present

## 2018-01-15 DIAGNOSIS — L821 Other seborrheic keratosis: Secondary | ICD-10-CM | POA: Diagnosis not present

## 2018-02-03 ENCOUNTER — Other Ambulatory Visit: Payer: Self-pay | Admitting: Cardiology

## 2018-02-05 NOTE — Telephone Encounter (Signed)
Rx sent to pharmacy   

## 2018-02-07 ENCOUNTER — Other Ambulatory Visit: Payer: Self-pay | Admitting: Cardiology

## 2018-02-16 DIAGNOSIS — E782 Mixed hyperlipidemia: Secondary | ICD-10-CM | POA: Diagnosis not present

## 2018-02-16 DIAGNOSIS — I2510 Atherosclerotic heart disease of native coronary artery without angina pectoris: Secondary | ICD-10-CM | POA: Diagnosis not present

## 2018-02-16 DIAGNOSIS — N401 Enlarged prostate with lower urinary tract symptoms: Secondary | ICD-10-CM | POA: Diagnosis not present

## 2018-02-16 DIAGNOSIS — I10 Essential (primary) hypertension: Secondary | ICD-10-CM | POA: Diagnosis not present

## 2018-02-16 DIAGNOSIS — Z6824 Body mass index (BMI) 24.0-24.9, adult: Secondary | ICD-10-CM | POA: Diagnosis not present

## 2018-02-16 DIAGNOSIS — K219 Gastro-esophageal reflux disease without esophagitis: Secondary | ICD-10-CM | POA: Diagnosis not present

## 2018-02-16 DIAGNOSIS — E119 Type 2 diabetes mellitus without complications: Secondary | ICD-10-CM | POA: Diagnosis not present

## 2018-02-16 DIAGNOSIS — Z1389 Encounter for screening for other disorder: Secondary | ICD-10-CM | POA: Diagnosis not present

## 2018-02-26 ENCOUNTER — Telehealth: Payer: Self-pay | Admitting: Cardiology

## 2018-02-26 MED ORDER — CLOPIDOGREL BISULFATE 75 MG PO TABS: 75.0000 mg | ORAL_TABLET | Freq: Every day | ORAL | 3 refills | Status: DC

## 2018-02-26 MED ORDER — LOSARTAN POTASSIUM 50 MG PO TABS: 50.0000 mg | ORAL_TABLET | Freq: Every day | ORAL | 3 refills | Status: DC

## 2018-02-26 NOTE — Telephone Encounter (Signed)
Spoke with pt who states Irbesartan is currently on back order and needing an alternative medication. Pt also states his last refill for Brilinta was only $90 for a 90 days supply but has recently increased to  $262. Pt requesting if there's a medication he can take that's a little more cost efficient but if not he will continue to pay for it. Routing to Dr. Stanford Breed for recommendation.

## 2018-02-26 NOTE — Telephone Encounter (Signed)
Spoke with pt, Aware of dr crenshaw's recommendations. New script sent to the pharmacy  

## 2018-02-26 NOTE — Telephone Encounter (Signed)
Change irbesartan to cozaar 50 mg daily; dc brilinta and begin plavix 75 mg daily [name]

## 2018-02-26 NOTE — Telephone Encounter (Signed)
New message   Pt c/o medication issue:  1. Name of Medication: irbesartan (AVAPRO) 150 MG tablet  2. How are you currently taking this medication (dosage and times per day)? TAKE 1 TABLET BY MOUTH EVERY DAY  3. Are you having a reaction (difficulty breathing--STAT)? no  4. What is your medication issue? The medicine is on backorder and the pharmacy can't get it refilled.      Pt c/o medication issue:  1. Name of Medication: ticagrelor (BRILINTA) 90 MG TABS tablet  2. How are you currently taking this medication (dosage and times per day)? Take 1 tablet (90 mg total) by mouth 2 (two) times daily. KEEP OV.  3. Are you having a reaction (difficulty breathing--STAT)? no  4. What is your medication issue?  Patient states that price has increased on this medication to $262.00. Patient states he would like to see what his options are for another medication that would be cheaper.

## 2018-03-12 DIAGNOSIS — R972 Elevated prostate specific antigen [PSA]: Secondary | ICD-10-CM | POA: Diagnosis not present

## 2018-03-16 DIAGNOSIS — R351 Nocturia: Secondary | ICD-10-CM | POA: Diagnosis not present

## 2018-03-16 DIAGNOSIS — N401 Enlarged prostate with lower urinary tract symptoms: Secondary | ICD-10-CM | POA: Diagnosis not present

## 2018-03-16 DIAGNOSIS — N4232 Atypical small acinar proliferation of prostate: Secondary | ICD-10-CM | POA: Diagnosis not present

## 2018-03-16 DIAGNOSIS — N5201 Erectile dysfunction due to arterial insufficiency: Secondary | ICD-10-CM | POA: Diagnosis not present

## 2018-03-16 DIAGNOSIS — R972 Elevated prostate specific antigen [PSA]: Secondary | ICD-10-CM | POA: Diagnosis not present

## 2018-03-16 DIAGNOSIS — R3912 Poor urinary stream: Secondary | ICD-10-CM | POA: Diagnosis not present

## 2018-03-18 ENCOUNTER — Other Ambulatory Visit: Payer: Self-pay | Admitting: Cardiology

## 2018-04-30 ENCOUNTER — Other Ambulatory Visit: Payer: Self-pay | Admitting: Cardiology

## 2018-05-28 DIAGNOSIS — I10 Essential (primary) hypertension: Secondary | ICD-10-CM | POA: Diagnosis not present

## 2018-05-28 DIAGNOSIS — Z0001 Encounter for general adult medical examination with abnormal findings: Secondary | ICD-10-CM | POA: Diagnosis not present

## 2018-05-28 DIAGNOSIS — E119 Type 2 diabetes mellitus without complications: Secondary | ICD-10-CM | POA: Diagnosis not present

## 2018-05-28 DIAGNOSIS — N400 Enlarged prostate without lower urinary tract symptoms: Secondary | ICD-10-CM | POA: Diagnosis not present

## 2018-05-28 DIAGNOSIS — K219 Gastro-esophageal reflux disease without esophagitis: Secondary | ICD-10-CM | POA: Diagnosis not present

## 2018-05-28 DIAGNOSIS — Z6824 Body mass index (BMI) 24.0-24.9, adult: Secondary | ICD-10-CM | POA: Diagnosis not present

## 2018-05-28 DIAGNOSIS — E782 Mixed hyperlipidemia: Secondary | ICD-10-CM | POA: Diagnosis not present

## 2018-06-18 ENCOUNTER — Telehealth: Payer: Self-pay | Admitting: Cardiology

## 2018-06-18 DIAGNOSIS — I2510 Atherosclerotic heart disease of native coronary artery without angina pectoris: Secondary | ICD-10-CM

## 2018-06-18 NOTE — Telephone Encounter (Signed)
°*  STAT* If patient is at the pharmacy, call can be transferred to refill team.   1. Which medications need to be refilled? (please list name of each medication and dose if known)  Evolocumab (REPATHA SURECLICK) 301 MG/ML SOAJ    2. Which pharmacy/location (including street and city if local pharmacy) is medication to be sent to?  [facility] Phone [phone] Fax [phone]   3. Do they need a 30 day or 90 day supply?

## 2018-06-18 NOTE — Telephone Encounter (Signed)
Pt returned call. He will come in Dec 16th for fasting lab work. He is changing to [employer] on 07/25/18. Will need updated insurance info and new prior authorization before we can submit Safety Net Application for next year.

## 2018-06-18 NOTE — Telephone Encounter (Addendum)
LMOM. Pt will need Safety Net Application filled out for 2020 year and will require an updated lipid panel before this can be completed.

## 2018-07-09 ENCOUNTER — Other Ambulatory Visit: Payer: PPO | Admitting: *Deleted

## 2018-07-09 DIAGNOSIS — I2510 Atherosclerotic heart disease of native coronary artery without angina pectoris: Secondary | ICD-10-CM | POA: Diagnosis not present

## 2018-07-09 LAB — HEPATIC FUNCTION PANEL
ALBUMIN: 4.7 g/dL (ref 3.6–4.8)
ALK PHOS: 39 IU/L (ref 39–117)
ALT: 32 IU/L (ref 0–44)
AST: 19 IU/L (ref 0–40)
BILIRUBIN, DIRECT: 0.11 mg/dL (ref 0.00–0.40)
Bilirubin Total: 0.3 mg/dL (ref 0.0–1.2)
TOTAL PROTEIN: 7.1 g/dL (ref 6.0–8.5)

## 2018-07-09 LAB — LIPID PANEL
Chol/HDL Ratio: 3.2 ratio (ref 0.0–5.0)
Cholesterol, Total: 150 mg/dL (ref 100–199)
HDL: 47 mg/dL (ref >39)
LDL Calculated: 76 mg/dL (ref 0–99)
Triglycerides: 135 mg/dL (ref 0–149)
VLDL CHOLESTEROL CAL: 27 mg/dL (ref 5–40)

## 2018-07-31 NOTE — Telephone Encounter (Signed)
Prior authorization submitted for Repatha, however his new [employer] only has Praluent on formulary. Will submit PA request for equivalent dose of Praluent 150mg /mL. Pt is aware of medication change.

## 2018-08-17 ENCOUNTER — Telehealth: Payer: Self-pay

## 2018-08-17 NOTE — Telephone Encounter (Signed)
Called pt to let them know they are missing the proof of income on their pass info pt notified me that they had received the letter to get that fixed and that they plan to send in the completed forms to [employer] themselves and wait for determination

## 2018-09-03 DIAGNOSIS — R69 Illness, unspecified: Secondary | ICD-10-CM | POA: Diagnosis not present

## 2018-09-12 ENCOUNTER — Encounter: Payer: Self-pay | Admitting: Cardiology

## 2018-09-21 ENCOUNTER — Telehealth: Payer: Self-pay

## 2018-09-21 NOTE — Telephone Encounter (Signed)
I called to let pt know that [employer] hasn't received any proof of income info. I gave the pt the fax # and the phone number to call [employer] as well. I also let him know that if he had any questions that he could call us back at the office

## 2018-09-26 NOTE — Progress Notes (Signed)
HPI: FU CAD; history of acute lateral myocardial infarction 2004 followed by cardiac catheterization and PCI with bare-metal stents to the mid circumflex.Had PCI of circumflex in 2015 due to restenosis. Admitted with unstable angina December 2018. Cardiac catheterization showed a 70-80% RCA and patent stent in the circumflex. 60% OM1 and 40% OM 2. Patient had PCI of RCA with drug-eluting stent. Echocardiogram December 2018 showed normal LV systolic function.Since last seen,the patient has dyspnea with more extreme activities but not with routine activities. It is relieved with rest. It is not associated with chest pain. There is no orthopnea, PND or pedal edema. There is no syncope or palpitations. There is no exertional chest pain.   Current Outpatient Medications  Medication Sig Dispense Refill  . aspirin 81 MG tablet Take 81 mg by mouth daily.      . carvedilol (COREG) 12.5 MG tablet TAKE 1 TABLET TWICE DAILY WITH A MEAL 180 tablet 4  . clopidogrel (PLAVIX) 75 MG tablet Take 1 tablet (75 mg total) by mouth daily. 90 tablet 3  . Evolocumab (REPATHA SURECLICK) 716 MG/ML SOAJ Inject 1 pen into the skin every 14 (fourteen) days. 2 pen 11  . glimepiride (AMARYL) 1 MG tablet Take 2 tablets by mouth daily.     Marland Kitchen losartan (COZAAR) 50 MG tablet Take 1 tablet (50 mg total) by mouth daily. 90 tablet 3  . metFORMIN (GLUCOPHAGE) 1000 MG tablet Take 1 tablet (1,000 mg total) by mouth 2 (two) times daily with a meal. 180 tablet 3  . nitroGLYCERIN (NITROSTAT) 0.4 MG SL tablet Place 1 tablet (0.4 mg total) under the tongue every 5 (five) minutes as needed for chest pain. 25 tablet 3  . omega-3 acid ethyl esters (LOVAZA) 1 g capsule TAKE 2 CAPSULES BY MOUTH TWICE A DAY 360 capsule 0  . pantoprazole (PROTONIX) 40 MG tablet Take 1 tablet (40 mg total) by mouth daily. 90 tablet 3  . tamsulosin (FLOMAX) 0.4 MG CAPS capsule Take 0.4 mg by mouth daily.     No current facility-administered medications for  this visit.      Past Medical History:  Diagnosis Date  . Complication of anesthesia    "I'm slow to come out"  . Coronary artery disease 2015   PTCA with DES to Cx; PCI  BMS 2004  . GERD (gastroesophageal reflux disease)   . H/O hiatal hernia   . History of myocardial infarction    with a stent placed at that time  . Hypercholesterolemia    with intolerant ot many of the cholesterol medicaitons  . Hypertension    essentai hypertension  . Ischemic heart disease   . Myocardial infarction [facility]) 2004   with a stent placed at that time  . Nephrosis  1960's  . Sleep apnea    "suppose to wear mask; I don't wear it enough" (11/15/2013)  . Type II diabetes mellitus ([place])     Past Surgical History:  Procedure Laterality Date  . BACK SURGERY    . COLONOSCOPY  10/07/2011   [name]:multiple colonic polyps removed/colonic diverticulosis, tubular adenoma  . COLONOSCOPY N/A 12/08/2014   Procedure: COLONOSCOPY;  Surgeon: [name], MD;  Location: [facility];  Service: Endoscopy;  Laterality: N/A;  930  . CORONARY ANGIOPLASTY WITH STENT PLACEMENT  09/25/2002   EF-approximately 45% / stent placement to the mid arteriovenous circumflex with reduction of 99% narrowing to 0% with placement of a 3.0 x 16 mm Express II  stent with improvement of TIMI grade 2 to TIMI grade 3 flow.  . CORONARY ANGIOPLASTY WITH STENT PLACEMENT  11/15/2013   "1"  . CORONARY STENT INTERVENTION N/A 07/03/2017   Procedure: CORONARY STENT INTERVENTION;  Surgeon: Nelva Bush, MD;  Location: [place] [facility];  Service: Cardiovascular;  Laterality: N/A;  . LEFT HEART CATHETERIZATION WITH CORONARY ANGIOGRAM N/A 11/15/2013   Procedure: LEFT HEART CATHETERIZATION WITH CORONARY ANGIOGRAM;  Surgeon: Burnell Blanks, MD;  Location: [facility] CATH LAB;  Service: Cardiovascular;  Laterality: N/A;  . LUMBAR [place]  . NASAL SEPTUM SURGERY  02/20/2001   Septal reconstruction and turbinate reduction  .  RIGHT/LEFT HEART CATH AND CORONARY ANGIOGRAPHY N/A 07/03/2017   Procedure: RIGHT/LEFT HEART CATH AND CORONARY ANGIOGRAPHY;  Surgeon: Nelva Bush, MD;  Location: [place] [facility] LAB;  Service: Cardiovascular;  Laterality: N/A;    Social History   Socioeconomic History  . Marital status: Married    Spouse name: Not on file  . Number of children: Not on file  . Years of education: Not on file  . Highest education level: Not on file  Occupational History  . Not on file  Social Needs  . Financial resource strain: Not on file  . Food insecurity:    Worry: Not on file    Inability: Not on file  . Transportation needs:    Medical: Not on file    Non-medical: Not on file  Tobacco Use  . Smoking status: Never Smoker  . Smokeless tobacco: Never Used  . Tobacco comment: Never smoker  Substance and Sexual Activity  . Alcohol use: No    Alcohol/week: 0.0 standard drinks  . Drug use: No  . Sexual activity: Yes  Lifestyle  . Physical activity:    Days per week: Not on file    Minutes per session: Not on file  . Stress: Not on file  Relationships  . Social connections:    Talks on phone: Not on file    Gets together: Not on file    Attends religious service: Not on file    Active member of club or organization: Not on file    Attends meetings of clubs or organizations: Not on file    Relationship status: Not on file  . Intimate partner violence:    Fear of current or ex partner: Not on file    Emotionally abused: Not on file    Physically abused: Not on file    Forced sexual activity: Not on file  Other Topics Concern  . Not on file  Social History Narrative  . Not on file    Family History  Problem Relation Age of Onset  . Coronary artery disease Father        with multiple angioplasties  . Healthy Sister   . Hypertension Other        runs in the family  . Colon cancer Neg Hx     ROS: no fevers or chills, productive cough, hemoptysis, dysphasia, odynophagia,  melena, hematochezia, dysuria, hematuria, rash, seizure activity, orthopnea, PND, pedal edema, claudication. Remaining systems are negative.  Physical Exam: Well-developed well-nourished in no acute distress.  Skin is warm and dry.  HEENT is normal.  Neck is supple.  Chest is clear to auscultation with normal expansion.  Cardiovascular exam is regular rate and rhythm.  Abdominal exam nontender or distended. No masses palpated. Extremities show no edema. neuro grossly intact  ECG-sinus rhythm at a rate of 71, no  ST changes.  Personally reviewed  A/P  1 coronary artery disease-patient doing well with no chest pain.  Plan to continue aspirin.  Discontinue plavix as it has been greater than 1 year since previous PCI.  Intolerant to statins.  2 hypertension-patient's blood pressure is elevated; however he states typically controlled.  Continue present medications and follow.  Note he is having trouble obtaining his losartan.  We will change to valsartan 160 mg daily.  3 hyperlipidemia-intolerant to statins.  Continue Repatha.  [name], MD

## 2018-09-28 IMAGING — MR MR PROSTATE WO/W CM
56 series · 56 of 56 positions shown · IV contrast (Multihance 17ml)
Comparison: Prior biopsy of 03/09/2017.

CLINICAL DATA: Elevated PSA.

EXAM:
MR PROSTATE WITHOUT AND WITH CONTRAST
TECHNIQUE: Multiplanar multisequence MRI images were obtained of the pelvis
centered about the prostate. Pre and post contrast images were
obtained.
CONTRAST:  17mL MULTIHANCE GADOBENATE DIMEGLUMINE 529 MG/ML IV SOLN
Creatinine was obtained on site at [HOSPITAL] at [HOSPITAL].
Results: Creatinine 0.9 mg/dL.

[Series 3: T1 · axial · 8.0mm · 1.06mm/px · 1 of 28 slices shown (1 of 2)]
[im 1/28]
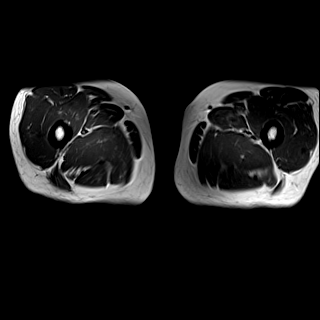

[Series 4: bSSFP fat-sat · axial · 8.0mm · 0.74mm/px · 1 of 28 slices shown]
[im 1/28]
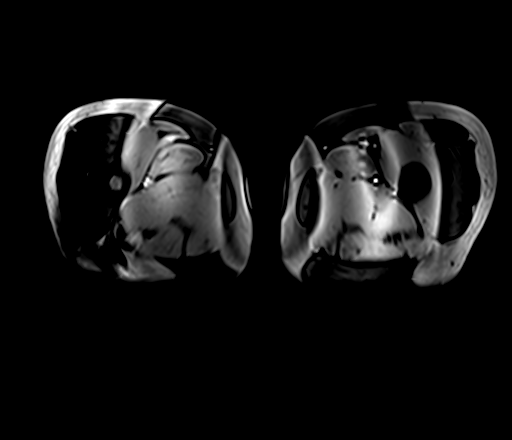

[Series 5: T2 · sagittal · 3.5mm · 0.56mm/px · 1 of 39 slices shown (1 of 4)]
[im 1/39]
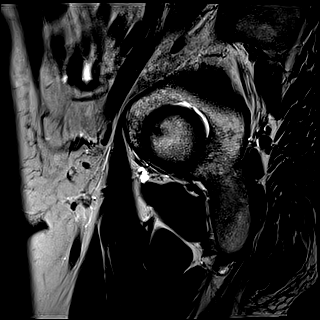

[Series 6: T1 · axial · 3.0mm · 0.31mm/px · 1 of 24 slices shown (2 of 2)]
[im 1/24]
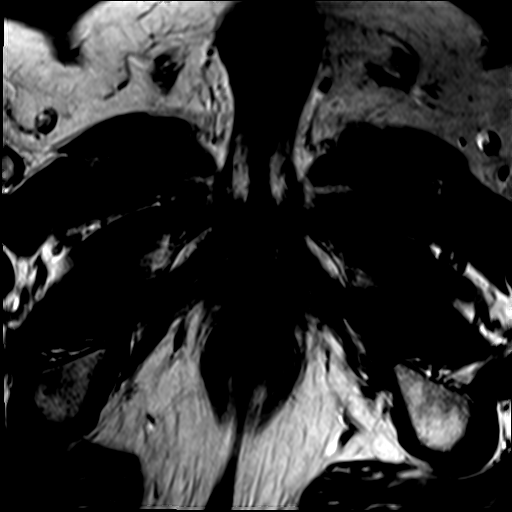

[Series 7: T2 · axial · 3.5mm · 0.56mm/px · 1 of 23 slices shown (2 of 4)]
[im 1/23]
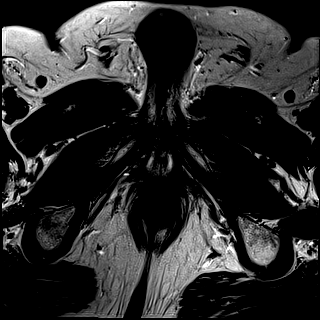

[Series 8: T2 · axial · 1.0mm · 1.04mm/px · 1 of 80 slices shown (3 of 4)]
[im 1/80]
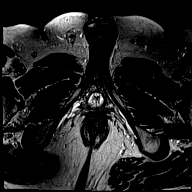

[Series 9: T2 · coronal · 3.5mm · 0.56mm/px · 1 of 23 slices shown (4 of 4)]
[im 1/23]
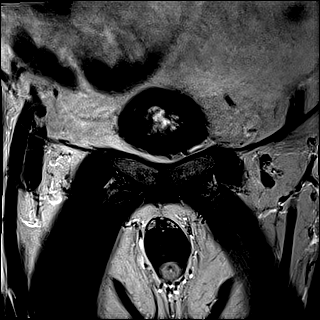

[Series 10: DWI · axial · 3.5mm · 1.56mm/px · 1 of 60 slices shown (1 of 2)]
[im 1/60]
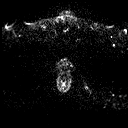

[Series 11: DWI · axial · 3.5mm · 1.56mm/px · 1 of 20 slices shown (2 of 2)]
[im 1/20]
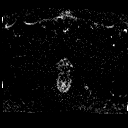

[Series 12: pre t1_twist_tra_dyn_ttc=5.3s · axial · non-contrast · 3.5mm · 0.83mm/px · 1 of 20 slices shown]
[im 1/20]
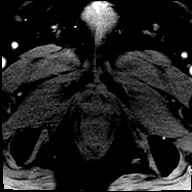

[Series 13: post t1_twist_tra_dyn-copy center · axial · 3.5mm · 0.83mm/px · 1 of 20 slices shown (1 of 24)]
[im 1/20]
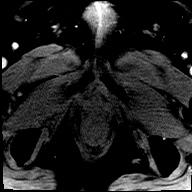

[Series 14: post t1_twist_tra_dyn-copy center · axial · 3.5mm · 0.83mm/px · 1 of 20 slices shown (2 of 24)]
[im 1/20]
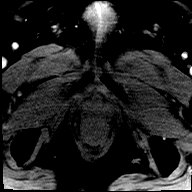

[Series 15: post t1_twist_tra_dyn-copy cent_sub_ttc=23.0s · axial · 3.5mm · 0.83mm/px · 1 of 20 slices shown]
[im 1/20]
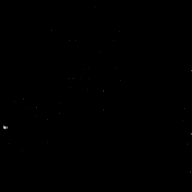

[Series 16: post t1_twist_tra_dyn-copy center · axial · 3.5mm · 0.83mm/px · 1 of 20 slices shown (3 of 24)]
[im 1/20]
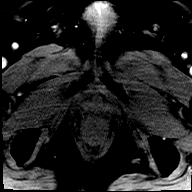

[Series 17: post t1_twist_tra_dyn-copy cent_sub_ttc=33.2s · axial · 3.5mm · 0.83mm/px · 1 of 20 slices shown]
[im 1/20]
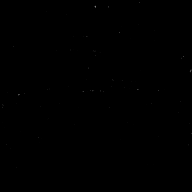

[Series 18: post t1_twist_tra_dyn-copy center · axial · 3.5mm · 0.83mm/px · 1 of 20 slices shown (4 of 24)]
[im 1/20]
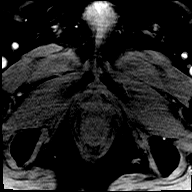

[Series 19: post t1_twist_tra_dyn-copy cent_sub_ttc=43.4s · axial · 3.5mm · 0.83mm/px · 1 of 20 slices shown]
[im 1/20]
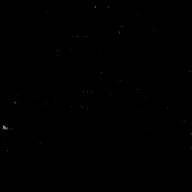

[Series 20: post t1_twist_tra_dyn-copy center · axial · 3.5mm · 0.83mm/px · 1 of 20 slices shown (5 of 24)]
[im 1/20]
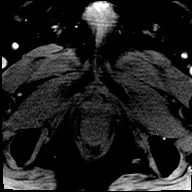

[Series 21: post t1_twist_tra_dyn-copy cent_sub_ttc=53.6s · axial · 3.5mm · 0.83mm/px · 1 of 19 slices shown]
[im 1/19]
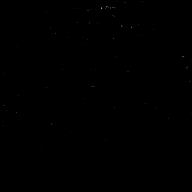

[Series 22: post t1_twist_tra_dyn-copy center · axial · 3.5mm · 0.83mm/px · 1 of 20 slices shown (6 of 24)]
[im 1/20]
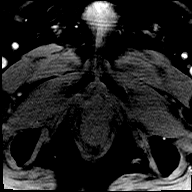

[Series 23: post t1_twist_tra_dyn-copy cent_sub_ttc=63.8s · axial · 3.5mm · 0.83mm/px · 1 of 18 slices shown]
[im 1/18]
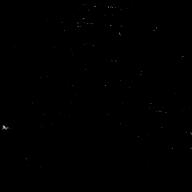

[Series 24: post t1_twist_tra_dyn-copy center · axial · 3.5mm · 0.83mm/px · 1 of 20 slices shown (7 of 24)]
[im 1/20]
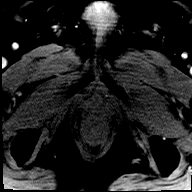

[Series 25: post t1_twist_tra_dyn-copy cent_sub_ttc=74.0s · axial · 3.5mm · 0.83mm/px · 1 of 20 slices shown]
[im 1/20]
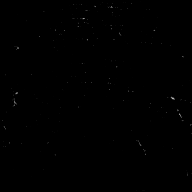

[Series 26: post t1_twist_tra_dyn-copy center · axial · 3.5mm · 0.83mm/px · 1 of 20 slices shown (8 of 24)]
[im 1/20]
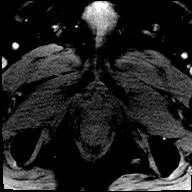

[Series 27: post t1_twist_tra_dyn-copy cent_sub_ttc=84.2s · axial · 3.5mm · 0.83mm/px · 1 of 20 slices shown]
[im 1/20]
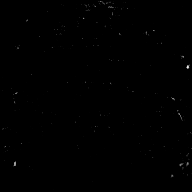

[Series 28: post t1_twist_tra_dyn-copy center · axial · 3.5mm · 0.83mm/px · 1 of 20 slices shown (9 of 24)]
[im 1/20]
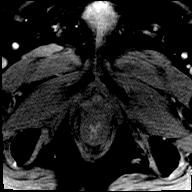

[Series 29: post t1_twist_tra_dyn-copy cent_sub_ttc=94.4s · axial · 3.5mm · 0.83mm/px · 1 of 20 slices shown]
[im 1/20]
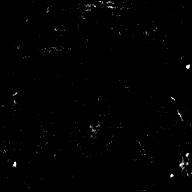

[Series 30: post t1_twist_tra_dyn-copy center · axial · 3.5mm · 0.83mm/px · 1 of 20 slices shown (10 of 24)]
[im 1/20]
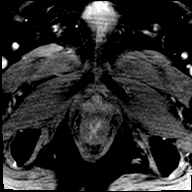

[Series 31: post t1_twist_tra_dyn-copy cent_sub_ttc=104.7s · axial · 3.5mm · 0.83mm/px · 1 of 20 slices shown]
[im 1/20]
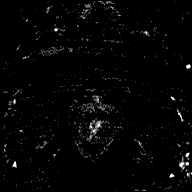

[Series 32: post t1_twist_tra_dyn-copy center · axial · 3.5mm · 0.83mm/px · 1 of 20 slices shown (11 of 24)]
[im 1/20]
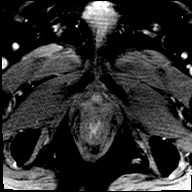

[Series 33: post t1_twist_tra_dyn-copy cent_sub_ttc=114.9s · axial · 3.5mm · 0.83mm/px · 1 of 20 slices shown]
[im 1/20]
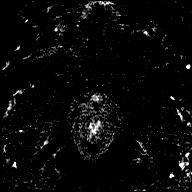

[Series 34: post t1_twist_tra_dyn-copy center · axial · 3.5mm · 0.83mm/px · 1 of 20 slices shown (12 of 24)]
[im 1/20]
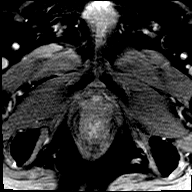

[Series 35: post t1_twist_tra_dyn-copy cent_sub_ttc=125.1s · axial · 3.5mm · 0.83mm/px · 1 of 20 slices shown]
[im 1/20]
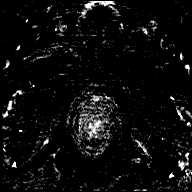

[Series 36: post t1_twist_tra_dyn-copy center · axial · 3.5mm · 0.83mm/px · 1 of 20 slices shown (13 of 24)]
[im 1/20]
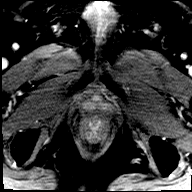

[Series 37: post t1_twist_tra_dyn-copy cent_sub_ttc=135.3s · axial · 3.5mm · 0.83mm/px · 1 of 20 slices shown]
[im 1/20]
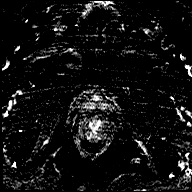

[Series 38: post t1_twist_tra_dyn-copy center · axial · 3.5mm · 0.83mm/px · 1 of 20 slices shown (14 of 24)]
[im 1/20]
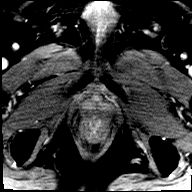

[Series 39: post t1_twist_tra_dyn-copy cent_sub_ttc=145.5s · axial · 3.5mm · 0.83mm/px · 1 of 20 slices shown]
[im 1/20]
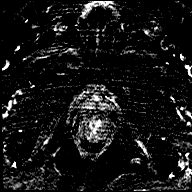

[Series 40: post t1_twist_tra_dyn-copy center · axial · 3.5mm · 0.83mm/px · 1 of 20 slices shown (15 of 24)]
[im 1/20]
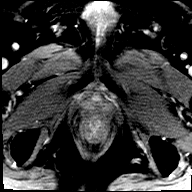

[Series 41: post t1_twist_tra_dyn-copy cent_sub_ttc=155.7s · axial · 3.5mm · 0.83mm/px · 1 of 20 slices shown]
[im 1/20]
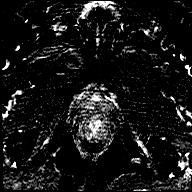

[Series 42: post t1_twist_tra_dyn-copy center · axial · 3.5mm · 0.83mm/px · 1 of 20 slices shown (16 of 24)]
[im 1/20]
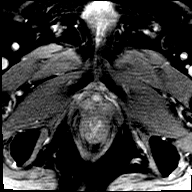

[Series 43: post t1_twist_tra_dyn-copy cent_sub_ttc=165.9s · axial · 3.5mm · 0.83mm/px · 1 of 20 slices shown]
[im 1/20]
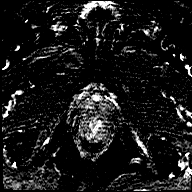

[Series 44: post t1_twist_tra_dyn-copy center · axial · 3.5mm · 0.83mm/px · 1 of 20 slices shown (17 of 24)]
[im 1/20]
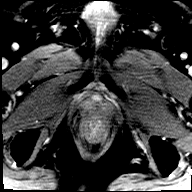

[Series 45: post t1_twist_tra_dyn-copy cent_sub_ttc=176.1s · axial · 3.5mm · 0.83mm/px · 1 of 20 slices shown]
[im 1/20]
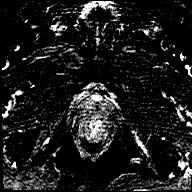

[Series 46: post t1_twist_tra_dyn-copy center · axial · 3.5mm · 0.83mm/px · 1 of 20 slices shown (18 of 24)]
[im 1/20]
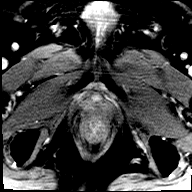

[Series 47: post t1_twist_tra_dyn-copy cent_sub_ttc=186.3s · axial · 3.5mm · 0.83mm/px · 1 of 19 slices shown]
[im 1/19]
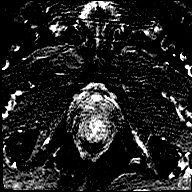

[Series 48: post t1_twist_tra_dyn-copy center · axial · 3.5mm · 0.83mm/px · 1 of 20 slices shown (19 of 24)]
[im 1/20]
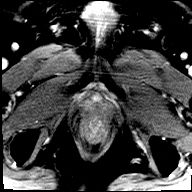

[Series 49: post t1_twist_tra_dyn-copy cent_sub_ttc=196.5s · axial · 3.5mm · 0.83mm/px · 1 of 20 slices shown]
[im 1/20]
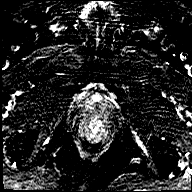

[Series 50: post t1_twist_tra_dyn-copy center · axial · 3.5mm · 0.83mm/px · 1 of 20 slices shown (20 of 24)]
[im 1/20]
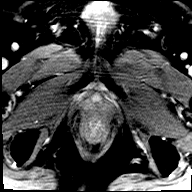

[Series 51: post t1_twist_tra_dyn-copy cent_sub_ttc=206.7s · axial · 3.5mm · 0.83mm/px · 1 of 20 slices shown]
[im 1/20]
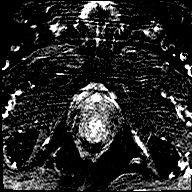

[Series 52: post t1_twist_tra_dyn-copy center · axial · 3.5mm · 0.83mm/px · 1 of 20 slices shown (21 of 24)]
[im 1/20]
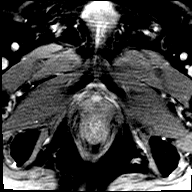

[Series 53: post t1_twist_tra_dyn-copy cent_sub_ttc=216.9s · axial · 3.5mm · 0.83mm/px · 1 of 20 slices shown]
[im 1/20]
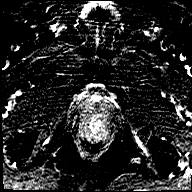

[Series 54: post t1_twist_tra_dyn-copy center · axial · 3.5mm · 0.83mm/px · 1 of 20 slices shown (22 of 24)]
[im 1/20]
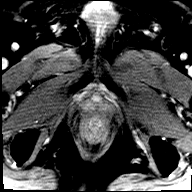

[Series 55: post t1_twist_tra_dyn-copy cent_sub_ttc=227.1s · axial · 3.5mm · 0.83mm/px · 1 of 20 slices shown]
[im 1/20]
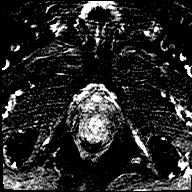

[Series 56: post t1_twist_tra_dyn-copy center · axial · 3.5mm · 0.83mm/px · 1 of 20 slices shown (23 of 24)]
[im 1/20]
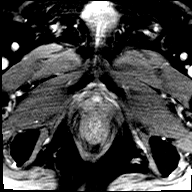

[Series 57: post t1_twist_tra_dyn-copy cent_sub_ttc=237.3s · axial · 3.5mm · 0.83mm/px · 1 of 20 slices shown]
[im 1/20]
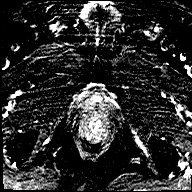

[Series 58: post t1_twist_tra_dyn-copy center · axial · 3.5mm · 0.83mm/px · 1 of 20 slices shown (24 of 24)]
[im 1/20]
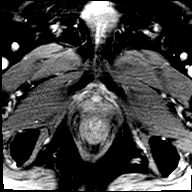

[56 of 56 positions shown; findings below may reference images not displayed]

This demonstrates only a
small focus of atypical glands in the lateral right apex.
FINDINGS: Prostate: Demonstrates moderate central gland enlargement and
heterogeneity, consistent with benign prostatic hyperplasia. Areas
of apparent peripheral zone T2 hypointensity within the right mid to
apical gland, including on image 14/series 7 are favored to
represent extruded BPH nodules, especially when compared to sagittal
image 16 and 17/series 5. No areas of restricted diffusion or early
post-contrast enhancement to suggest high-grade carcinoma.

Volume: 5.2 x 5.4 x 4.8 cm (volume = 71 cm^3).

Transcapsular spread:  Absent

Seminal vesicle involvement: Absent

Neurovascular bundle involvement: Absent

Pelvic adenopathy: Absent

Bone metastasis: Absent

Other findings: Tiny fat containing left inguinal hernia. No
significant free fluid. Scattered colonic diverticula. Apparent
bladder wall thickening is at least partially felt to be due to
underdistention. Example image 3/series 68.
IMPRESSION: 1. Moderate benign prostatic hyperplasia. No findings to strongly
suggest macroscopic or high-grade prostate carcinoma.
2. Apparent bladder wall thickening, at least partially felt to be
due to underdistention. A component of outlet obstruction cannot be
excluded.

## 2018-10-02 ENCOUNTER — Ambulatory Visit (INDEPENDENT_AMBULATORY_CARE_PROVIDER_SITE_OTHER): Payer: Medicare HMO | Admitting: Cardiology

## 2018-10-02 ENCOUNTER — Encounter: Payer: Self-pay | Admitting: *Deleted

## 2018-10-02 ENCOUNTER — Encounter: Payer: Self-pay | Admitting: Cardiology

## 2018-10-02 VITALS — BP 144/76 | HR 71 | Ht 70.0 in | Wt 168.2 lb

## 2018-10-02 DIAGNOSIS — I10 Essential (primary) hypertension: Secondary | ICD-10-CM | POA: Diagnosis not present

## 2018-10-02 DIAGNOSIS — I2510 Atherosclerotic heart disease of native coronary artery without angina pectoris: Secondary | ICD-10-CM | POA: Diagnosis not present

## 2018-10-02 DIAGNOSIS — E7800 Pure hypercholesterolemia, unspecified: Secondary | ICD-10-CM | POA: Diagnosis not present

## 2018-10-02 DIAGNOSIS — Z006 Encounter for examination for normal comparison and control in clinical research program: Secondary | ICD-10-CM

## 2018-10-02 MED ORDER — VALSARTAN 160 MG PO TABS: 160.0000 mg | ORAL_TABLET | Freq: Every day | ORAL | 3 refills | Status: DC

## 2018-10-02 NOTE — Research (Signed)
COORDINATE-Diabetes BASELINE CASE REPORT FORM (Intervention) Site #:  [id number]              Patient ID:    [id number]              INCLUSION CRITERIA  1. Is patient 18 years or older? [] No   [x] Yes  2. Based on the clinical record, does the patient have a history of type 2 diabetes mellitus? [] No   [x] Yes  3. Based on the clinical record, does the patient have a history of atherosclerotic cardiovascular disease, as defined by at least one of the clinical criteria below? [] No   [x] Yes   IF YES: Coronary Artery Disease Prior myocardial infarction Prior coronary artery bypass graft surgery Prior percutaneous coronary intervention Documented obstructive (i.e. ?50%) coronary artery disease (by angiography or CT) [] No  [x] Yes [x] No  [] Yes [] No  [x] Yes [] No  [x] Yes IF YES: date of MOST RECENT event: 07/03/2017  mm dd yyyy   Cerebrovascular Disease Prior ischemic stroke Carotid artery stenosis (?50%) [x] No []  Yes [x] No []  Yes IF YES: date of MOST RECENT event:          / /          mm dd yyyy   Peripheral Arterial Disease Prior peripheral revascularization Prior amputation due to poor circulation History of Claudication with Ankle-brachial index <0.9 [x] No []  Yes [x] No []  Yes [x] No []  Yes IF YES: date of MOST RECENT event:          / /           mm dd yyyy  4. Patient's baseline guideline-based therapy score:    1 point Currently prescribed angiotensin converting enzyme inhibitor (ACEi), angiotensin receptor blocker (ARB) or Angiotensin Receptor Neprilysin inhibitor (ARNi) therapy [] No [x]  Yes   1 point Currently prescribed high intensity statin therapy (atorvastatin 40-80mg daily OR rosuvastatin 20-40mg daily) [x] No []  Yes   1 point Most recent HbA1c <7% on metformin monotherapy? [x] No []  Yes    Calculated score: 1    EXCLUSION CRITERIA  REVIEW EACH CONDITION AND SELECT YES IF THE STATEMENT IS TRUE OR NO IF THE STATEMENT IS FALSE.  1. Determined to be highly unlikely to survive and/or to  continue follow-up in that clinic for at least 1 year, as identified by site investigator [x] No [] Yes  2. eGFR ? 30 ml/min/1.13m [x] No [] Yes  3. Baseline composite score of 3 for guideline recommended therapy [x] No [] Yes  4. Absolute contraindication to any of the 3 guideline recommended therapies    ACEi AND ARB therapy [x] No []  Yes    High intensity statin therapy [x] No  [] Yes    SGLT2I AND GLP1RA therapy [x] No  [] Yes   5. Already taking SGLT2i or GLP1RA therapy at baseline [x] No [] Yes     Subject Name: Eric Ball Subject met inclusion and exclusion criteria.  The informed consent form, study requirements and expectations were reviewed with the subject and questions and concerns were addressed prior to the signing of the consent form.  The subject verbalized understanding of the trial requirements.  The subject agreed to participate in the [facility] [facility] trial and signed the informed consent on 10/02/18 at 08:25.The informed consent was obtained prior to performance of any protocol-specific procedures for the subject.  A copy of the signed informed consent was given to the subject and a copy was placed in the subject's medical record.   JLittle ChuteHISTORY  Other Cardiovascular  Atrial Fibrillation or  Atrial Flutter [x] No [] Yes  Hypertension [] No [x] Yes  Hyperlipidemia [] No [x] Yes  Cigarette smoking [] Current [] Former [x] Never  Non-Cardiovascular  Anemia (Hgb<lower limit normal) [x] No [] Yes  Asthma [x] No [] Yes  Chronic lung disease [x] No [] Yes  Cancer - leukemia, lymphoma, or localized solid tumor [x] No [] Yes  Cancer - metastatic solid tumor [x] No [] Yes  Mild Liver Disease (Cirrhosis without portal hypertension) [x] No [] Yes  Moderate/Severe Liver Disease (Cirrhosis with portal hypertension) [x] No [] Yes  Connective Tissue Disease [x] No [] Yes  Dementia [x] No [] Yes  Depression [x] No [] Yes  Hemiplegia or paraplegia [x] No [] Yes  Human immunodeficiency virus  [x] No [] Yes  Obstructive Sleep Apnea [x] No [] Yes  Peptic Ulcer Disease [x] No [] Yes  Renal insufficiency [x] No [] Yes  Drug or Alcohol Abuse [] Current [] Former [x] Never  Diabetes History  Year of diagnosis of type 2 diabetes     Per patient 2004   History of diabetic ketoacidosis? [x] No [] Yes   Are any of the following complications of diabetes present (Select all that apply): [] Retinopathy [] Neuropathy [] Foot complications      (including ulcers, calluses,         amputation) [] Gastroparesis [x] None of the above        [] No [x] Yes  Subject educated on Coordinate diabetes and all educational materials given to subject.

## 2018-10-02 NOTE — Patient Instructions (Signed)
Medication Instructions:  STOP CLOPIDOGREL  STOP LOSARTAN AND CHANGE TO VALSARTAN 160 MG ONCE DAILY  If you need a refill on your cardiac medications before your next appointment, please call your pharmacy.   Lab work: If you have labs (blood work) drawn today and your tests are completely normal, you will receive your results only by: Marland Kitchen MyChart Message (if you have MyChart) OR . A paper copy in the mail If you have any lab test that is abnormal or we need to change your treatment, we will call you to review the results.  Follow-Up: At [facility], you and your health needs are our priority.  As part of our continuing mission to provide you with exceptional heart care, we have created designated Provider Care Teams.  These Care Teams include your primary Cardiologist (physician) and Advanced Practice Providers (APPs -  Physician Assistants and Nurse Practitioners) who all work together to provide you with the care you need, when you need it. You will need a follow up appointment in 12 months.  Please call our office 2 months in advance to schedule this appointment.  You may see [name] MD or one of the following Advanced Practice Providers on your designated Care Team:   Kerin Ransom, PA-C Roby Lofts, [place] . Sande Rives, PA-C

## 2018-10-04 ENCOUNTER — Telehealth: Payer: Self-pay | Admitting: Pharmacist

## 2018-10-04 MED ORDER — ALIROCUMAB 150 MG/ML ~~LOC~~ SOAJ: 1.0000 "pen " | SUBCUTANEOUS | 11 refills | Status: DC

## 2018-10-04 NOTE — Telephone Encounter (Signed)
Pt called to inquire about PASS Praluent application - this is still pending as of today. Sent rx into local pharmacy to determine if copay for 2020 is affordable for pt. He is aware to call pharmacy later this evening to check on pricing.

## 2018-10-05 NOTE — Telephone Encounter (Signed)
Spoke with pt - he plans on going to his pharmacy today to determine cost.

## 2018-10-08 ENCOUNTER — Telehealth: Payer: Self-pay

## 2018-10-08 NOTE — Telephone Encounter (Signed)
Pulled up patient's plan - he has a $250 deductible to meet, then Praluent is $100 per month. Returned call to pt - confirmed that he will call the [employer] to see if he qualifies for any additional patient assistance since copay is cost prohibitive through his insurance.

## 2018-10-08 NOTE — Telephone Encounter (Signed)
Pt called in to report to [facility] that the cost of his praluent at [facility] would be $350 and that he couldn't afford that... I am thinking that he has a deductible to meet and I also gave him the number to the [employer]

## 2018-10-11 DIAGNOSIS — R69 Illness, unspecified: Secondary | ICD-10-CM | POA: Diagnosis not present

## 2018-10-22 ENCOUNTER — Telehealth: Payer: Self-pay | Admitting: Pharmacist

## 2018-10-22 NOTE — Telephone Encounter (Signed)
Patient approved for PASS program. Made aware to contact company for shipment of medication

## 2018-10-30 DIAGNOSIS — Z1389 Encounter for screening for other disorder: Secondary | ICD-10-CM | POA: Diagnosis not present

## 2018-10-30 DIAGNOSIS — Z0001 Encounter for general adult medical examination with abnormal findings: Secondary | ICD-10-CM | POA: Diagnosis not present

## 2018-10-30 DIAGNOSIS — Z681 Body mass index (BMI) 19 or less, adult: Secondary | ICD-10-CM | POA: Diagnosis not present

## 2018-10-30 DIAGNOSIS — E7849 Other hyperlipidemia: Secondary | ICD-10-CM | POA: Diagnosis not present

## 2018-10-30 DIAGNOSIS — K219 Gastro-esophageal reflux disease without esophagitis: Secondary | ICD-10-CM | POA: Diagnosis not present

## 2018-10-30 DIAGNOSIS — I2510 Atherosclerotic heart disease of native coronary artery without angina pectoris: Secondary | ICD-10-CM | POA: Diagnosis not present

## 2018-12-06 DIAGNOSIS — Z6824 Body mass index (BMI) 24.0-24.9, adult: Secondary | ICD-10-CM | POA: Diagnosis not present

## 2018-12-06 DIAGNOSIS — E119 Type 2 diabetes mellitus without complications: Secondary | ICD-10-CM | POA: Diagnosis not present

## 2018-12-06 DIAGNOSIS — N401 Enlarged prostate with lower urinary tract symptoms: Secondary | ICD-10-CM | POA: Diagnosis not present

## 2018-12-06 DIAGNOSIS — I10 Essential (primary) hypertension: Secondary | ICD-10-CM | POA: Diagnosis not present

## 2018-12-17 DIAGNOSIS — I2510 Atherosclerotic heart disease of native coronary artery without angina pectoris: Secondary | ICD-10-CM | POA: Diagnosis not present

## 2018-12-17 DIAGNOSIS — E785 Hyperlipidemia, unspecified: Secondary | ICD-10-CM | POA: Diagnosis not present

## 2018-12-17 DIAGNOSIS — G4730 Sleep apnea, unspecified: Secondary | ICD-10-CM | POA: Diagnosis not present

## 2018-12-17 DIAGNOSIS — R32 Unspecified urinary incontinence: Secondary | ICD-10-CM | POA: Diagnosis not present

## 2018-12-17 DIAGNOSIS — J309 Allergic rhinitis, unspecified: Secondary | ICD-10-CM | POA: Diagnosis not present

## 2018-12-17 DIAGNOSIS — E119 Type 2 diabetes mellitus without complications: Secondary | ICD-10-CM | POA: Diagnosis not present

## 2018-12-17 DIAGNOSIS — I252 Old myocardial infarction: Secondary | ICD-10-CM | POA: Diagnosis not present

## 2018-12-17 DIAGNOSIS — K219 Gastro-esophageal reflux disease without esophagitis: Secondary | ICD-10-CM | POA: Diagnosis not present

## 2018-12-17 DIAGNOSIS — N400 Enlarged prostate without lower urinary tract symptoms: Secondary | ICD-10-CM | POA: Diagnosis not present

## 2018-12-17 DIAGNOSIS — I10 Essential (primary) hypertension: Secondary | ICD-10-CM | POA: Diagnosis not present

## 2019-01-22 DIAGNOSIS — D225 Melanocytic nevi of trunk: Secondary | ICD-10-CM | POA: Diagnosis not present

## 2019-01-22 DIAGNOSIS — D2262 Melanocytic nevi of left upper limb, including shoulder: Secondary | ICD-10-CM | POA: Diagnosis not present

## 2019-01-22 DIAGNOSIS — L812 Freckles: Secondary | ICD-10-CM | POA: Diagnosis not present

## 2019-01-22 DIAGNOSIS — D2261 Melanocytic nevi of right upper limb, including shoulder: Secondary | ICD-10-CM | POA: Diagnosis not present

## 2019-01-22 DIAGNOSIS — L218 Other seborrheic dermatitis: Secondary | ICD-10-CM | POA: Diagnosis not present

## 2019-01-22 DIAGNOSIS — D1801 Hemangioma of skin and subcutaneous tissue: Secondary | ICD-10-CM | POA: Diagnosis not present

## 2019-01-22 DIAGNOSIS — L821 Other seborrheic keratosis: Secondary | ICD-10-CM | POA: Diagnosis not present

## 2019-01-22 DIAGNOSIS — L820 Inflamed seborrheic keratosis: Secondary | ICD-10-CM | POA: Diagnosis not present

## 2019-02-12 ENCOUNTER — Encounter: Payer: Self-pay | Admitting: *Deleted

## 2019-02-12 DIAGNOSIS — Z006 Encounter for examination for normal comparison and control in clinical research program: Secondary | ICD-10-CM

## 2019-02-12 NOTE — Research (Signed)
Marland KitchenMarland KitchenMarland KitchenMarland KitchenMarland KitchenMarland KitchenMarland Kitchen   COORDINATE-Diabetes 3 Month Patient Questionnaire (Intervention) Site #:   676   Patient ID:            [id number]       3 MONTH QUESTIONNAIRE  Visit Date      02/12/2019      MM DD YYYY  Vital Status [x]   Patient Alive > Proceed to Visit Status []   Patient Dead > Complete Death Form only []   Unknown > Proceed to Visit Status  Visit Status Was the interview completed? []  No >IF NO, Select reason why [] Unable to locate                                    [] No Valid Contacts (patients or alternates) [] Multiple attempts to valid contacts []  Patient no longer cared for at study clinic []  Patient withdrew []  Other, specify:                                           >IF NO, Last date of contact: / /             MM      DD        YYYY    [x] Yes >IF YES, Select source of Interview: []   Proxy [x]   Patient     3 Month Patient Questionnaire (Intervention)  Instructions:   1. Prior to speaking with the patient either over the phone or in person, print off or have available the patient's current list of medications.      IF conducting follow-up visit over the phone - Instruct the patient to gather all their pill bottles. 2. For the purpose of the COORDINATE-Diabetes Study, please document whether the patient is currently taking each of the following medication classes. 3. DO NOT PROMPT DURING FOLLOW-UP VISIT: IF subject mentions reaction to one of the following medications, complete the AE/SAE section and follow instructions in operations manual regarding Adverse event reporting to [employer] ([facility]).  MEDICATIONS  Medication Are you currently taking [insert name of previous med]? If currently taking: If started since last visit: If stopped since last visit:  ACE Inhibitor / Angiotensin Receptor Blocker (ARB) / Angiotensin Receptor Neprilysin inhibitor (ARNi) [] No >  [x] Yes > [] Stopped since last visit [] Never prescribed [] Started since last visit [x] Same medication as  last     visit [] Different medication than       last visit [Study personnel to answer]  Documented in EHR? [] No  [] Yes   If no, Verified by: [] Photo of bottle [] Copy of rx [] Dispensing       pharmacy [] Prescribing provider [] Other (specify):                    Date started:        / /             MM DD YYYY Who prescribed this medication for you? [] Cardiology provider > [] Study clinic [] Outside clinic [] Endocrinology provider [] Primary care provider [] Other provider > Specify:                             [] Unknown Date discontinued:        / /  MM DD YYYY Why did you stop taking this medication? (check all that apply) [] Allergic reaction [] Medication side effects [] Unable to       adhere/monitor [] Had an      operation/procedure      that required      stopping it [] Unable to afford it [] No longer wants        to take        this medication [] Provider decision [] Pregnancy [] Other (specify: ) [] Unknown Reason   If started or changed  What medication are you taking now? [] Benazepril (Lotensin) [] Captopril (Capoten) [] Enalapril (Vasotec) [] Fosinopril (Monopril) [] Lisinopril (Zestril, Prinivil) [] Quinapril (Accupril) [] Ramipril (Altace) [] Azilsartan Earnest Rosier) [] Candesartan (Atacand) [] Irbesartan (Avapro) [] Losartan (Cozaar) [] Olmesartan (Benicar) [] Telmisartan (Micardis) [] Valsartan (Diovan) [] Sacrubitril/Valsartan Delene Loll)      Medication Are you currently taking [insert name of previous med]? If currently taking: If started since last visit: If stopped since last visit:  Statin [x]  No >  [] Yes > [] Stopped since last visit [x] Never prescribed [] Started since last visit [] Same medication as last        visit [] Different medication or dose     than last visit [Study personnel to answer]  Documented in EHR? [] No [] Yes    If no, Verified by: [] Photo of bottle [] Copy of rx [] Dispensing pharmacy [] Prescribing  provider [] Other (specify):                    Date started:        / /             MM DD YYYY Who prescribed this medication for you? [] Cardiology provider  [] Study clinic [] Outside clinic [] Endocrinology provider [] Primary care provider [] Other provider  Specify:                             [] Unknown Date discontinued:        / /             MM DD YYYY Why did you stop taking this medication? (check all that apply) [] Allergic reaction [] Medication side effects  [] Muscle aches [] Weakness [] Joint pain [] Cognitive symptoms [] Other (specify):                          [] Unable to        adhere/monitor [] Had an    operation/procedure    that required stopping it [] Unable to afford it [] No longer wants       to take this            medication [] Provider decision [] Pregnancy [] Other (specify: ) [] Unknown Reason   If started or changed  What medication are you taking now? [] Atorvastatin (Lipitor) [] Fluvastatin (Lescol) [] Lovastatin (Mevacor) [] Pravastatin (Pravachol) [] Rosuvastatin (Crestor) [] Simvastatin (Zocor) [] Pitatavastatin (Livalo)  Dose: [] 1 mg  [] 10 mg [] 2 mg  []  20 mg [] 3 mg  []  40 mg [] 4 mg  []  60 mg [] 5 mg  []  80 mg  Frequency: [] Daily []  Less than daily      Medication Are you currently taking [insert name of previous med]? If currently taking: If started since last visit: If stopped since last visit:  SGLT2 Inhibitor [x] No   [] Yes [] Stopped since last visit [x] Never prescribed [] Started since last visit [] Same medication as last      visit [] Different medication than last visit [Study personnel to answer]  Documented in EHR? [] No [] Yes   If no, Verified by: [] Photo  of bottle [] Copy of rx [] Dispensing       pharmacy [] Prescribing       provider [] Other (specify):                    Date started:        / /             MM DD YYYY Who prescribed this medication for you? [] Cardiology provider  [] Study clinic [] Outside  clinic [] Endocrinology provider [] Primary care provider [] Other provider  Specify:                             [] Unknown Date discontinued:        / /             MM DD YYYY Why did you stop taking this medication? (check all that apply) [] Allergic reaction [] Medication side effects [] Unable to       adhere/monitor [] Had an        operation/procedure         that required           stopping it [] Patient unable to       afford it [] Patient no longer       wants to       take this medication [] Provider decision [] Pregnancy [] Other (specify: ) [] Unknown Reason   If started or changed  What medication are you taking now? [] Canaglifozin (Invokana) [] Dapagliflozin Wilder Glade) [] Empaglifozin (Jardiance) [] Ertugliflozin (Steglatro)     GLP1 Receptor Agonist [x] No   [] Yes [] Stopped since last visit [x] Never prescribed [] Started since last visit [] Same medication as last      visit [] Different medication than       last visit [Study personnel to answer]  Documented in EHR? [] No [] Yes   If no, Verified by: [] Photo of bottle [] Copy of rx [] Dispensing       pharmacy [] Prescribing       provider [] Other (specify):                    Date started:        / /             MM DD YYYY Who prescribed this medication for you? [] Cardiology provider  [] Study clinic [] Outside clinic [] Endocrinology provider [] Primary care provider [] Other provider  Specify:                             [] Unknown Date discontinued:        / /             MM DD YYYY Why did you stop taking this medication? (check all that apply) [] Allergic reaction [] Medication side effects [] Unable to      adhere/monitor [] Had an    operation/procedure    that required stopping it [] Patient unable to        afford it [] Patient no longer        wants to take this        medication [] Provider decision [] Pregnancy [] Other (specify: ) [] Unknown Reason   If started or changed  What medication  are you taking now? [] Albiglutide (Tanzeum) [] Dulaglutide (Trulicity) [] Exanatide (Byetta, Bydureon) [] Liraglutide (Victoza, Saxenda) [] Lixisenatide (Adlyxin) [] Semaglutice (Ozempic)     10/02/2018 (EDC Release)

## 2019-03-14 DIAGNOSIS — R972 Elevated prostate specific antigen [PSA]: Secondary | ICD-10-CM | POA: Diagnosis not present

## 2019-03-18 DIAGNOSIS — Z6824 Body mass index (BMI) 24.0-24.9, adult: Secondary | ICD-10-CM | POA: Diagnosis not present

## 2019-03-18 DIAGNOSIS — I10 Essential (primary) hypertension: Secondary | ICD-10-CM | POA: Diagnosis not present

## 2019-03-18 DIAGNOSIS — E1140 Type 2 diabetes mellitus with diabetic neuropathy, unspecified: Secondary | ICD-10-CM | POA: Diagnosis not present

## 2019-03-18 DIAGNOSIS — N400 Enlarged prostate without lower urinary tract symptoms: Secondary | ICD-10-CM | POA: Diagnosis not present

## 2019-03-18 DIAGNOSIS — Z1389 Encounter for screening for other disorder: Secondary | ICD-10-CM | POA: Diagnosis not present

## 2019-03-18 DIAGNOSIS — E119 Type 2 diabetes mellitus without complications: Secondary | ICD-10-CM | POA: Diagnosis not present

## 2019-03-21 DIAGNOSIS — N401 Enlarged prostate with lower urinary tract symptoms: Secondary | ICD-10-CM | POA: Diagnosis not present

## 2019-03-21 DIAGNOSIS — R351 Nocturia: Secondary | ICD-10-CM | POA: Diagnosis not present

## 2019-03-21 DIAGNOSIS — N4232 Atypical small acinar proliferation of prostate: Secondary | ICD-10-CM | POA: Diagnosis not present

## 2019-04-10 DIAGNOSIS — R69 Illness, unspecified: Secondary | ICD-10-CM | POA: Diagnosis not present

## 2019-05-01 ENCOUNTER — Encounter: Payer: Self-pay | Admitting: *Deleted

## 2019-05-01 DIAGNOSIS — Z006 Encounter for examination for normal comparison and control in clinical research program: Secondary | ICD-10-CM

## 2019-05-01 NOTE — Research (Signed)
COORDINATE-Diabetes 6 Month CASE REPORT FORM (Intervention) -EHR Site #:   [id number]              Patient ID:         [id number]      6 MONTH EHR REVIEW  Medical Record Check Date 05/01/19   Vital Status [x] Patient Alive >Date last known alive per EHR:   [] Patient Dead >> Complete Death Form  [] Unknown   CLINICAL EVENTS / PROCEDURES  Hospitalization since last visit? (>=24 hour stay) [x] No [] Yes  >> if yes, Complete the following  Date of hospital admission:        / /             MM DD YYYY  Primary discharge diagnosis:  *Complete appropriate event validation form [] acute myocardial infarction (heart attack)* [] stroke* [] heart failure* [] coronary revascularization* [] peripheral revascularization* [] cerebral revascularization* [] diabetes (e.g. hypoglycemia, DKA) [] renal failure [] amputation [] other cardiovascular reason [] other NON-cardiovascular reason [] unknown  Other diagnoses not documented above: (check all that apply)  *Complete appropriate event validation form [] acute myocardial infarction (heart attack)* [] stroke* [] heart failure* [] coronary revascularization* [] peripheral revascularization* [] cerebral revascularization* [] diabetes (e.g. hypoglycemia, DKA) [] renal failure [] amputation [] other cardiovascular reason [] other NON-cardiovascular reason [] unknown  Were any of the following outpatient procedures done since the last visit? (I.e. procedures not captured above)  Coronary revascularization [x] No [] Yes >IF YES, Date / /             MM DD YYYY  Peripheral revascularization [x] No [] Yes > IF YES, Date / /             MM DD YYYY  Cerebral revascularization [x] No [] Yes > IF YES, Date / /             MM DD YYYY  Extremity amputation    [x] No [] Yes >IF YES, Date / /             MM      DD       YYYY  Renal replacement therapy (i.e. dialysis)    [x] No [] Yes > IF YES, Date of initiation / /             MM      DD       YYYY   **EDC will allow for collection of  multiple hospitalizations and procedures   MEDICATIONS  Medication Currently Prescribed? Valsartan 160 mg daily  If started since last visit: If not started since last visit: If stopped since last visit:  Cardiac Medications  ACE Inhibitor / Angiotensin Receptor Blocker (ARB) / Angiotensin Receptor Neprilysin inhibitor (ARNi)  [] No >  [x] Yes > Since last visit, medication was: [] Stopped [] Not started [] Started [x] Continued same medication [] Continued with medication changes Date started:        / /             MM DD YYYY Who prescribed? [] Cardiology provider  [] Study clinic [] Outside clinic [] Endocrinology provider [] Primary care provider [] Other provider  Specify:                             [] Unknown Reason (check all that apply): [] History of swelling around lips, eyes or face [] Feeling dizzy/lightheaded [] Low blood pressure [] Poor or fluctuating kidney function [] High potassium [] Patient has experienced other side effects to this medication  before [] Patient will be unable to adhere/monitor [] Patient unable to afford it [] Patient does not want to      take this medication []   Pregnancy [] Other (specify: ) [] Unknown Reason Date discontinued:        / /             MM DD YYYY Reason (check all that apply): [] Swelling around lips, eyes       or face [] Feeling dizzy/lightheaded [] Low blood pressure [] Poor or fluctuating kidney function [] High potassium [] Other medication side       effects [] Patient unable to        adhere/monitor [] Had an       operation/procedure that        required stopping it [] Patient unable to afford it [] Patient no longer wants       to take this medication [] Pregnancy [] Other (specify: ) [] Unknown Reason   If started or changed  Medication Name: [] Benazepril (Lotensin) [] Captopril (Capoten) [] Enalapril (Vasotec) [] Fosinopril (Monopril) [] Lisinopril (Zestril, Prinivil) [] Quinapril (Accupril) [] Ramipril  (Altace) [] Azilsartan (Edarbi) [] Candesartan (Atacand) [] Irbesartan (Avapro) [] Losartan (Cozaar) [] Olmesartan (Benicar) [] Telmisartan (Micardis) [] Valsartan (Diovan) [] Sacrubitril/Valsartan (Entresto)     Beta Blocker [] No         [x]  Yes > [] Acebutolol (Sectral) [] Bisoprolol (Zebeta) [x] Carvedilol (Coreg) [] Labetalol (Trandate,      Normodyne) [] Metoprolol succinate (Toprol) [] Metoprolol tartrate       (Lopressor) [] Nadolol (Corgard) [] Nebivolol (Bystolic) [] Propranolol (Inderal) [] Sotalol (Betapace)     Medication Currently Prescribed? If started since last visit: If not started since last visit: If stopped since last visit:  Aldosterone Antagonist [] No [] Yes > [] Amiloride [] Eplerenone (Inspra) [] Spirinolactone (Aldactone) [] Traimterene (Dyrenium)   Calcium Channel Blocker [x] No []  Yes > Medication Name: [] Amlodipine (Norvasc) [] Diltiazem (Cardizem) [] Felodipine (Plendil) [] Nifedipine (Procardia) [] Verapamil (Calan)   Diuretic Loop [x] No []  Yes > Medication Name: [] Bumetanide (Bumex) [] Ethacrynic acid (Edecrin) [] Furosemide (Lasix) [] Torsemide (Demadex)   Diuretic Thiazide- type [x] No []  Yes > Medication Name: [] Chlorothiazide      [] Chlorthalidone [] Hydrochlorothiazide [] Indapamide [] Metolazone   Anticoagulation Therapy (other than Warfarin) [x] No []  Yes > Medication Name: [] Apixaban (Eliquis) [] Edoxaban (Lixiana) [] Rivaroxaban Eric Ball) [] Dabigatran (Redaxa)   Warfarin [x] No []  Yes    Antiplatelet Agent (including aspirin) [] No [x]  Yes > Medication Name (check all that apply): [x] Aspirin [] Clopidogrel (Plavix) [] Prasugrel (Effient) [] Ticagrelor (Brilinta) [] Ticlopidine (Ticlid) [] Dipyridamole (Persantine)    Medication Currently Prescribed? If started since last visit: If not started since last visit: If stopped since last visit:  Statin  [x] No >  [] Yes > Since last visit, medication was: [] Stopped [x] Not  started [] Started [] Continued same medication and dose [] Continued with dose or medication changes Date started:        / /             MM DD YYYY Who prescribed? [] Cardiology provider [] Endocrinology provider [] Primary care provider [] Other provider  Specify:                             [] Unknown Reason (check all that apply): [] History of Rhabdomyolysis [] LDL-cholesterol already       <70 [] Muscle      aches/pain/weakness [] Mental       fogginess/memory loss [] Liver dysfunction [x] Patient has  experienced other side  effects to this medication before [] Patient will be unable to adhere/monitor [] Patient unable to afford it [] Patient does not want to take this medication [] Pregnancy [] Other (specify: ) [] Unknown Reason Date discontinued:        / /             MM DD YYYY Reason (check all that apply): [] Rhabdomyolysis [] Muscle aches/pain/weakness [] Mental fogginess/memory loss []   Liver dysfunction [] Other medication side effects [] Patient unable to          adhere/monitor [] Patient unable to afford it [] Patient no longer wants to take       this medication [] Pregnancy [] Other (specify: ) [] Unknown Reason   If started or changed  Medication Name: [] Atorvastatin (Lipitor) [] Fluvastatin (Lescol) [] Lovastatin (Mevacor) [] Pravastatin (Pravachol) [] Rosuvastatin (Crestor) [] Simvastatin (Zocor) [] Pitatavastatin (Livalo)  Dose: [] 1 mg [] 10 mg [] 2 mg []  20 mg [] 3 mg []  40 mg [] 4 mg []  60 mg [] 5 mg []  80 mg  Frequency: [] Daily  [] Less than daily      Does the patient have statin intolerance that prevents the use of maximum dose of high potency statin? [] No [x] Yes >IF YES, Complete Statin Intolerance form   Non-statin lipid lowering therapy [] No [x] Yes > Medication Name (check all that apply): [] Colesevelam (Welchol) [] Ezetimibe (Zetia) [] Fibrate [] Niacin [x] PCSK9 inhibitor [x] Omega 3 acid ethyl esters (Lovaza) [] Icosapent Ethyl (Vascepa) [] Over the  counter omega 3 fatty acid or fish oil supplement     Medication Currently Prescribed? If started since last visit: If not started since last visit: If stopped since last visit:  Diabetes Medications  SGLT2 Inhibitor  [x] No >  [] Yes > Since last visit, medication was: [] Stopped [] Not started [] Started [] Continued same medication [] Continued with medication changes Date started:        / /             MM DD YYYY Who prescribed? [] Cardiology provider  [] Study clinic [] Outside clinic [] Endocrinology      provider [] Primary care provider [] Other provider  Specify:                             [] Unknown Reason (check all that apply): [] eGFR <45 [] HbA1c<7% on metformin monotherapy OR already on GLP1RA and do not need to start another anti- hyperglycemic [] Already dehydrated [] Low blood pressure [] High risk of Hypoglycemia [] Prior DKA [] Recurrent mycotic genital infections [] History of or at risk for amputation [] Patient has experienced other side effects to this medication before [] Patient will be unable to adhere/monitor [] Patient unable to afford it [] Patient does not want to take this medication [] Pregnancy [] Other (specify: ) [] Unknown Reason Date discontinued:        / /             MM DD YYYY Reason (check all that apply  [] eGFR now <45 [] Dehydration [] Low blood pressure [] Hypoglycemia [] DKA [] Mycotic genital infection [] Amputation [] Other medication side effects [] Patient unable    to adhere/monitor  [] Had an operation/procedure     that required stopping it [] Patient unable to afford it [] Patient no longer wants to      take this medication [] Pregnancy [] Other (specify: ) [] Unknown Reason   If started or changed  Medication Name: [] Canaglifozin (Invokana) [] Dapagliflozin Wilder Glade) [] Empaglifozin (Jardiance) [] Ertugliflozin Actuary)           Medication Currently Prescribed? If started since last visit: If not started since last  visit: If stopped since last visit:  GLP1 Receptor Agonist  [x] No >  [] Yes > Since last visit, medication was: [] Stopped [] Not started [] Started [] Continued same medication [] Continued with medication changes Date started:        / /             MM DD YYYY Who prescribed? [] Cardiology provider  [] Study clinic [] Outside clinic [] Endocrinology       provider [] Primary care provider [] Other provider > Specify:                             []   Unknown Reason (check all that apply): [] Personal or family history of medullary thyroid cancer [] MEN2 [] HbA1c<7% on metformin monotherapy OR already on SGLT2i and do not need to start another anti-hyperglycemic [] eGFR now <30 [] High risk of Hypoglycemia [] History of pancreatitis [] Significant gastroparesis [] Prior gastric surgery [] Patient has experienced other side effects to this medication before [] Patient will be unable to adhere/monitor [] Patient unable to afford it [] Patient does not want to take this medication [] Pregnancy [] Other (specify: ) [] Unknown Reason Date discontinued:        / /             MM DD YYYY Reason (check all that apply): [] Medullary thyroid cancer [] MEN2 [] eGFR now <30 [] Hypoglycemia [] Pancreatitis [] Significant gastroparesis [] Gastric surgery [] Other medication side       effects []   Patient  unable    to adhere/monitor                                  [] Had an operation/procedure that required stopping it [] Patient unable to afford it [] Patient no longer wants to take this medication [] Pregnancy [] Other (specify: ) [] Unknown Reason   If started or changed > Medication Name: [] Albiglutide (Tanzeum) [] Dulaglutide (Trulicity) [] Exanatide (Byetta, Bydureon) [] Liraglutide (Victoza, Saxenda) [] Lixisenatide (Adlyxin) [] Semaglutice (Ozempic)      Medication Currently Prescribed? If started since last visit: If not started since last visit: If stopped since last visit:  Other non Insulin  diabetes medications [] No [x] Yes > Medication Name (check all that apply): [] Acarbose (Precose) [] Miglitol (Glyset) [] Glimepiride (Amaryl) [x] Glipizide (Amaryl) [] Glyburide (Diabeta,       Glynase,   Micronase) [x] Metformin (Fortamet,        Glucophage[including XR],        Glumetza, Riomet) [] Pioglitazone (Actos) [] Nateglinide (Starlix) [] Pramlintide (Symilin) [] Repaglinide (Prandin) [] Rosiglitazone (Avandia) [] Alogliptin (Nesina) [] Linagliptin (Tradjenta) [] Saxagliptin (Onglyza) [] Sitagliptin (Januvia) [] Bromocriptine Quick Release (Cycloset)     Insulin [x] No []  Yes > total daily dose: units     STATIN INTOLERANCE (PER EHR/OTHER SOURCE DATA)  1. Was CK checked? [x] No [] Yes   >If yes, select from the following: [] CK not elevated [] CK elevated 1-5x upper limit of normal [] CK elevated >5x upper limit of normal  2. Does the patient have muscle symptoms? [x] No [] Yes    >If yes, select from the following: Location and pattern of muscle symptoms (select all that apply) [] Symmetric, hip flexors or thighs [] Symmetric, calves [] Symmetric, proximal upper extremity [] Asymmetric, intermittent, or not specific to any area [] Unknown   Timing of muscle symptom in relation to starting statin regimen [] <4 weeks [] 4-12 weeks [] >12 weeks [] Unknown   Timing of muscle symptoms improvement after withdrawal of statin [] <2 weeks [] 2-4 weeks [] No improvement after 4 weeks [] Unknown  3. Was patient re-challenged with a statin regimen (even if same statin compound or regimen as above)?  [] No  [] Yes  [x] Unknown  >If yes, select from the following: Timing of recurrence of similar muscle symptoms in relation to starting second regimen [] <4 weeks [] 4-12 weeks [] >12 weeks [] Similar symptoms did not recur [] Unknown   [email or web address]    Spoke with patient via phone for Coordinate DM 1M follow up. Pt doing well with not complaints. No AE/SAE to  report. No change in medication. Education provided.Next phone visit scheduled for December.

## 2019-07-04 DIAGNOSIS — E119 Type 2 diabetes mellitus without complications: Secondary | ICD-10-CM | POA: Diagnosis not present

## 2019-07-04 DIAGNOSIS — I10 Essential (primary) hypertension: Secondary | ICD-10-CM | POA: Diagnosis not present

## 2019-07-04 DIAGNOSIS — G4733 Obstructive sleep apnea (adult) (pediatric): Secondary | ICD-10-CM | POA: Diagnosis not present

## 2019-07-04 DIAGNOSIS — Z6824 Body mass index (BMI) 24.0-24.9, adult: Secondary | ICD-10-CM | POA: Diagnosis not present

## 2019-07-04 DIAGNOSIS — N400 Enlarged prostate without lower urinary tract symptoms: Secondary | ICD-10-CM | POA: Diagnosis not present

## 2019-07-31 ENCOUNTER — Other Ambulatory Visit: Payer: Self-pay | Admitting: Cardiology

## 2019-08-05 ENCOUNTER — Telehealth: Payer: Self-pay | Admitting: Pharmacist

## 2019-08-05 DIAGNOSIS — G4730 Sleep apnea, unspecified: Secondary | ICD-10-CM | POA: Diagnosis not present

## 2019-08-05 MED ORDER — PRALUENT 150 MG/ML ~~LOC~~ SOAJ: 1.0000 "pen " | SUBCUTANEOUS | 3 refills | Status: DC

## 2019-08-05 NOTE — Telephone Encounter (Addendum)
Left message for pt - need SSN to apply for [employer] for [employer]. Household income is [phone] for household of 2 per last [employer] application.

## 2019-08-05 NOTE — Telephone Encounter (Signed)
Pt returned call to clinic. Submitted [employer] grant on the phone, pt was approved. Rx sent to local pharmacy, provided them with info for [employer] grant for $0 copay. Pt is aware to call with concerns.

## 2019-08-06 NOTE — Telephone Encounter (Signed)
[facility] called stating that Rx for Praluent is saying PA needed. Advised that per our records PA is good through 07/24/20. Advised they call the [employer] help desk

## 2019-08-06 NOTE — Telephone Encounter (Signed)
Pharmacist called back and said she called [employer] who stated they did not have anything on file for patient. I have resubmitted the prior authorization to [facility]. Letter received that it has been approved through 07-24-20. Called pharmacy to let them know. They were able to process Rx

## 2019-08-20 DIAGNOSIS — G4733 Obstructive sleep apnea (adult) (pediatric): Secondary | ICD-10-CM | POA: Diagnosis not present

## 2019-09-02 ENCOUNTER — Encounter: Payer: Self-pay | Admitting: *Deleted

## 2019-09-02 DIAGNOSIS — Z006 Encounter for examination for normal comparison and control in clinical research program: Secondary | ICD-10-CM

## 2019-09-02 NOTE — Research (Signed)
Marland KitchenMarland KitchenMarland KitchenMarland KitchenMarland KitchenMarland KitchenMarland Kitchen   COORDINATE-Diabetes 9 Month Patient Questionnaire (Intervention) Site #:   [id number]   Patient ID:   [id number]                3 MONTH QUESTIONNAIRE  Visit Date 09/02/19   Vital Status [x]   Patient Alive > Proceed to Visit Status []   Patient Dead > Complete Death Form only []   Unknown > Proceed to Visit Status  Visit Status Was the interview completed? []  No >IF NO, Select reason why [] Unable to locate                                    [] No Valid Contacts (patients or alternates) [] Multiple attempts to valid contacts []  Patient no longer cared for at study clinic []  Patient withdrew []  Other, specify:                                           >IF NO, Last date of contact: / /             MM      DD        YYYY    [] Yes >IF YES, Select source of Interview: []   Proxy []   Patient     3 Month Patient Questionnaire (Intervention)  Instructions:   1. Prior to speaking with the patient either over the phone or in person, print off or have available the patient's current list of medications.      IF conducting follow-up visit over the phone - Instruct the patient to gather all their pill bottles. 2. For the purpose of the COORDINATE-Diabetes Study, please document whether the patient is currently taking each of the following medication classes. 3. DO NOT PROMPT DURING FOLLOW-UP VISIT: IF subject mentions reaction to one of the following medications, complete the AE/SAE section and follow instructions in operations manual regarding Adverse event reporting to [employer] ([facility]).  MEDICATIONS  Medication Are you currently taking [insert name of previous med]? If currently taking: If started since last visit: If stopped since last visit:  ACE Inhibitor / Angiotensin Receptor Blocker (ARB) / Angiotensin Receptor Neprilysin inhibitor (ARNi) [] No >  [x] Yes > [] Stopped since last visit [] Never prescribed [] Started since last visit [] Same medication as last     visit [] Different  medication than       last visit [Study personnel to answer]  Documented in EHR? [] No  [x] Yes   If no, Verified by: [] Photo of bottle [] Copy of rx [] Dispensing       pharmacy [] Prescribing provider [] Other (specify):                    Date started:        / /             MM DD YYYY Who prescribed this medication for you? [] Cardiology provider > [] Study clinic [] Outside clinic [] Endocrinology provider [] Primary care provider [] Other provider > Specify:                             [] Unknown Date discontinued:        / /             MM DD YYYY Why did you stop taking this medication? (  check all that apply) [] Allergic reaction [] Medication side effects [] Unable to       adhere/monitor [] Had an      operation/procedure      that required      stopping it [] Unable to afford it [] No longer wants        to take        this medication [] Provider decision [] Pregnancy [] Other (specify: ) [] Unknown Reason   If started or changed  What medication are you taking now? [] Benazepril (Lotensin) [] Captopril (Capoten) [] Enalapril (Vasotec) [] Fosinopril (Monopril) [] Lisinopril (Zestril, Prinivil) [] Quinapril (Accupril) [] Ramipril (Altace) [] Azilsartan (Edarbi) [] Candesartan (Atacand) [] Irbesartan (Avapro) [] Losartan (Cozaar) [] Olmesartan (Benicar) [] Telmisartan (Micardis) [] Valsartan (Diovan) [] Sacrubitril/Valsartan Delene Loll)      Medication Are you currently taking [insert name of previous med]? If currently taking: If started since last visit: If stopped since last visit:  Statin [x]  No >  [] Yes > [] Stopped since last visit [x] Never prescribed [] Started since last visit [] Same medication as last        visit [] Different medication or dose     than last visit [Study personnel to answer]  Documented in EHR? [] No [] Yes    If no, Verified by: [] Photo of bottle [] Copy of rx [] Dispensing pharmacy [] Prescribing provider [] Other (specify):                     Date started:        / /             MM DD YYYY Who prescribed this medication for you? [] Cardiology provider  [] Study clinic [] Outside clinic [] Endocrinology provider [] Primary care provider [] Other provider  Specify:                             [] Unknown Date discontinued:        / /             MM DD YYYY Why did you stop taking this medication? (check all that apply) [] Allergic reaction [] Medication side effects  [] Muscle aches [] Weakness [] Joint pain [] Cognitive symptoms [] Other (specify):                          [] Unable to        adhere/monitor [] Had an    operation/procedure    that required stopping it [] Unable to afford it [] No longer wants       to take this            medication [] Provider decision [] Pregnancy [] Other (specify: ) [] Unknown Reason   If started or changed  What medication are you taking now? [] Atorvastatin (Lipitor) [] Fluvastatin (Lescol) [] Lovastatin (Mevacor) [] Pravastatin (Pravachol) [] Rosuvastatin (Crestor) [] Simvastatin (Zocor) [] Pitatavastatin (Livalo)  Dose: [] 1 mg  [] 10 mg [] 2 mg  []  20 mg [] 3 mg  []  40 mg [] 4 mg  []  60 mg [] 5 mg  []  80 mg  Frequency: [] Daily []  Less than daily      Medication Are you currently taking [insert name of previous med]? If currently taking: If started since last visit: If stopped since last visit:  SGLT2 Inhibitor [x] No   [] Yes [] Stopped since last visit [x] Never prescribed [] Started since last visit [] Same medication as last      visit [] Different medication than last visit [Study personnel to answer]  Documented in EHR? [] No [] Yes   If no, Verified by: [] Photo of bottle [] Copy of rx [] Dispensing  pharmacy [] Prescribing       provider [] Other (specify):                    Date started:        / /             MM DD YYYY Who prescribed this medication for you? [] Cardiology provider  [] Study clinic [] Outside clinic [] Endocrinology  provider [] Primary care provider [] Other provider  Specify:                             [] Unknown Date discontinued:        / /             MM DD YYYY Why did you stop taking this medication? (check all that apply) [] Allergic reaction [] Medication side effects [] Unable to       adhere/monitor [] Had an        operation/procedure         that required           stopping it [] Patient unable to       afford it [] Patient no longer       wants to       take this medication [] Provider decision [] Pregnancy [] Other (specify: ) [] Unknown Reason   If started or changed  What medication are you taking now? [] Canaglifozin (Invokana) [] Dapagliflozin (Farxiga) [] Empaglifozin (Jardiance) [] Ertugliflozin (Steglatro)     GLP1 Receptor Agonist [x] No   [] Yes [] Stopped since last visit [x] Never prescribed [] Started since last visit [] Same medication as last      visit [] Different medication than       last visit [Study personnel to answer]  Documented in EHR? [] No [] Yes   If no, Verified by: [] Photo of bottle [] Copy of rx [] Dispensing       pharmacy [] Prescribing       provider [] Other (specify):                    Date started:        / /             MM DD YYYY Who prescribed this medication for you? [] Cardiology provider  [] Study clinic [] Outside clinic [] Endocrinology provider [] Primary care provider [] Other provider  Specify:                             [] Unknown Date discontinued:        / /             MM DD YYYY Why did you stop taking this medication? (check all that apply) [] Allergic reaction [] Medication side effects [] Unable to      adhere/monitor [] Had an    operation/procedure    that required stopping it [] Patient unable to        afford it [] Patient no longer        wants to take this        medication [] Provider decision [] Pregnancy [] Other (specify: ) [] Unknown Reason   If started or changed  What medication are you taking  now? [] Albiglutide (Tanzeum) [] Dulaglutide (Trulicity) [] Exanatide (Byetta, Bydureon) [] Liraglutide (Victoza, Saxenda) [] Lixisenatide (Adlyxin) [] Semaglutice (Ozempic)     10/02/2018 (EDC Release)

## 2019-09-06 DIAGNOSIS — G4733 Obstructive sleep apnea (adult) (pediatric): Secondary | ICD-10-CM | POA: Diagnosis not present

## 2019-09-07 ENCOUNTER — Encounter (HOSPITAL_COMMUNITY): Payer: Self-pay | Admitting: Emergency Medicine

## 2019-09-07 ENCOUNTER — Other Ambulatory Visit: Payer: Self-pay

## 2019-09-07 ENCOUNTER — Emergency Department (HOSPITAL_COMMUNITY): Payer: Medicare HMO

## 2019-09-07 ENCOUNTER — Inpatient Hospital Stay (HOSPITAL_COMMUNITY)
Admission: EM | Admit: 2019-09-07 | Discharge: 2019-09-10 | DRG: 247 | Disposition: A | Discharge status: Home / Self Care | Payer: Medicare HMO | Attending: Cardiovascular Disease | Admitting: Cardiovascular Disease

## 2019-09-07 DIAGNOSIS — Z20822 Contact with and (suspected) exposure to covid-19: Secondary | ICD-10-CM | POA: Diagnosis not present

## 2019-09-07 DIAGNOSIS — Z79899 Other long term (current) drug therapy: Secondary | ICD-10-CM | POA: Diagnosis not present

## 2019-09-07 DIAGNOSIS — E7800 Pure hypercholesterolemia, unspecified: Secondary | ICD-10-CM | POA: Diagnosis not present

## 2019-09-07 DIAGNOSIS — Z7982 Long term (current) use of aspirin: Secondary | ICD-10-CM | POA: Diagnosis not present

## 2019-09-07 DIAGNOSIS — I2510 Atherosclerotic heart disease of native coronary artery without angina pectoris: Secondary | ICD-10-CM | POA: Diagnosis not present

## 2019-09-07 DIAGNOSIS — E785 Hyperlipidemia, unspecified: Secondary | ICD-10-CM | POA: Diagnosis not present

## 2019-09-07 DIAGNOSIS — G4730 Sleep apnea, unspecified: Secondary | ICD-10-CM | POA: Diagnosis present

## 2019-09-07 DIAGNOSIS — Z955 Presence of coronary angioplasty implant and graft: Secondary | ICD-10-CM | POA: Diagnosis not present

## 2019-09-07 DIAGNOSIS — E119 Type 2 diabetes mellitus without complications: Secondary | ICD-10-CM | POA: Diagnosis not present

## 2019-09-07 DIAGNOSIS — Z888 Allergy status to other drugs, medicaments and biological substances status: Secondary | ICD-10-CM | POA: Diagnosis not present

## 2019-09-07 DIAGNOSIS — I214 Non-ST elevation (NSTEMI) myocardial infarction: Principal | ICD-10-CM | POA: Diagnosis present

## 2019-09-07 DIAGNOSIS — R61 Generalized hyperhidrosis: Secondary | ICD-10-CM | POA: Diagnosis not present

## 2019-09-07 DIAGNOSIS — R079 Chest pain, unspecified: Secondary | ICD-10-CM | POA: Diagnosis not present

## 2019-09-07 DIAGNOSIS — R3911 Hesitancy of micturition: Secondary | ICD-10-CM | POA: Diagnosis present

## 2019-09-07 DIAGNOSIS — Z7984 Long term (current) use of oral hypoglycemic drugs: Secondary | ICD-10-CM

## 2019-09-07 DIAGNOSIS — K219 Gastro-esophageal reflux disease without esophagitis: Secondary | ICD-10-CM | POA: Diagnosis not present

## 2019-09-07 DIAGNOSIS — I252 Old myocardial infarction: Secondary | ICD-10-CM

## 2019-09-07 DIAGNOSIS — Z8249 Family history of ischemic heart disease and other diseases of the circulatory system: Secondary | ICD-10-CM | POA: Diagnosis not present

## 2019-09-07 DIAGNOSIS — E782 Mixed hyperlipidemia: Secondary | ICD-10-CM | POA: Diagnosis not present

## 2019-09-07 DIAGNOSIS — I10 Essential (primary) hypertension: Secondary | ICD-10-CM | POA: Diagnosis not present

## 2019-09-07 DIAGNOSIS — E1159 Type 2 diabetes mellitus with other circulatory complications: Secondary | ICD-10-CM | POA: Diagnosis not present

## 2019-09-07 DIAGNOSIS — I259 Chronic ischemic heart disease, unspecified: Secondary | ICD-10-CM | POA: Diagnosis present

## 2019-09-07 DIAGNOSIS — R0789 Other chest pain: Secondary | ICD-10-CM | POA: Diagnosis not present

## 2019-09-07 LAB — CBC
HCT: 41.6 % (ref 39.0–52.0)
Hemoglobin: 13.8 g/dL (ref 13.0–17.0)
MCH: 29.9 pg (ref 26.0–34.0)
MCHC: 33.2 g/dL (ref 30.0–36.0)
MCV: 90 fL (ref 80.0–100.0)
Platelets: 232 10*3/uL (ref 150–400)
RBC: 4.62 MIL/uL (ref 4.22–5.81)
RDW: 12.2 % (ref 11.5–15.5)
WBC: 9.1 10*3/uL (ref 4.0–10.5)
nRBC: 0 % (ref 0.0–0.2)

## 2019-09-07 LAB — BASIC METABOLIC PANEL
Anion gap: 13 (ref 5–15)
BUN: 28 mg/dL — ABNORMAL HIGH (ref 8–23)
CO2: 20 mmol/L — ABNORMAL LOW (ref 22–32)
Calcium: 9.5 mg/dL (ref 8.9–10.3)
Chloride: 102 mmol/L (ref 98–111)
Creatinine, Ser: 1.46 mg/dL — ABNORMAL HIGH (ref 0.61–1.24)
GFR calc Af Amer: 57 mL/min — ABNORMAL LOW (ref >60)
GFR calc non Af Amer: 49 mL/min — ABNORMAL LOW (ref >60)
Glucose, Bld: 140 mg/dL — ABNORMAL HIGH (ref 70–99)
Potassium: 5.2 mmol/L — ABNORMAL HIGH (ref 3.5–5.1)
Sodium: 135 mmol/L (ref 135–145)

## 2019-09-07 LAB — SARS CORONAVIRUS 2 (TAT 6-24 HRS): SARS Coronavirus 2: NEGATIVE

## 2019-09-07 LAB — TROPONIN I (HIGH SENSITIVITY)
Troponin I (High Sensitivity): 39 ng/L — ABNORMAL HIGH (ref <18)
Troponin I (High Sensitivity): 222 ng/L (ref <18)

## 2019-09-07 LAB — HEPARIN LEVEL (UNFRACTIONATED): Heparin Unfractionated: 0.49 IU/mL (ref 0.30–0.70)

## 2019-09-07 MED ORDER — NITROGLYCERIN IN D5W 200-5 MCG/ML-% IV SOLN
0.0000 ug/min | INTRAVENOUS | Status: DC
  Administered 2019-09-07: 5 ug/min via INTRAVENOUS
  Filled 2019-09-07: qty 250

## 2019-09-07 MED ORDER — HEPARIN (PORCINE) 25000 UT/250ML-% IV SOLN
900.0000 [IU]/h | INTRAVENOUS | Status: DC
  Administered 2019-09-07 – 2019-09-08 (×2): 900 [IU]/h via INTRAVENOUS
  Filled 2019-09-07 (×2): qty 250

## 2019-09-07 MED ORDER — HEPARIN BOLUS VIA INFUSION
4000.0000 [IU] | Freq: Once | INTRAVENOUS | Status: AC
  Administered 2019-09-07: 4000 [IU] via INTRAVENOUS
  Filled 2019-09-07: qty 4000

## 2019-09-07 MED ORDER — NITROGLYCERIN 2 % TD OINT
1.0000 [in_us] | TOPICAL_OINTMENT | Freq: Once | TRANSDERMAL | Status: AC
  Administered 2019-09-07: 16:00:00 1 [in_us] via TOPICAL
  Filled 2019-09-07: qty 1

## 2019-09-07 MED ORDER — SODIUM CHLORIDE 0.9 % IV BOLUS
1000.0000 mL | Freq: Once | INTRAVENOUS | Status: AC
  Administered 2019-09-07: 1000 mL via INTRAVENOUS

## 2019-09-07 NOTE — ED Provider Notes (Signed)
[facility] EMERGENCY DEPARTMENT Provider Note   CSN: [id number] Arrival date & time: 09/07/19  1405  History Chief Complaint  Patient presents with  . Chest Pain   [name] is a 69 y/o male, with a PMH of CAD, GERd, MI, HLD, HTN, and apnea, who presents to [facility] with chest pain. Patient states that his symptoms began on 09/07/19 at 1000 while outside clearing trees. Patient states that his pain is "tearing" in description with radiation to the L upper extremity. Patient states that he took an aspirin and 2 nitro 30 minutes apart with no relief. Patient states that after the 3rd nitro dosage of nitro his pain began to alleviate to a 2/10 on the pain scale. Patient states that he has a PMH of MI and that his pain today did not feel consistent with his previous MI, which felt more like "my chest was exploding." Patient states that preceding his chest pain his wrist blood pressure monitor showed systolic pressures in the 160-170s and a headache. Patient denies changes in vision, LOC, diaphoresis, fevers, nausea, vomiting, abdominal pain, edema, constipation/diarrhea, recent viral infections, long hours traveling, or sedentary life style.     Past Medical History:  Diagnosis Date  . Complication of anesthesia    "I'm slow to come out"  . Coronary artery disease 2015   PTCA with DES to Cx; PCI  BMS 2004  . GERD (gastroesophageal reflux disease)   . H/O hiatal hernia   . History of myocardial infarction    with a stent placed at that time  . Hypercholesterolemia    with intolerant ot many of the cholesterol medicaitons  . Hypertension    essentai hypertension  . Ischemic heart disease   . Myocardial infarction [facility]) 2004   with a stent placed at that time  . Nephrosis  1960's  . Sleep apnea    "suppose to wear mask; I don't wear it enough" (11/15/2013)  . Type II diabetes mellitus [facility])     Patient Active Problem List   Diagnosis Date Noted  . NSTEMI  (non-ST elevated myocardial infarction) ([place]) 09/07/2019  . History of colonic polyps 11/14/2014  . Unstable angina ([place]) 11/15/2013  . Angina pectoris ([place]) 11/13/2013  . Malaise and fatigue 02/18/2013  . Coronary artery disease   . DM II (diabetes mellitus, type II), controlled ([place])   . Hypercholesterolemia   . Ischemic heart disease   . Hypertension     Past Surgical History:  Procedure Laterality Date  . BACK SURGERY    . COLONOSCOPY  10/07/2011   [name]:multiple colonic polyps removed/colonic diverticulosis, tubular adenoma  . COLONOSCOPY N/A 12/08/2014   Procedure: COLONOSCOPY;  Surgeon: [name], MD;  [facility];  Service: Endoscopy;  Laterality: N/A;  930  . CORONARY ANGIOPLASTY WITH STENT PLACEMENT  09/25/2002   EF-approximately 45% / stent placement to the mid arteriovenous circumflex with reduction of 99% narrowing to 0% with placement of a 3.0 x 16 mm Express II stent with improvement of TIMI grade 2 to TIMI grade 3 flow.  . CORONARY ANGIOPLASTY WITH STENT PLACEMENT  11/15/2013   "1"  . CORONARY STENT INTERVENTION N/A 07/03/2017   Procedure: CORONARY STENT INTERVENTION;  Surgeon: Nelva Bush, MD;  Location: [place] CV LAB;  Service: Cardiovascular;  Laterality: N/A;  . LEFT HEART CATHETERIZATION WITH CORONARY ANGIOGRAM N/A 11/15/2013   Procedure: LEFT HEART CATHETERIZATION WITH CORONARY ANGIOGRAM;  Surgeon: Burnell Blanks, MD;  Location: [place] CATH LAB;  Service: Cardiovascular;  Laterality: N/A;  . LUMBAR [place]  . NASAL SEPTUM SURGERY  02/20/2001   Septal reconstruction and turbinate reduction  . RIGHT/LEFT HEART CATH AND CORONARY ANGIOGRAPHY N/A 07/03/2017   Procedure: RIGHT/LEFT HEART CATH AND CORONARY ANGIOGRAPHY;  Surgeon: Nelva Bush, MD;  Location: [place] CV LAB;  Service: Cardiovascular;  Laterality: N/A;      Family History  Problem Relation Age of Onset  . Coronary artery disease Father        with  multiple angioplasties  . Healthy Sister   . Hypertension Other        runs in the family  . Colon cancer Neg Hx     Social History   Tobacco Use  . Smoking status: Never Smoker  . Smokeless tobacco: Never Used  . Tobacco comment: Never smoker  Substance Use Topics  . Alcohol use: No    Alcohol/week: 0.0 standard drinks  . Drug use: No    Home Medications Prior to Admission medications   Medication Sig Start Date End Date Taking? Authorizing Provider  Alirocumab (PRALUENT) 150 MG/ML SOAJ Inject 1 pen into the skin every 14 (fourteen) days. 08/05/19  Yes Lelon Perla, MD  aspirin 81 MG tablet Take 81 mg by mouth daily.     Yes [provider]  carvedilol (COREG) 12.5 MG tablet Take 1 tablet (12.5 mg total) by mouth 2 (two) times daily with a meal. Please keep upcoming appointment 07/31/19  Yes Lelon Perla, MD  glimepiride (AMARYL) 1 MG tablet Take 2 tablets by mouth daily as needed (blood glucose levels).  04/10/16  Yes [provider]  metFORMIN (GLUCOPHAGE) 1000 MG tablet Take 1 tablet (1,000 mg total) by mouth 2 (two) times daily with a meal. 02/02/15  Yes [name], MD  nitroGLYCERIN (NITROSTAT) 0.4 MG SL tablet Place 1 tablet (0.4 mg total) under the tongue every 5 (five) minutes as needed for chest pain. 04/26/16  Yes Lelon Perla, MD  omega-3 acid ethyl esters (LOVAZA) 1 g capsule TAKE 2 CAPSULES BY MOUTH TWICE A DAY Patient taking differently: Take 2 g by mouth 2 (two) times daily.  03/19/18  Yes Lelon Perla, MD  pantoprazole (PROTONIX) 40 MG tablet Take 1 tablet (40 mg total) by mouth daily. 12/04/15  Yes Lelon Perla, MD  tamsulosin (FLOMAX) 0.4 MG CAPS capsule Take 0.4 mg by mouth daily.   Yes [provider]  valsartan (DIOVAN) 160 MG tablet Take 1 tablet (160 mg total) by mouth daily. 10/02/18  Yes Lelon Perla, MD  Omeprazole Magnesium (PRILOSEC OTC PO) Take 1 capsule by mouth daily. Taking  Every other day   10/06/11  [provider]    Allergies    Statins and Crestor [rosuvastatin calcium]  ROS:  Negative with exception to findings in the HPI.  Physical Exam Updated Vital Signs BP (!) 130/95   Pulse 78   Temp 98.2 F (36.8 C)   Resp 19   Ht 5\' 10"  (1.778 m)   Wt 76.2 kg   SpO2 99%   BMI 24.11 kg/m   Physical Exam Constitutional:      Appearance: He is well-developed. He is not ill-appearing or diaphoretic.  HENT:     Head: Normocephalic and atraumatic.  Cardiovascular:     Rate and Rhythm: Normal rate and regular rhythm.     Heart sounds: Normal heart sounds. Heart sounds not distant. No murmur.  No systolic murmur. No diastolic murmur. No friction rub. No gallop.   Pulmonary:     Effort: Pulmonary effort is normal. No tachypnea or respiratory distress.     Breath sounds: Normal breath sounds. No stridor. No wheezing, rhonchi or rales.  Chest:     Chest wall: No tenderness.  Abdominal:     General: Bowel sounds are normal.     Palpations: Abdomen is soft.     Tenderness: There is no abdominal tenderness. There is no guarding.  Musculoskeletal:     Right lower leg: No tenderness. No edema.     Left lower leg: No tenderness. No edema.  Skin:    General: Skin is warm and dry.  Neurological:     Mental Status: He is alert and oriented to person, place, and time.  Psychiatric:        Mood and Affect: Mood normal.        Behavior: Behavior normal.     ED Results / Procedures / Treatments   Labs (all labs ordered are listed, but only abnormal results are displayed) Labs Reviewed  BASIC METABOLIC PANEL - Abnormal; Notable for the following components:      Result Value   Potassium 5.2 (*)    CO2 20 (*)    Glucose, Bld 140 (*)    BUN 28 (*)    Creatinine, Ser 1.46 (*)    GFR calc non Af Amer 49 (*)    GFR calc Af Amer 57 (*)    All other components within normal limits  TROPONIN I (HIGH SENSITIVITY) - Abnormal; Notable for the following components:    Troponin I (High Sensitivity) 39 (*)    All other components within normal limits  CBC  HEPARIN LEVEL (UNFRACTIONATED)  HEPARIN LEVEL (UNFRACTIONATED)  CBC  TROPONIN I (HIGH SENSITIVITY)    EKG EKG Interpretation  Date/Time:  Saturday September 07 2019 14:11:58 EST Ventricular Rate:  83 PR Interval:    QRS Duration: 95 QT Interval:  354 QTC Calculation: 416 R Axis:   162 Text Interpretation: Sinus rhythm S1,S2,S3 pattern ST elevation suggests acute pericarditis No significant change since last tracing Confirmed by Theotis Burrow [phone]) on 09/07/2019 2:13:22 PM   Radiology DG Chest 2 View  Result Date: 09/07/2019 CLINICAL DATA:  Chest pain, swelling, tingling down the left arm. EXAM: CHEST - 2 VIEW COMPARISON:  Chest radiograph 06/30/2017 FINDINGS: The heart size and mediastinal contours are within normal limits. The lungs are clear. No pneumothorax or pleural effusion. Multilevel degenerative disc disease in the thoracic spine. IMPRESSION: No active cardiopulmonary disease. Electronically Signed   By: Audie Pinto M.D.   On: 09/07/2019 14:55    Procedures Procedures (including critical care time)   Medications Ordered in ED Medications  nitroGLYCERIN 50 mg in dextrose 5 % 250 mL (0.2 mg/mL) infusion (5 mcg/min Intravenous New Bag/Given 09/07/19 1723)  heparin ADULT infusion 100 units/mL (25000 units/220mL sodium chloride 0.45%) (900 Units/hr Intravenous New Bag/Given 09/07/19 1725)  sodium chloride 0.9 % bolus 1,000 mL (0 mLs Intravenous Stopped 09/07/19 1721)  nitroGLYCERIN (NITROGLYN) 2 % ointment 1 inch (1 inch Topical Given 09/07/19 1601)  heparin bolus via infusion 4,000 Units (4,000 Units Intravenous Bolus from Bag 09/07/19 1725)    ED Course  I have reviewed the triage vital signs and the nursing notes.  Pertinent labs & imaging results that were available during my care of the patient were reviewed by me and considered in my medical decision making (see chart  for  details).  [name] is a 69 y/o with a PMH of CAD, GERD, HTN, HLD, and MI, who presents to the [facility] with chest pain.   Initially patient with a PMH of MI with stenting presents with chest pain after working in the yard. Patient describes pain as tearing with radiation to the L arm. Labs, CXR, and EKG were ordered to r/o pneumothorax vs MI vs aortic dissection.   CXR is unrevealing for pneumothorax or aortic dissection. EKG is unchanged from previous readings. Patient's BMP does show and increase in BUN, Creatinine, and slightly elevated hyperkalemia. Patient with complaints of slight chest pain. Labs reveal a prerenal cause and patient ordered 1L NS bolus. Additionally nitro paste was ordered for chest pain. Troponin shows an elevation of 39. Concerns for NSTEMI as bump in enzymes with no appreciable changes on  Final Clinical Impression(s) / ED Diagnoses Final diagnoses:  NSTEMI (non-ST elevated myocardial infarction) [facility])    Rx / Carpenter Orders ED Discharge Orders    None       Maudie Mercury, MD 09/07/19 1758    Rex Kras, Wenda Overland, MD 09/08/19 [phone]

## 2019-09-07 NOTE — ED Notes (Signed)
Meal tray ordered at 14:48

## 2019-09-07 NOTE — ED Notes (Signed)
Pt to xray

## 2019-09-07 NOTE — ED Notes (Signed)
Attempted report x1. 

## 2019-09-07 NOTE — ED Triage Notes (Signed)
Per ems, pt c/o chest pain around 10am this morning, was outside working in the yard, states started feeling a little sweaty and had center chest pain and tingling down the L arm. Pt was given 324 asa and 2 nitro, pain went down to a 6, then a 3rd nitro and pain is at around a 2/10. Pt hx of 2 MIs and 3 stents, diabetes, htn.

## 2019-09-07 NOTE — ED Notes (Signed)
No chest pain, no discomfort. Nitro patch removed.

## 2019-09-07 NOTE — Progress Notes (Signed)
ANTICOAGULATION CONSULT NOTE - Initial Consult  Pharmacy Consult for Heparin Indication: chest pain/ACS  Allergies  Allergen Reactions  . Statins Other (See Comments)    Joint pain   . Crestor [Rosuvastatin Calcium]     Myalgias    Patient Measurements: Height: 5\' 10"  (177.8 cm) Weight: 168 lb (76.2 kg) IBW/kg (Calculated) : 73 Heparin Dosing Weight: 76.2 kg  Vital Signs: Temp: 98.2 F (36.8 C) (02/13 1415) BP: 148/92 (02/13 1630) Pulse Rate: 75 (02/13 1630)  Labs: Recent Labs    09/07/19 1433  HGB 13.8  HCT 41.6  PLT 232  CREATININE 1.46*  TROPONINIHS 39*    Estimated Creatinine Clearance: 50.7 mL/min (A) (by C-G formula based on SCr of 1.46 mg/dL (H)).   Medical History: Past Medical History:  Diagnosis Date  . Complication of anesthesia    "I'm slow to come out"  . Coronary artery disease 2015   PTCA with DES to Cx; PCI  BMS 2004  . GERD (gastroesophageal reflux disease)   . H/O hiatal hernia   . History of myocardial infarction    with a stent placed at that time  . Hypercholesterolemia    with intolerant ot many of the cholesterol medicaitons  . Hypertension    essentai hypertension  . Ischemic heart disease   . Myocardial infarction [facility]) 2004   with a stent placed at that time  . Nephrosis  1960's  . Sleep apnea    "suppose to wear mask; I don't wear it enough" (11/15/2013)  . Type II diabetes mellitus (HCC)     Medications:  Scheduled:  . heparin  4,000 Units Intravenous Once    Assessment: Patient is a 51 yom that presents to the ED with c/o chest pain. The patient has a HX of a previous MI requiring stents. Pharmacy has been asked to dose heparin for ACS as the patients initial trop is slightly elevated.   Goal of Therapy:  Heparin level 0.3-0.7 units/ml Monitor platelets by anticoagulation protocol: Yes   Plan:  - Heparin 4000 units IV x 1 dose  - Followed by Heparin drip @ 900 units/hr  - Heparin level in ~ 6 hours  - Monitor  patient for s/s of bleeding and cbc while on heparin  Duanne Limerick PharmD. BCPS  09/07/2019,4:41 PM

## 2019-09-07 NOTE — ED Notes (Signed)
Drue Flirt family friend will provide ride home if d/c.  ph#: [phone]

## 2019-09-07 NOTE — H&P (Signed)
Physician History and Physical     Patient ID: Eric Ball MRN: [id number] DOB/AGE: 1951/02/11 69 y.o. Admit date: 09/07/2019  Primary Care Physician: Redmond School, MD Primary Cardiologist: Stanford Breed  Active Problems:   * No active hospital problems. *   HPI:  69 y.o. known history of CAD, HLD on Praluent, DM and HTN.  Lateral wall infarct 2004 with DES to circumflex. Restenosis with PCI in 2015 December 2018 recurrent angina with stent to DES to RCA. A lot of his anginal symptoms have been fatigue and dyspnea . Has had more of both last 3 weeks. This am overdid it clearing ice at home and had SSCP radiating to arm with diaphoresis.  Pain eased with ASA and 3 nitro No pain free in ER.  Initial troponin positive 36 ECG no acute changes CXR NAD Compliant with meds. EF was normal by last echo 60-65% 07/01/17  Review of systems complete and found to be negative unless listed above   Past Medical History:  Diagnosis Date  . Complication of anesthesia    "I'm slow to come out"  . Coronary artery disease 2015   PTCA with DES to Cx; PCI  BMS 2004  . GERD (gastroesophageal reflux disease)   . H/O hiatal hernia   . History of myocardial infarction    with a stent placed at that time  . Hypercholesterolemia    with intolerant ot many of the cholesterol medicaitons  . Hypertension    essentai hypertension  . Ischemic heart disease   . Myocardial infarction [facility]) 2004   with a stent placed at that time  . Nephrosis  1960's  . Sleep apnea    "suppose to wear mask; I don't wear it enough" (11/15/2013)  . Type II diabetes mellitus (HCC)     Family History  Problem Relation Age of Onset  . Coronary artery disease Father        with multiple angioplasties  . Healthy Sister   . Hypertension Other        runs in the family  . Colon cancer Neg Hx     Social History   Socioeconomic History  . Marital status: Married    Spouse name: Not on file  . Number of children: Not on file   . Years of education: Not on file  . Highest education level: Not on file  Occupational History  . Not on file  Tobacco Use  . Smoking status: Never Smoker  . Smokeless tobacco: Never Used  . Tobacco comment: Never smoker  Substance and Sexual Activity  . Alcohol use: No    Alcohol/week: 0.0 standard drinks  . Drug use: No  . Sexual activity: Yes  Other Topics Concern  . Not on file  Social History Narrative  . Not on file   Social Determinants of Health   Financial Resource Strain:   . Difficulty of Paying Living Expenses: Not on file  Food Insecurity:   . Worried About Charity fundraiser in the Last Year: Not on file  . Ran Out of Food in the Last Year: Not on file  Transportation Needs:   . Lack of Transportation (Medical): Not on file  . Lack of Transportation (Non-Medical): Not on file  Physical Activity:   . Days of Exercise per Week: Not on file  . Minutes of Exercise per Session: Not on file  Stress:   . Feeling of Stress : Not on file  Social Connections:   .  Frequency of Communication with Friends and Family: Not on file  . Frequency of Social Gatherings with Friends and Family: Not on file  . Attends Religious Services: Not on file  . Active Member of Clubs or Organizations: Not on file  . Attends Archivist Meetings: Not on file  . Marital Status: Not on file  Intimate Partner Violence:   . Fear of Current or Ex-Partner: Not on file  . Emotionally Abused: Not on file  . Physically Abused: Not on file  . Sexually Abused: Not on file    Past Surgical History:  Procedure Laterality Date  . BACK SURGERY    . COLONOSCOPY  10/07/2011   [name]:multiple colonic polyps removed/colonic diverticulosis, tubular adenoma  . COLONOSCOPY N/A 12/08/2014   Procedure: COLONOSCOPY;  Surgeon: [name], MD;  [facility];  Service: Endoscopy;  Laterality: N/A;  930  . CORONARY ANGIOPLASTY WITH STENT PLACEMENT  09/25/2002   EF-approximately 45%  / stent placement to the mid arteriovenous circumflex with reduction of 99% narrowing to 0% with placement of a 3.0 x 16 mm Express II stent with improvement of TIMI grade 2 to TIMI grade 3 flow.  . CORONARY ANGIOPLASTY WITH STENT PLACEMENT  11/15/2013   "1"  . CORONARY STENT INTERVENTION N/A 07/03/2017   Procedure: CORONARY STENT INTERVENTION;  Surgeon: Nelva Bush, MD;  Location: [place] CV LAB;  Service: Cardiovascular;  Laterality: N/A;  . LEFT HEART CATHETERIZATION WITH CORONARY ANGIOGRAM N/A 11/15/2013   Procedure: LEFT HEART CATHETERIZATION WITH CORONARY ANGIOGRAM;  Surgeon: Burnell Blanks, MD;  Location: [facility] CATH LAB;  Service: Cardiovascular;  Laterality: N/A;  . LUMBAR [place]  . NASAL SEPTUM SURGERY  02/20/2001   Septal reconstruction and turbinate reduction  . RIGHT/LEFT HEART CATH AND CORONARY ANGIOGRAPHY N/A 07/03/2017   Procedure: RIGHT/LEFT HEART CATH AND CORONARY ANGIOGRAPHY;  Surgeon: Nelva Bush, MD;  Location: [place] CV LAB;  Service: Cardiovascular;  Laterality: N/A;     (Not in a hospital admission)   Physical Exam: Blood pressure (!) 148/92, pulse 75, temperature 98.2 F (36.8 C), resp. rate 11, height 5\' 10"  (1.778 m), weight 76.2 kg, SpO2 99 %.   Affect appropriate Healthy:  appears stated age 69: normal Neck supple with no adenopathy JVP normal no bruits no thyromegaly Lungs clear with no wheezing and good diaphragmatic motion Heart:  S1/S2 no murmur, no rub, gallop or click PMI normal Abdomen: benighn, BS positve, no tenderness, no AAA no bruit.  No HSM or HJR Distal pulses intact with no bruits No edema Neuro non-focal Skin warm and dry No muscular weakness  No current facility-administered medications on file prior to encounter.   Current Outpatient Medications on File Prior to Encounter  Medication Sig Dispense Refill  . Alirocumab (PRALUENT) 150 MG/ML SOAJ Inject 1 pen into the skin every 14 (fourteen) days.  6 pen 3  . aspirin 81 MG tablet Take 81 mg by mouth daily.      . carvedilol (COREG) 12.5 MG tablet Take 1 tablet (12.5 mg total) by mouth 2 (two) times daily with a meal. Please keep upcoming appointment 180 tablet 1  . glimepiride (AMARYL) 1 MG tablet Take 2 tablets by mouth daily as needed (blood glucose levels).     . metFORMIN (GLUCOPHAGE) 1000 MG tablet Take 1 tablet (1,000 mg total) by mouth 2 (two) times daily with a meal. 180 tablet 3  . nitroGLYCERIN (NITROSTAT) 0.4 MG SL tablet Place 1  tablet (0.4 mg total) under the tongue every 5 (five) minutes as needed for chest pain. 25 tablet 3  . omega-3 acid ethyl esters (LOVAZA) 1 g capsule TAKE 2 CAPSULES BY MOUTH TWICE A DAY (Patient taking differently: Take 2 g by mouth 2 (two) times daily. ) 360 capsule 0  . pantoprazole (PROTONIX) 40 MG tablet Take 1 tablet (40 mg total) by mouth daily. 90 tablet 3  . tamsulosin (FLOMAX) 0.4 MG CAPS capsule Take 0.4 mg by mouth daily.    . valsartan (DIOVAN) 160 MG tablet Take 1 tablet (160 mg total) by mouth daily. 90 tablet 3  . [DISCONTINUED] Omeprazole Magnesium (PRILOSEC OTC PO) Take 1 capsule by mouth daily. Taking  Every other day      Labs:   Lab Results  Component Value Date   WBC 9.1 09/07/2019   HGB 13.8 09/07/2019   HCT 41.6 09/07/2019   MCV 90.0 09/07/2019   PLT 232 09/07/2019    Recent Labs  Lab 09/07/19 1433  NA 135  K 5.2*  CL 102  CO2 20*  BUN 28*  CREATININE 1.46*  CALCIUM 9.5  GLUCOSE 140*   No results found for: CKTOTAL, CKMB, CKMBINDEX, TROPONINI   Lab Results  Component Value Date   CHOL 150 07/09/2018   CHOL 160 09/19/2017   CHOL 251 (H) 07/01/2017   Lab Results  Component Value Date   HDL 47 07/09/2018   HDL 52 09/19/2017   HDL 39 (L) 07/01/2017   Lab Results  Component Value Date   LDLCALC 76 07/09/2018   LDLCALC 72 09/19/2017   LDLCALC 187 (H) 07/01/2017   Lab Results  Component Value Date   TRIG 135 07/09/2018   TRIG 179 (H) 09/19/2017    TRIG 126 07/01/2017   Lab Results  Component Value Date   CHOLHDL 3.2 07/09/2018   CHOLHDL 6.4 07/01/2017   CHOLHDL 3.8 08/08/2016   Lab Results  Component Value Date   LDLDIRECT 146.0 12/19/2014   LDLDIRECT 150.0 08/20/2014   LDLDIRECT 160.8 04/03/2014       Radiology: DG Chest 2 View  Result Date: 09/07/2019 CLINICAL DATA:  Chest pain, swelling, tingling down the left arm. EXAM: CHEST - 2 VIEW COMPARISON:  Chest radiograph 06/30/2017 FINDINGS: The heart size and mediastinal contours are within normal limits. The lungs are clear. No pneumothorax or pleural effusion. Multilevel degenerative disc disease in the thoracic spine. IMPRESSION: No active cardiopulmonary disease. Electronically Signed   By: Audie Pinto M.D.   On: 09/07/2019 14:55    ECG:  NSR no acute changes   ASSESSMENT AND PLAN:   1. Angina/SEMI:  Chest pain and positive troponin in patient with 3 previous stents 2 to the circumflex and one to RCA last in 2018. ECG non acute. Start heparin continue coreg and aspirin. Nitro paste applied by ER Discussed needing diagnostic cath Monday Risks including MI, bleeding stroke emergency surgery discussed. Has not had contrast reaction in past Willing to proceed. Last 2 caths done from right radial with no issues.   2. HLD:  Continue Praluent intolerant to statins   3. DM:  Hold oral hypoglycemics morning of cath   4. Prostate:  Continue flomax for hesitancy  Signed: Collier Salina Nishan2/13/2021, 4:53 PM

## 2019-09-07 NOTE — Progress Notes (Signed)
   Helped enter admission orders at the request of Dr. Johnsie Cancel: - Will admit for cath on Monday. - Start IV Heparin. - Ok to continue Nitropaste. - Will trend troponin. - Will check lipid panel and Hemolgobin A1c. - Continue home medications: Aspirin 81mg  daily, Coreg 12.5mg  twice daily, Irbesartan 150mg  daily (rather than home Valsartan), Lovaza 2g twice daily, Protonic 40mg  daily, and Tamsulosin.  - Will hold home Glimepiride and Metformin and start sliding scale insulin.  Darreld Mclean, PA-C 09/07/2019 5:27 PM

## 2019-09-07 NOTE — Progress Notes (Signed)
[place] for Heparin Indication: chest pain/ACS  Allergies  Allergen Reactions  . Statins Other (See Comments)    Joint pain   . Crestor [Rosuvastatin Calcium]     Myalgias    Patient Measurements: Height: 5\' 10"  (177.8 cm) Weight: 168 lb (76.2 kg) IBW/kg (Calculated) : 73 Heparin Dosing Weight: 76.2 kg  Vital Signs: Temp: 97.9 F (36.6 C) (02/13 2158) Temp Source: Oral (02/13 2158) BP: 117/73 (02/13 2158) Pulse Rate: 84 (02/13 2158)  Labs: Recent Labs    09/07/19 1433 09/07/19 1727 09/07/19 2245  HGB 13.8  --   --   HCT 41.6  --   --   PLT 232  --   --   HEPARINUNFRC  --   --  0.49  CREATININE 1.46*  --   --   TROPONINIHS 39* 222*  --     Estimated Creatinine Clearance: 50.7 mL/min (A) (by C-G formula based on SCr of 1.46 mg/dL (H)).   Medical History: Past Medical History:  Diagnosis Date  . Complication of anesthesia    "I'm slow to come out"  . Coronary artery disease 2015   PTCA with DES to Cx; PCI  BMS 2004  . GERD (gastroesophageal reflux disease)   . H/O hiatal hernia   . History of myocardial infarction    with a stent placed at that time  . Hypercholesterolemia    with intolerant ot many of the cholesterol medicaitons  . Hypertension    essentai hypertension  . Ischemic heart disease   . Myocardial infarction [facility]) 2004   with a stent placed at that time  . Nephrosis  1960's  . Sleep apnea    "suppose to wear mask; I don't wear it enough" (11/15/2013)  . Type II diabetes mellitus ([place])     Assessment: Patient is a 72 yom that presents to the ED with c/o chest pain. The patient has a HX of a previous MI requiring stents. Pharmacy has been asked to dose heparin for ACS as the patients initial trop is slightly elevated.   2/13 PM update:  Initial heparin level therapeutic  Slight bump in trop  Goal of Therapy:  Heparin level 0.3-0.7 units/ml Monitor platelets by anticoagulation protocol: Yes    Plan:  Cont heparin at 900 units/hr Confirmatory heparin level with AM labs  Narda Bonds, PharmD, [place] Pharmacist Phone: [phone]

## 2019-09-08 LAB — HEPARIN LEVEL (UNFRACTIONATED): Heparin Unfractionated: 0.39 IU/mL (ref 0.30–0.70)

## 2019-09-08 LAB — LIPID PANEL
Cholesterol: 151 mg/dL (ref 0–200)
HDL: 43 mg/dL (ref >40)
LDL Cholesterol: 69 mg/dL (ref 0–99)
Total CHOL/HDL Ratio: 3.5 RATIO
Triglycerides: 196 mg/dL — ABNORMAL HIGH (ref <150)
VLDL: 39 mg/dL (ref 0–40)

## 2019-09-08 LAB — BASIC METABOLIC PANEL
Anion gap: 11 (ref 5–15)
BUN: 19 mg/dL (ref 8–23)
CO2: 20 mmol/L — ABNORMAL LOW (ref 22–32)
Calcium: 9 mg/dL (ref 8.9–10.3)
Chloride: 105 mmol/L (ref 98–111)
Creatinine, Ser: 1.3 mg/dL — ABNORMAL HIGH (ref 0.61–1.24)
GFR calc Af Amer: >60 mL/min (ref >60)
GFR calc non Af Amer: 56 mL/min — ABNORMAL LOW (ref >60)
Glucose, Bld: 145 mg/dL — ABNORMAL HIGH (ref 70–99)
Potassium: 4.3 mmol/L (ref 3.5–5.1)
Sodium: 136 mmol/L (ref 135–145)

## 2019-09-08 LAB — CBC
HCT: 38.3 % — ABNORMAL LOW (ref 39.0–52.0)
Hemoglobin: 12.6 g/dL — ABNORMAL LOW (ref 13.0–17.0)
MCH: 29.7 pg (ref 26.0–34.0)
MCHC: 32.9 g/dL (ref 30.0–36.0)
MCV: 90.3 fL (ref 80.0–100.0)
Platelets: 205 10*3/uL (ref 150–400)
RBC: 4.24 MIL/uL (ref 4.22–5.81)
RDW: 12.3 % (ref 11.5–15.5)
WBC: 6.8 10*3/uL (ref 4.0–10.5)
nRBC: 0 % (ref 0.0–0.2)

## 2019-09-08 LAB — GLUCOSE, CAPILLARY
Glucose-Capillary: 184 mg/dL — ABNORMAL HIGH (ref 70–99)
Glucose-Capillary: 172 mg/dL — ABNORMAL HIGH (ref 70–99)
Glucose-Capillary: 168 mg/dL — ABNORMAL HIGH (ref 70–99)

## 2019-09-08 LAB — HEMOGLOBIN A1C
Hgb A1c MFr Bld: 7.4 % — ABNORMAL HIGH (ref 4.8–5.6)
Mean Plasma Glucose: 165.68 mg/dL

## 2019-09-08 LAB — HIV ANTIBODY (ROUTINE TESTING W REFLEX): HIV Screen 4th Generation wRfx: NONREACTIVE

## 2019-09-08 MED ORDER — SODIUM CHLORIDE 0.9% FLUSH: 3.0000 mL | Freq: Two times a day (BID) | INTRAVENOUS | Status: DC

## 2019-09-08 MED ORDER — SODIUM CHLORIDE 0.9 % WEIGHT BASED INFUSION
3.0000 mL/kg/h | INTRAVENOUS | Status: AC
  Administered 2019-09-09: 04:00:00 3 mL/kg/h via INTRAVENOUS

## 2019-09-08 MED ORDER — ASPIRIN EC 81 MG PO TBEC
81.0000 mg | DELAYED_RELEASE_TABLET | Freq: Every day | ORAL | Status: DC
  Administered 2019-09-08 – 2019-09-10 (×3): 81 mg via ORAL
  Filled 2019-09-08 (×3): qty 1

## 2019-09-08 MED ORDER — ONDANSETRON HCL 4 MG/2ML IJ SOLN: 4.0000 mg | Freq: Four times a day (QID) | INTRAMUSCULAR | Status: DC | PRN

## 2019-09-08 MED ORDER — TAMSULOSIN HCL 0.4 MG PO CAPS
0.4000 mg | ORAL_CAPSULE | Freq: Every day | ORAL | Status: DC
  Administered 2019-09-08 – 2019-09-10 (×3): 0.4 mg via ORAL
  Filled 2019-09-08 (×3): qty 1

## 2019-09-08 MED ORDER — INSULIN ASPART 100 UNIT/ML ~~LOC~~ SOLN
0.0000 [IU] | Freq: Three times a day (TID) | SUBCUTANEOUS | Status: DC
  Administered 2019-09-08 (×2): 2 [IU] via SUBCUTANEOUS
  Administered 2019-09-08 – 2019-09-09 (×2): 1 [IU] via SUBCUTANEOUS
  Administered 2019-09-09 – 2019-09-10 (×2): 2 [IU] via SUBCUTANEOUS

## 2019-09-08 MED ORDER — SODIUM CHLORIDE 0.9% FLUSH: 3.0000 mL | INTRAVENOUS | Status: DC | PRN

## 2019-09-08 MED ORDER — PANTOPRAZOLE SODIUM 40 MG PO TBEC
40.0000 mg | DELAYED_RELEASE_TABLET | Freq: Every day | ORAL | Status: DC
  Administered 2019-09-08 – 2019-09-10 (×3): 40 mg via ORAL
  Filled 2019-09-08 (×3): qty 1

## 2019-09-08 MED ORDER — CARVEDILOL 12.5 MG PO TABS
12.5000 mg | ORAL_TABLET | Freq: Two times a day (BID) | ORAL | Status: DC
  Administered 2019-09-08 – 2019-09-10 (×5): 12.5 mg via ORAL
  Filled 2019-09-08 (×5): qty 1

## 2019-09-08 MED ORDER — SODIUM CHLORIDE 0.9 % IV SOLN: 250.0000 mL | INTRAVENOUS | Status: DC | PRN

## 2019-09-08 MED ORDER — NITROGLYCERIN 0.4 MG SL SUBL: 0.4000 mg | SUBLINGUAL_TABLET | SUBLINGUAL | Status: DC | PRN

## 2019-09-08 MED ORDER — ACETAMINOPHEN 325 MG PO TABS
650.0000 mg | ORAL_TABLET | ORAL | Status: DC | PRN
  Administered 2019-09-09: 650 mg via ORAL
  Filled 2019-09-08: qty 2

## 2019-09-08 MED ORDER — OMEGA-3-ACID ETHYL ESTERS 1 G PO CAPS
2.0000 | ORAL_CAPSULE | Freq: Two times a day (BID) | ORAL | Status: DC
  Administered 2019-09-08 – 2019-09-10 (×5): 2 g via ORAL
  Filled 2019-09-08 (×5): qty 2

## 2019-09-08 MED ORDER — SODIUM CHLORIDE 0.9 % WEIGHT BASED INFUSION
1.0000 mL/kg/h | INTRAVENOUS | Status: DC
  Administered 2019-09-09 (×2): 1 mL/kg/h via INTRAVENOUS

## 2019-09-08 MED ORDER — IRBESARTAN 150 MG PO TABS
150.0000 mg | ORAL_TABLET | Freq: Every day | ORAL | Status: DC
  Administered 2019-09-08 – 2019-09-10 (×3): 150 mg via ORAL
  Filled 2019-09-08 (×3): qty 1

## 2019-09-08 NOTE — Progress Notes (Signed)
[place] for Heparin Indication: chest pain/ACS  Allergies  Allergen Reactions  . Statins Other (See Comments)    Joint pain   . Crestor [Rosuvastatin Calcium]     Myalgias    Patient Measurements: Height: 5\' 10"  (177.8 cm) Weight: 165 lb 8 oz (75.1 kg) IBW/kg (Calculated) : 73 Heparin Dosing Weight: 76.2 kg  Vital Signs: Temp: 98.5 F (36.9 C) (02/14 0806) Temp Source: Oral (02/14 0806) BP: 114/80 (02/14 0806) Pulse Rate: 81 (02/14 0806)  Labs: Recent Labs    09/07/19 1433 09/07/19 1727 09/07/19 2245 09/08/19 0453  HGB 13.8  --   --  12.6*  HCT 41.6  --   --  38.3*  PLT 232  --   --  205  HEPARINUNFRC  --   --  0.49 0.39  CREATININE 1.46*  --   --  1.30*  TROPONINIHS 39* 222*  --   --     Estimated Creatinine Clearance: 56.9 mL/min (A) (by C-G formula based on SCr of 1.3 mg/dL (H)).   Medical History: Past Medical History:  Diagnosis Date  . Complication of anesthesia    "I'm slow to come out"  . Coronary artery disease 2015   PTCA with DES to Cx; PCI  BMS 2004  . GERD (gastroesophageal reflux disease)   . H/O hiatal hernia   . History of myocardial infarction    with a stent placed at that time  . Hypercholesterolemia    with intolerant ot many of the cholesterol medicaitons  . Hypertension    essentai hypertension  . Ischemic heart disease   . Myocardial infarction [facility]) 2004   with a stent placed at that time  . Nephrosis  1960's  . Sleep apnea    "suppose to wear mask; I don't wear it enough" (11/15/2013)  . Type II diabetes mellitus ([place])     Assessment: Patient is a 47 yom that presents to the ED with c/o chest pain. The patient has a HX of a previous MI requiring stents. Pharmacy has been asked to dose heparin for ACS. Plans noted for cath on 2/15 -heparin level at goal   Goal of Therapy:  Heparin level 0.3-0.7 units/ml Monitor platelets by anticoagulation protocol: Yes   Plan:  -Continue  heparin at 900 units/hr -Daily heparin level and CBC  Hildred Laser, PharmD Clinical Pharmacist **Pharmacist phone directory can now be found on [email or web address] ([email or web address]).  Listed under [place].

## 2019-09-08 NOTE — Progress Notes (Signed)
Subjective:  Denies SSCP, palpitations or Dyspnea Eating breakfast   Objective:  Vitals:   09/07/19 2158 09/08/19 0027 09/08/19 0347 09/08/19 0806  BP: 117/73 112/70 130/79 114/80  Pulse: 84 79 75 81  Resp: 16 19 16 16   Temp: 97.9 F (36.6 C) 98.3 F (36.8 C) 97.7 F (36.5 C) 98.5 F (36.9 C)  TempSrc: Oral Oral Oral Oral  SpO2: 95% 95% 98% 96%  Weight:   75.1 kg   Height:        Intake/Output from previous day:  Intake/Output Summary (Last 24 hours) at 09/08/2019 0859 Last data filed at 09/08/2019 0400 Gross per 24 hour  Intake 1494.87 ml  Output --  Net 1494.87 ml    Physical Exam: Affect appropriate Healthy:  appears stated age HEENT: normal Neck supple with no adenopathy JVP normal no bruits no thyromegaly Lungs clear with no wheezing and good diaphragmatic motion Heart:  S1/S2 no murmur, no rub, gallop or click PMI normal Abdomen: benighn, BS positve, no tenderness, no AAA no bruit.  No HSM or HJR Distal pulses intact with no bruits No edema Neuro non-focal Skin warm and dry No muscular weakness   Lab Results: Basic Metabolic Panel: Recent Labs    09/07/19 1433 09/08/19 0453  NA 135 136  K 5.2* 4.3  CL 102 105  CO2 20* 20*  GLUCOSE 140* 145*  BUN 28* 19  CREATININE 1.46* 1.30*  CALCIUM 9.5 9.0   Liver Function Tests: No results for input(s): AST, ALT, ALKPHOS, BILITOT, PROT, ALBUMIN in the last 72 hours. No results for input(s): LIPASE, AMYLASE in the last 72 hours. CBC: Recent Labs    09/07/19 1433 09/08/19 0453  WBC 9.1 6.8  HGB 13.8 12.6*  HCT 41.6 38.3*  MCV 90.0 90.3  PLT 232 205   Cardiac Enzymes: No results for input(s): CKTOTAL, CKMB, CKMBINDEX, TROPONINI in the last 72 hours. BNP: Invalid input(s): POCBNP D-Dimer: No results for input(s): DDIMER in the last 72 hours. Hemoglobin A1C: Recent Labs    09/08/19 0453  HGBA1C 7.4*   Fasting Lipid Panel: Recent Labs    09/08/19 0453  CHOL 151  HDL 43  LDLCALC 69   TRIG 196*  CHOLHDL 3.5   Thyroid Function Tests: No results for input(s): TSH, T4TOTAL, T3FREE, THYROIDAB in the last 72 hours.  Invalid input(s): FREET3 Anemia Panel: No results for input(s): VITAMINB12, FOLATE, FERRITIN, TIBC, IRON, RETICCTPCT in the last 72 hours.  Imaging: DG Chest 2 View  Result Date: 09/07/2019 CLINICAL DATA:  Chest pain, swelling, tingling down the left arm. EXAM: CHEST - 2 VIEW COMPARISON:  Chest radiograph 06/30/2017 FINDINGS: The heart size and mediastinal contours are within normal limits. The lungs are clear. No pneumothorax or pleural effusion. Multilevel degenerative disc disease in the thoracic spine. IMPRESSION: No active cardiopulmonary disease. Electronically Signed   By: Audie Pinto M.D.   On: 09/07/2019 14:55    Cardiac Studies:  ECG:    Telemetry:  NSR 09/08/2019   Echo: 2018 EF 60-65%   Medications:   . aspirin EC  81 mg Oral Daily  . carvedilol  12.5 mg Oral BID WC  . insulin aspart  0-9 Units Subcutaneous TID WC  . irbesartan  150 mg Oral Daily  . omega-3 acid ethyl esters  2 capsule Oral BID  . pantoprazole  40 mg Oral Daily  . tamsulosin  0.4 mg Oral Daily     . heparin 900 Units/hr (09/07/19 1728)  . nitroGLYCERIN  5 mcg/min (09/07/19 1723)    Assessment/Plan:   1. Angina/SEMI:  Chest pain and positive troponin up to 222  in patient with 3 previous stents 2 to the circumflex and one to RCA last in 2018. ECG non acute. Start heparin continue coreg and aspirin. Nitro paste applied by ER Discussed needing diagnostic cath Monday Risks including MI, bleeding stroke emergency surgery discussed. Has not had contrast reaction in past Willing to proceed. Last 2 caths done from right radial with no issues.   2. HLD:  Continue Praluent intolerant to statins   3. DM:  Hold oral hypoglycemics morning of cath Discussed high A1c 7.4 with patient today   4. Prostate:  Continue flomax for hesitancy   Jenkins Rouge 09/08/2019, 8:59  AM

## 2019-09-09 ENCOUNTER — Encounter (HOSPITAL_COMMUNITY)
Admission: EM | Disposition: A | Discharge status: Home / Self Care | Payer: Self-pay | Source: Home / Self Care | Attending: Cardiovascular Disease

## 2019-09-09 ENCOUNTER — Other Ambulatory Visit: Payer: Self-pay

## 2019-09-09 ENCOUNTER — Encounter (HOSPITAL_COMMUNITY): Payer: Self-pay | Admitting: Cardiovascular Disease

## 2019-09-09 DIAGNOSIS — E1159 Type 2 diabetes mellitus with other circulatory complications: Secondary | ICD-10-CM

## 2019-09-09 DIAGNOSIS — I2510 Atherosclerotic heart disease of native coronary artery without angina pectoris: Secondary | ICD-10-CM

## 2019-09-09 DIAGNOSIS — E782 Mixed hyperlipidemia: Secondary | ICD-10-CM

## 2019-09-09 HISTORY — PX: LEFT HEART CATH AND CORONARY ANGIOGRAPHY: CATH118249

## 2019-09-09 HISTORY — PX: CORONARY STENT INTERVENTION: CATH118234

## 2019-09-09 LAB — BASIC METABOLIC PANEL
Anion gap: 12 (ref 5–15)
BUN: 15 mg/dL (ref 8–23)
CO2: 19 mmol/L — ABNORMAL LOW (ref 22–32)
Calcium: 9 mg/dL (ref 8.9–10.3)
Chloride: 102 mmol/L (ref 98–111)
Creatinine, Ser: 1.3 mg/dL — ABNORMAL HIGH (ref 0.61–1.24)
GFR calc Af Amer: >60 mL/min (ref >60)
GFR calc non Af Amer: 56 mL/min — ABNORMAL LOW (ref >60)
Glucose, Bld: 170 mg/dL — ABNORMAL HIGH (ref 70–99)
Potassium: 5 mmol/L (ref 3.5–5.1)
Sodium: 133 mmol/L — ABNORMAL LOW (ref 135–145)

## 2019-09-09 LAB — GLUCOSE, CAPILLARY
Glucose-Capillary: 152 mg/dL — ABNORMAL HIGH (ref 70–99)
Glucose-Capillary: 138 mg/dL — ABNORMAL HIGH (ref 70–99)
Glucose-Capillary: 112 mg/dL — ABNORMAL HIGH (ref 70–99)
Glucose-Capillary: 181 mg/dL — ABNORMAL HIGH (ref 70–99)

## 2019-09-09 LAB — POCT ACTIVATED CLOTTING TIME
Activated Clotting Time: 213 seconds
Activated Clotting Time: 268 s

## 2019-09-09 LAB — CBC
HCT: 38.1 % — ABNORMAL LOW (ref 39.0–52.0)
Hemoglobin: 12.9 g/dL — ABNORMAL LOW (ref 13.0–17.0)
MCH: 30.4 pg (ref 26.0–34.0)
MCHC: 33.9 g/dL (ref 30.0–36.0)
MCV: 89.9 fL (ref 80.0–100.0)
Platelets: 208 10*3/uL (ref 150–400)
RBC: 4.24 MIL/uL (ref 4.22–5.81)
RDW: 12.4 % (ref 11.5–15.5)
WBC: 6.3 10*3/uL (ref 4.0–10.5)
nRBC: 0 % (ref 0.0–0.2)

## 2019-09-09 LAB — HEPARIN LEVEL (UNFRACTIONATED): Heparin Unfractionated: 0.37 IU/mL (ref 0.30–0.70)

## 2019-09-09 SURGERY — LEFT HEART CATH AND CORONARY ANGIOGRAPHY
Anesthesia: LOCAL

## 2019-09-09 MED ORDER — TICAGRELOR 90 MG PO TABS
ORAL_TABLET | ORAL | Status: AC
  Filled 2019-09-09: qty 2

## 2019-09-09 MED ORDER — HEPARIN (PORCINE) IN NACL 1000-0.9 UT/500ML-% IV SOLN
INTRAVENOUS | Status: DC | PRN
  Administered 2019-09-09 (×2): 500 mL

## 2019-09-09 MED ORDER — IOHEXOL 350 MG/ML SOLN
INTRAVENOUS | Status: DC | PRN
  Administered 2019-09-09: 125 mL via INTRA_ARTERIAL

## 2019-09-09 MED ORDER — VERAPAMIL HCL 2.5 MG/ML IV SOLN
INTRAVENOUS | Status: AC
  Filled 2019-09-09: qty 2

## 2019-09-09 MED ORDER — FENTANYL CITRATE (PF) 100 MCG/2ML IJ SOLN
INTRAMUSCULAR | Status: DC | PRN
  Administered 2019-09-09 (×2): 25 ug via INTRAVENOUS

## 2019-09-09 MED ORDER — HYDRALAZINE HCL 20 MG/ML IJ SOLN: 10.0000 mg | INTRAMUSCULAR | Status: AC | PRN

## 2019-09-09 MED ORDER — VERAPAMIL HCL 2.5 MG/ML IV SOLN
INTRAVENOUS | Status: DC | PRN
  Administered 2019-09-09: 15:00:00 10 mL via INTRA_ARTERIAL

## 2019-09-09 MED ORDER — NITROGLYCERIN 1 MG/10 ML FOR IR/CATH LAB
INTRA_ARTERIAL | Status: AC
  Filled 2019-09-09: qty 10

## 2019-09-09 MED ORDER — MIDAZOLAM HCL 2 MG/2ML IJ SOLN
INTRAMUSCULAR | Status: DC | PRN
  Administered 2019-09-09 (×2): 1 mg via INTRAVENOUS

## 2019-09-09 MED ORDER — SODIUM CHLORIDE 0.9% FLUSH
3.0000 mL | Freq: Two times a day (BID) | INTRAVENOUS | Status: DC
  Administered 2019-09-10 (×2): 3 mL via INTRAVENOUS

## 2019-09-09 MED ORDER — FENTANYL CITRATE (PF) 100 MCG/2ML IJ SOLN
INTRAMUSCULAR | Status: AC
  Filled 2019-09-09: qty 2

## 2019-09-09 MED ORDER — SODIUM CHLORIDE 0.9 % WEIGHT BASED INFUSION
1.0000 mL/kg/h | INTRAVENOUS | Status: AC
  Administered 2019-09-09 (×2): 1 mL/kg/h via INTRAVENOUS

## 2019-09-09 MED ORDER — HEPARIN (PORCINE) IN NACL 1000-0.9 UT/500ML-% IV SOLN
INTRAVENOUS | Status: AC
  Filled 2019-09-09: qty 1000

## 2019-09-09 MED ORDER — HEPARIN SODIUM (PORCINE) 1000 UNIT/ML IJ SOLN
INTRAMUSCULAR | Status: AC
  Filled 2019-09-09: qty 1

## 2019-09-09 MED ORDER — HEPARIN SODIUM (PORCINE) 1000 UNIT/ML IJ SOLN
INTRAMUSCULAR | Status: DC | PRN
  Administered 2019-09-09: 3000 [IU] via INTRAVENOUS
  Administered 2019-09-09: 5000 [IU] via INTRAVENOUS

## 2019-09-09 MED ORDER — SODIUM CHLORIDE 0.9 % IV SOLN: 250.0000 mL | INTRAVENOUS | Status: DC | PRN

## 2019-09-09 MED ORDER — SODIUM CHLORIDE 0.9% FLUSH: 3.0000 mL | INTRAVENOUS | Status: DC | PRN

## 2019-09-09 MED ORDER — TICAGRELOR 90 MG PO TABS
90.0000 mg | ORAL_TABLET | Freq: Two times a day (BID) | ORAL | Status: DC
  Administered 2019-09-10 (×2): 90 mg via ORAL
  Filled 2019-09-09 (×2): qty 1

## 2019-09-09 MED ORDER — NITROGLYCERIN 1 MG/10 ML FOR IR/CATH LAB
INTRA_ARTERIAL | Status: DC | PRN
  Administered 2019-09-09 (×2): 150 ug via INTRACORONARY

## 2019-09-09 MED ORDER — MIDAZOLAM HCL 2 MG/2ML IJ SOLN
INTRAMUSCULAR | Status: AC
  Filled 2019-09-09: qty 2

## 2019-09-09 MED ORDER — LIDOCAINE HCL (PF) 1 % IJ SOLN
INTRAMUSCULAR | Status: AC
  Filled 2019-09-09: qty 30

## 2019-09-09 MED ORDER — LABETALOL HCL 5 MG/ML IV SOLN: 10.0000 mg | INTRAVENOUS | Status: AC | PRN

## 2019-09-09 MED ORDER — LIDOCAINE HCL (PF) 1 % IJ SOLN
INTRAMUSCULAR | Status: DC | PRN
  Administered 2019-09-09: 2 mL

## 2019-09-09 MED ORDER — TICAGRELOR 90 MG PO TABS
ORAL_TABLET | ORAL | Status: DC | PRN
  Administered 2019-09-09: 180 mg via ORAL

## 2019-09-09 SURGICAL SUPPLY — 19 items
BAG SNAP BAND KOVER 36X36 (MISCELLANEOUS) ×1 IMPLANT
BALLN SAPPHIRE 2.5X12 (BALLOONS) ×2
BALLN SAPPHIRE ~~LOC~~ 3.0X8 (BALLOONS) ×1 IMPLANT
BALLOON SAPPHIRE 2.5X12 (BALLOONS) IMPLANT
CATH 5FR JL3.5 JR4 ANG PIG MP (CATHETERS) ×1 IMPLANT
CATH LAUNCHER 5F EBU3.0 (CATHETERS) IMPLANT
CATHETER LAUNCHER 5F EBU3.0 (CATHETERS) ×2
COVER DOME SNAP 22 D (MISCELLANEOUS) ×1 IMPLANT
DEVICE RAD COMP TR BAND LRG (VASCULAR PRODUCTS) ×1 IMPLANT
GLIDESHEATH SLEND SS 6F .021 (SHEATH) ×1 IMPLANT
GUIDEWIRE INQWIRE 1.5J.035X260 (WIRE) IMPLANT
INQWIRE 1.5J .035X260CM (WIRE) ×2
KIT ENCORE 26 ADVANTAGE (KITS) ×1 IMPLANT
KIT HEART LEFT (KITS) ×2 IMPLANT
PACK CARDIAC CATHETERIZATION (CUSTOM PROCEDURE TRAY) ×2 IMPLANT
STENT RESOLUTE ONYX 2.5X15 (Permanent Stent) ×1 IMPLANT
TRANSDUCER W/STOPCOCK (MISCELLANEOUS) ×2 IMPLANT
TUBING CIL FLEX 10 FLL-RA (TUBING) ×2 IMPLANT
WIRE COUGAR XT STRL 190CM (WIRE) ×2 IMPLANT

## 2019-09-09 NOTE — Interval H&P Note (Signed)
Cath Lab Visit (complete for each Cath Lab visit)  Clinical Evaluation Leading to the Procedure:   ACS: Yes.    Non-ACS:    Anginal Classification: CCS III  Anti-ischemic medical therapy: Minimal Therapy (1 class of medications)  Non-Invasive Test Results: No non-invasive testing performed  Prior CABG: No previous CABG      History and Physical Interval Note:  09/09/2019 3:12 PM  [name] Comment  has presented today for surgery, with the diagnosis of NSTEMI.  The various methods of treatment have been discussed with the patient and family. After consideration of risks, benefits and other options for treatment, the patient has consented to  Procedure(s): LEFT HEART CATH AND CORONARY ANGIOGRAPHY (N/A) as a surgical intervention.  The patient's history has been reviewed, patient examined, no change in status, stable for surgery.  I have reviewed the patient's chart and labs.  Questions were answered to the patient's satisfaction.     Eric Ball

## 2019-09-09 NOTE — H&P (View-Only) (Signed)
Progress Note  Patient Name: Eric Ball Date of Encounter: 09/09/2019  Primary Cardiologist: No primary care provider on file.  Dr. Stanford Breed  Subjective   No chest pain currently.  Hoping to get his heart catheterization over with a soon as possible.  Inpatient Medications    Scheduled Meds: . aspirin EC  81 mg Oral Daily  . carvedilol  12.5 mg Oral BID WC  . insulin aspart  0-9 Units Subcutaneous TID WC  . irbesartan  150 mg Oral Daily  . omega-3 acid ethyl esters  2 capsule Oral BID  . pantoprazole  40 mg Oral Daily  . sodium chloride flush  3 mL Intravenous Q12H  . tamsulosin  0.4 mg Oral Daily   Continuous Infusions: . sodium chloride    . sodium chloride 1 mL/kg/hr (09/09/19 0505)  . heparin 900 Units/hr (09/08/19 1653)  . nitroGLYCERIN 5 mcg/min (09/07/19 1723)   PRN Meds: sodium chloride, acetaminophen, nitroGLYCERIN, ondansetron (ZOFRAN) IV, sodium chloride flush   Vital Signs    Vitals:   09/08/19 2117 09/08/19 2323 09/09/19 0324 09/09/19 0732  BP: 122/82 114/77 117/73 127/86  Pulse: 79 78 67 72  Resp: 18   16  Temp: 97.6 F (36.4 C) 98.4 F (36.9 C) 97.7 F (36.5 C) 98 F (36.7 C)  TempSrc: Oral Oral Oral Oral  SpO2: 95% 96% 96% 97%  Weight:   74.2 kg   Height:        Intake/Output Summary (Last 24 hours) at 09/09/2019 0803 Last data filed at 09/08/2019 1900 Gross per 24 hour  Intake 1086 ml  Output --  Net 1086 ml   Last 3 Weights 09/09/2019 09/08/2019 09/07/2019  Weight (lbs) 163 lb 8 oz 165 lb 8 oz 168 lb  Weight (kg) 74.163 kg 75.07 kg 76.204 kg      Telemetry    Normal sinus rhythm- Personally Reviewed  ECG    Normal sinus rhythm, no ST changes- Personally Reviewed  Physical Exam   GEN: No acute distress.   Neck: No JVD Cardiac: RRR, no murmurs, rubs, or gallops.  Respiratory: Clear to auscultation bilaterally. GI: Soft, nontender, non-distended  MS: No edema; No deformity. Neuro:  Nonfocal  Psych: Normal affect   Labs     High Sensitivity Troponin:   Recent Labs  Lab 09/07/19 1433 09/07/19 1727  TROPONINIHS 39* 222*      Chemistry Recent Labs  Lab 09/07/19 1433 09/08/19 0453  NA 135 136  K 5.2* 4.3  CL 102 105  CO2 20* 20*  GLUCOSE 140* 145*  BUN 28* 19  CREATININE 1.46* 1.30*  CALCIUM 9.5 9.0  GFRNONAA 49* 56*  GFRAA 57* >60  ANIONGAP 13 11     Hematology Recent Labs  Lab 09/07/19 1433 09/08/19 0453 09/09/19 0404  WBC 9.1 6.8 6.3  RBC 4.62 4.24 4.24  HGB 13.8 12.6* 12.9*  HCT 41.6 38.3* 38.1*  MCV 90.0 90.3 89.9  MCH 29.9 29.7 30.4  MCHC 33.2 32.9 33.9  RDW 12.2 12.3 12.4  PLT 232 205 208    BNPNo results for input(s): BNP, PROBNP in the last 168 hours.   DDimer No results for input(s): DDIMER in the last 168 hours.   Radiology    DG Chest 2 View  Result Date: 09/07/2019 CLINICAL DATA:  Chest pain, swelling, tingling down the left arm. EXAM: CHEST - 2 VIEW COMPARISON:  Chest radiograph 06/30/2017 FINDINGS: The heart size and mediastinal contours are within normal limits. The lungs  are clear. No pneumothorax or pleural effusion. Multilevel degenerative disc disease in the thoracic spine. IMPRESSION: No active cardiopulmonary disease. Electronically Signed   By: Audie Pinto M.D.   On: 09/07/2019 14:55    Cardiac Studies   2018 cath reviewed, stent to RCA was placed.  Patient Profile     69 y.o. male known history of CAD, HLD on Praluent, DM and HTN.  Lateral wall infarct 2004 with DES to circumflex. Restenosis with PCI in 2015 December 2018 recurrent angina with stent to DES to RCA. Anginal equivalent is fatigue and dyspnea.   Assessment & Plan    1. NSTEMI: Plan for cardiac cath later today.  It appears it will be in the afternoon.  We will give clear liquid breakfast.  Creatinine stable. 2. Known CAD s/p DES to Cx (2004) with hx of re-stenosis (2018), DES to RCA (2018) - hs troponin: 39 --> 222 - EKG  - heparin gtt running - plan for left heart cath  today.  All questions about cath answered.   3. Hyperlipidemia - 09/08/2019: Cholesterol 151; HDL 43; LDL Cholesterol 69; Triglycerides 196; VLDL 39 - on praluent, intolerant to statins   4. DM - A1c 7.4%.  Will need healthy diet and regular exercise.   Further plans based on cardiac cath result.      For questions or updates, please contact [place] Please consult [email or web address] for contact info under         Signed, Jettie Booze, MD  09/09/2019, 8:03 AM

## 2019-09-09 NOTE — Progress Notes (Signed)
[place] for Heparin Indication: chest pain/ACS  Allergies  Allergen Reactions  . Statins Other (See Comments)    Joint pain   . Crestor [Rosuvastatin Calcium]     Myalgias    Patient Measurements: Height: 5\' 10"  (177.8 cm) Weight: 163 lb 8 oz (74.2 kg) IBW/kg (Calculated) : 73 Heparin Dosing Weight: 76.2 kg  Vital Signs: Temp: 98 F (36.7 C) (02/15 0732) Temp Source: Oral (02/15 0732) BP: 127/86 (02/15 0732) Pulse Rate: 72 (02/15 0732)  Labs: Recent Labs    09/07/19 1433 09/07/19 1433 09/07/19 1727 09/07/19 2245 09/08/19 0453 09/09/19 0404  HGB 13.8   < >  --   --  12.6* 12.9*  HCT 41.6  --   --   --  38.3* 38.1*  PLT 232  --   --   --  205 208  HEPARINUNFRC  --   --   --  0.49 0.39 0.37  CREATININE 1.46*  --   --   --  1.30*  --   TROPONINIHS 39*  --  222*  --   --   --    < > = values in this interval not displayed.    Estimated Creatinine Clearance: 56.9 mL/min (A) (by C-G formula based on SCr of 1.3 mg/dL (H)).   Medical History: Past Medical History:  Diagnosis Date  . Complication of anesthesia    "I'm slow to come out"  . Coronary artery disease 2015   PTCA with DES to Cx; PCI  BMS 2004  . GERD (gastroesophageal reflux disease)   . H/O hiatal hernia   . History of myocardial infarction    with a stent placed at that time  . Hypercholesterolemia    with intolerant ot many of the cholesterol medicaitons  . Hypertension    essentai hypertension  . Ischemic heart disease   . Myocardial infarction [facility]) 2004   with a stent placed at that time  . Nephrosis  1960's  . Sleep apnea    "suppose to wear mask; I don't wear it enough" (11/15/2013)  . Type II diabetes mellitus ([place])     Assessment: Patient is a 61 yom that presents to the ED with c/o chest pain. The patient has a HX of a previous MI requiring stents. Pharmacy has been asked to dose heparin for ACS. Plans noted for cath today -heparin level at  goal   Goal of Therapy:  Heparin level 0.3-0.7 units/ml Monitor platelets by anticoagulation protocol: Yes   Plan:  -Continue heparin at 900 units/hr -Will follow plans post cath  Hildred Laser, PharmD Clinical Pharmacist **Pharmacist phone directory can now be found on [email or web address] ([email or web address]).  Listed under [place].

## 2019-09-09 NOTE — Progress Notes (Addendum)
Progress Note  Patient Name: Eric Ball Date of Encounter: 09/09/2019  Primary Cardiologist: No primary care provider on file.  Dr. Stanford Breed  Subjective   No chest pain currently.  Hoping to get his heart catheterization over with a soon as possible.  Inpatient Medications    Scheduled Meds: . aspirin EC  81 mg Oral Daily  . carvedilol  12.5 mg Oral BID WC  . insulin aspart  0-9 Units Subcutaneous TID WC  . irbesartan  150 mg Oral Daily  . omega-3 acid ethyl esters  2 capsule Oral BID  . pantoprazole  40 mg Oral Daily  . sodium chloride flush  3 mL Intravenous Q12H  . tamsulosin  0.4 mg Oral Daily   Continuous Infusions: . sodium chloride    . sodium chloride 1 mL/kg/hr (09/09/19 0505)  . heparin 900 Units/hr (09/08/19 1653)  . nitroGLYCERIN 5 mcg/min (09/07/19 1723)   PRN Meds: sodium chloride, acetaminophen, nitroGLYCERIN, ondansetron (ZOFRAN) IV, sodium chloride flush   Vital Signs    Vitals:   09/08/19 2117 09/08/19 2323 09/09/19 0324 09/09/19 0732  BP: 122/82 114/77 117/73 127/86  Pulse: 79 78 67 72  Resp: 18   16  Temp: 97.6 F (36.4 C) 98.4 F (36.9 C) 97.7 F (36.5 C) 98 F (36.7 C)  TempSrc: Oral Oral Oral Oral  SpO2: 95% 96% 96% 97%  Weight:   74.2 kg   Height:        Intake/Output Summary (Last 24 hours) at 09/09/2019 0803 Last data filed at 09/08/2019 1900 Gross per 24 hour  Intake 1086 ml  Output --  Net 1086 ml   Last 3 Weights 09/09/2019 09/08/2019 09/07/2019  Weight (lbs) 163 lb 8 oz 165 lb 8 oz 168 lb  Weight (kg) 74.163 kg 75.07 kg 76.204 kg      Telemetry    Normal sinus rhythm- Personally Reviewed  ECG    Normal sinus rhythm, no ST changes- Personally Reviewed  Physical Exam   GEN: No acute distress.   Neck: No JVD Cardiac: RRR, no murmurs, rubs, or gallops.  Respiratory: Clear to auscultation bilaterally. GI: Soft, nontender, non-distended  MS: No edema; No deformity. Neuro:  Nonfocal  Psych: Normal affect   Labs     High Sensitivity Troponin:   Recent Labs  Lab 09/07/19 1433 09/07/19 1727  TROPONINIHS 39* 222*      Chemistry Recent Labs  Lab 09/07/19 1433 09/08/19 0453  NA 135 136  K 5.2* 4.3  CL 102 105  CO2 20* 20*  GLUCOSE 140* 145*  BUN 28* 19  CREATININE 1.46* 1.30*  CALCIUM 9.5 9.0  GFRNONAA 49* 56*  GFRAA 57* >60  ANIONGAP 13 11     Hematology Recent Labs  Lab 09/07/19 1433 09/08/19 0453 09/09/19 0404  WBC 9.1 6.8 6.3  RBC 4.62 4.24 4.24  HGB 13.8 12.6* 12.9*  HCT 41.6 38.3* 38.1*  MCV 90.0 90.3 89.9  MCH 29.9 29.7 30.4  MCHC 33.2 32.9 33.9  RDW 12.2 12.3 12.4  PLT 232 205 208    BNPNo results for input(s): BNP, PROBNP in the last 168 hours.   DDimer No results for input(s): DDIMER in the last 168 hours.   Radiology    DG Chest 2 View  Result Date: 09/07/2019 CLINICAL DATA:  Chest pain, swelling, tingling down the left arm. EXAM: CHEST - 2 VIEW COMPARISON:  Chest radiograph 06/30/2017 FINDINGS: The heart size and mediastinal contours are within normal limits. The lungs  are clear. No pneumothorax or pleural effusion. Multilevel degenerative disc disease in the thoracic spine. IMPRESSION: No active cardiopulmonary disease. Electronically Signed   By: Audie Pinto M.D.   On: 09/07/2019 14:55    Cardiac Studies   2018 cath reviewed, stent to RCA was placed.  Patient Profile     69 y.o. male known history of CAD, HLD on Praluent, DM and HTN.  Lateral wall infarct 2004 with DES to circumflex. Restenosis with PCI in 2015 December 2018 recurrent angina with stent to DES to RCA. Anginal equivalent is fatigue and dyspnea.   Assessment & Plan    1. NSTEMI: Plan for cardiac cath later today.  It appears it will be in the afternoon.  We will give clear liquid breakfast.  Creatinine stable. 2. Known CAD s/p DES to Cx (2004) with hx of re-stenosis (2018), DES to RCA (2018) - hs troponin: 39 --> 222 - EKG  - heparin gtt running - plan for left heart cath  today.  All questions about cath answered.   3. Hyperlipidemia - 09/08/2019: Cholesterol 151; HDL 43; LDL Cholesterol 69; Triglycerides 196; VLDL 39 - on praluent, intolerant to statins   4. DM - A1c 7.4%.  Will need healthy diet and regular exercise.   Further plans based on cardiac cath result.      For questions or updates, please contact [place] Please consult [email or web address] for contact info under         Signed, Jettie Booze, MD  09/09/2019, 8:03 AM

## 2019-09-10 ENCOUNTER — Telehealth: Payer: Self-pay

## 2019-09-10 DIAGNOSIS — E7800 Pure hypercholesterolemia, unspecified: Secondary | ICD-10-CM

## 2019-09-10 DIAGNOSIS — I10 Essential (primary) hypertension: Secondary | ICD-10-CM

## 2019-09-10 LAB — CBC
HCT: 39 % (ref 39.0–52.0)
Hemoglobin: 13 g/dL (ref 13.0–17.0)
MCH: 29.8 pg (ref 26.0–34.0)
MCHC: 33.3 g/dL (ref 30.0–36.0)
MCV: 89.4 fL (ref 80.0–100.0)
Platelets: 223 10*3/uL (ref 150–400)
RBC: 4.36 MIL/uL (ref 4.22–5.81)
RDW: 12.3 % (ref 11.5–15.5)
WBC: 5.9 10*3/uL (ref 4.0–10.5)
nRBC: 0 % (ref 0.0–0.2)

## 2019-09-10 LAB — GLUCOSE, CAPILLARY: Glucose-Capillary: 156 mg/dL — ABNORMAL HIGH (ref 70–99)

## 2019-09-10 LAB — BASIC METABOLIC PANEL
Anion gap: 12 (ref 5–15)
BUN: 13 mg/dL (ref 8–23)
CO2: 20 mmol/L — ABNORMAL LOW (ref 22–32)
Calcium: 8.8 mg/dL — ABNORMAL LOW (ref 8.9–10.3)
Chloride: 105 mmol/L (ref 98–111)
Creatinine, Ser: 1.14 mg/dL (ref 0.61–1.24)
GFR calc Af Amer: >60 mL/min (ref >60)
GFR calc non Af Amer: >60 mL/min (ref >60)
Glucose, Bld: 140 mg/dL — ABNORMAL HIGH (ref 70–99)
Potassium: 4.3 mmol/L (ref 3.5–5.1)
Sodium: 137 mmol/L (ref 135–145)

## 2019-09-10 LAB — HEPARIN LEVEL (UNFRACTIONATED): Heparin Unfractionated: <0.1 IU/mL — ABNORMAL LOW (ref 0.30–0.70)

## 2019-09-10 MED ORDER — METFORMIN HCL 1000 MG PO TABS: 1000.0000 mg | ORAL_TABLET | Freq: Two times a day (BID) | ORAL | 3 refills | Status: DC

## 2019-09-10 MED ORDER — TICAGRELOR 90 MG PO TABS: 90.0000 mg | ORAL_TABLET | Freq: Two times a day (BID) | ORAL | 3 refills | Status: DC

## 2019-09-10 MED ORDER — ASPIRIN 81 MG PO TBEC: 81.0000 mg | DELAYED_RELEASE_TABLET | Freq: Every day | ORAL | 3 refills | Status: DC

## 2019-09-10 MED FILL — BRILINTA 90 MG TABLET: 90 | 30 days supply | Qty: 60 | Fill #0

## 2019-09-10 NOTE — Progress Notes (Signed)
T BAND REMOVAL  LOCATION:    right radial  DEFLATED PER PROTOCOL:    Yes.    TIME BAND OFF / DRESSING APPLIED:    2230   SITE UPON ARRIVAL:    Level 0  SITE AFTER BAND REMOVAL:    Level 0  CIRCULATION SENSATION AND MOVEMENT:    Within Normal Limits   Yes.    COMMENTS:   Pt.tolerated well.no bleeding or hematoma noted;

## 2019-09-10 NOTE — Progress Notes (Signed)
CARDIAC REHAB PHASE I   PRE:  Rate/Rhythm: 75 SR  BP:  Sitting: 131/78      SaO2: 95 RA  MODE:  Ambulation: 400 ft   POST:  Rate/Rhythm: 100 ST  BP:  Sitting: 142/88    SaO2: 96 RA  Pt ambulated 430ft in hallway independently with slow steady gait. Pt denies CP or SOB. Pt educated on importance of ASA, Brilinta, and NTG. Pt states he had been on Brilinta in the past and could not afford it. PA made aware, CM consult placed. Pt given stent card, along with heart healthy and diabetic diets. Reviewed site care, restrictions and exercise guidelines. Will refer to CRP II [place]. Pt states he could not afford to go in the past, and unsure if he will participate. Pt is interested in participating in Virtual Cardiac and Pulmonary Rehab. Pt advised that Virtual Cardiac and Pulmonary Rehab is provided at no cost to the patient.  Checklist:  1. Pt has smart device  ie smartphone and/or ipad for downloading an app  Yes 2. Reliable internet/wifi service    Yes 3. Understands how to use their smartphone and navigate within an app.  Yes  Pt verbalized understanding and is in agreement.   [id number] Rufina Falco, RN BSN 09/10/2019 8:49 AM

## 2019-09-10 NOTE — Discharge Summary (Addendum)
Discharge Summary    Patient ID: Eric Ball MRN: [id number]; DOB: Mar 19, 1951  Admit date: 09/07/2019 Discharge date: 09/10/2019  Primary Care Provider: Redmond School, MD  Primary Cardiologist: [name], MD  Primary Electrophysiologist:  None   Discharge Diagnoses    Principal Problem:   NSTEMI (non-ST elevated myocardial infarction) [facility]) Active Problems:   Coronary artery disease   DM II (diabetes mellitus, type II), controlled ([place])   Hypercholesterolemia   Ischemic heart disease   Hypertension   Diagnostic Studies/Procedures    Left heart cath 09/09/19: 1. Severe de novo proximal LAD stenosis, treated successfully with a 2.5x15 mm Resolute Onyx DES, post-dilated with a 3.0 [place] balloon 2. Continued patency of the LCx and RCA stents 3. Low normal LV systolic function, LVEF estimated at 55%  Recommend: continue ASA and ticagrelor at least 12 months without interruption _____________   History of Present Illness     Eric Ball is a 69 y.o. male with known history of CAD, HLD on Praluent, DM and HTN. Lateral wall infarct 2004 with DES to circumflex. Restenosis with PCI in 2015 December 2018 recurrent angina with stent to DES to RCA. Anginal equivalent is fatigue and dyspnea.  Hospital Course     Consultants: none  NSTEMI CAD s/p DES to Cx (2004) with re-stenosis (2018), DES to RCA (2018) He was admitted to cardiology. HS troponin 39 --> 222. Given his known disease, decision was made to obtain definitive angiography. Heart cath 09/09/19 revealed 75% stenosis in the proxima-mid LAD successfully treated with DES. He was placed on ASA and brilinta. He tolerated the procedure well. Cath site C/D/I. Continue BB.    Hyperlipidemia 09/08/2019: Cholesterol 151; HDL 43; LDL Cholesterol 69; Triglycerides 196; VLDL 39 On praluent, intolerant to statins.   Hypertension Continue BB and home ARB. Pressures are well-controlled.   DM A1c was 7.4%. Will need  healthy diet and exercise. Defer to PCP.   Pt seen and examined by Dr. Irish Lack and felt stable for discharge.   NOTE: There was initial question as to whether or not the patient would receive 30 days of free brilinta since he used this benefit in 2018. TOC pharmacy WAS able to fill the first 30 days for free. [facility] called [facility] and asked them to keep [facility] brilinta script on file. If the coupon card does not work for subsequent refills - per Dr. Irish Lack, Farley to switch to plavix with a 300 mg load x 1.     Did the patient have an acute coronary syndrome (MI, NSTEMI, STEMI, etc) this admission?:  Yes                               AHA/ACC Clinical Performance & Quality Measures: 1. Aspirin prescribed? - Yes 2. ADP Receptor Inhibitor (Plavix/Clopidogrel, Brilinta/Ticagrelor or Effient/Prasugrel) prescribed (includes medically managed patients)? - Yes 3. Beta Blocker prescribed? - Yes 4. High Intensity Statin (Lipitor 40-80mg  or Crestor 20-40mg ) prescribed? - No - intolerant to statins, on praluent 5. EF assessed during THIS hospitalization? - Yes 6. For EF <40%, was ACEI/ARB prescribed? - Yes 7. For EF <40%, Aldosterone Antagonist (Spironolactone or Eplerenone) prescribed? - Not Applicable (EF >/= [id number]) 8. Cardiac Rehab Phase II ordered (Included Medically managed Patients)? - Yes   _____________  Discharge Vitals Blood pressure 131/78, pulse 80, temperature 98.1 F (36.7 C), temperature source Oral, resp. rate 15, height 5\' 10"  (1.778 m), weight 76.7 kg,  SpO2 97 %.  Filed Weights   09/08/19 0347 09/09/19 0324 09/10/19 0455  Weight: 75.1 kg 74.2 kg 76.7 kg    Labs & Radiologic Studies    CBC Recent Labs    09/09/19 0404 09/10/19 0430  WBC 6.3 5.9  HGB 12.9* 13.0  HCT 38.1* 39.0  MCV 89.9 89.4  PLT 208 [id number]   Basic Metabolic Panel Recent Labs    09/09/19 1023 09/10/19 0430  NA 133* 137  K 5.0 4.3  CL 102 105  CO2 19* 20*  GLUCOSE 170* 140*  BUN 15 13  CREATININE 1.30*  1.14  CALCIUM 9.0 8.8*   Liver Function Tests No results for input(s): AST, ALT, ALKPHOS, BILITOT, PROT, ALBUMIN in the last 72 hours. No results for input(s): LIPASE, AMYLASE in the last 72 hours. High Sensitivity Troponin:   Recent Labs  Lab 09/07/19 1433 09/07/19 1727  TROPONINIHS 39* 222*    BNP Invalid input(s): POCBNP D-Dimer No results for input(s): DDIMER in the last 72 hours. Hemoglobin A1C Recent Labs    09/08/19 0453  HGBA1C 7.4*   Fasting Lipid Panel Recent Labs    09/08/19 0453  CHOL 151  HDL 43  LDLCALC 69  TRIG 196*  CHOLHDL 3.5   Thyroid Function Tests No results for input(s): TSH, T4TOTAL, T3FREE, THYROIDAB in the last 72 hours.  Invalid input(s): FREET3 _____________  DG Chest 2 View  Result Date: 09/07/2019 CLINICAL DATA:  Chest pain, swelling, tingling down the left arm. EXAM: CHEST - 2 VIEW COMPARISON:  Chest radiograph 06/30/2017 FINDINGS: The heart size and mediastinal contours are within normal limits. The lungs are clear. No pneumothorax or pleural effusion. Multilevel degenerative disc disease in the thoracic spine. IMPRESSION: No active cardiopulmonary disease. Electronically Signed   By: Audie Pinto M.D.   On: 09/07/2019 14:55   CARDIAC CATHETERIZATION  Result Date: 09/09/2019 1. Severe de novo proximal LAD stenosis, treated successfully with a 2.5x15 mm Resolute Onyx DES, post-dilated with a 3.0 [place] balloon 2. Continued patency of the LCx and RCA stents 3. Low normal LV systolic function, LVEF estimated at 55% Recommend: continue ASA and ticagrelor at least 12 months without interruption  Disposition   Pt is being discharged home today in good condition.  Follow-up Plans & Appointments    Follow-up Information    Deberah Pelton, NP Follow up on 09/19/2019.   Specialty: Cardiology Why: 12:00 for [facility] Contact information: [address] [place] [address] [phone]          Discharge Instructions     Amb Referral to Cardiac Rehabilitation   Complete by: As directed    Diagnosis: Coronary Stents   After initial evaluation and assessments completed: Virtual Based Care may be provided alone or in conjunction with Phase 2 Cardiac Rehab based on patient barriers.: Yes   Diet - low sodium heart healthy   Complete by: As directed    Discharge instructions   Complete by: As directed    No driving for 2 days. No lifting over 5 lbs for 1 week. No sexual activity for 1 week. You may return to work in 1 week. Keep procedure site clean & dry. If you notice increased pain, swelling, bleeding or pus, call/return!  You may shower, but no soaking baths/hot tubs/pools for 1 week.   Increase activity slowly   Complete by: As directed       Discharge Medications   Allergies as of 09/10/2019  Reactions   Statins Other (See Comments)   Joint pain    Crestor [rosuvastatin Calcium]    Myalgias      Medication List    STOP taking these medications   aspirin 81 MG tablet Replaced by: aspirin 81 MG EC tablet     TAKE these medications   aspirin 81 MG EC tablet Take 1 tablet (81 mg total) by mouth daily. Start taking on: September 11, 2019 Replaces: aspirin 81 MG tablet   carvedilol 12.5 MG tablet Commonly known as: COREG Take 1 tablet (12.5 mg total) by mouth 2 (two) times daily with a meal. Please keep upcoming appointment   glimepiride 1 MG tablet Commonly known as: AMARYL Take 2 tablets by mouth daily as needed (blood glucose levels).   metFORMIN 1000 MG tablet Commonly known as: Glucophage Take 1 tablet (1,000 mg total) by mouth 2 (two) times daily with a meal. Resume metformin on 09/12/19. What changed: additional instructions   nitroGLYCERIN 0.4 MG SL tablet Commonly known as: NITROSTAT Place 1 tablet (0.4 mg total) under the tongue every 5 (five) minutes as needed for chest pain.   omega-3 acid ethyl esters 1 g capsule Commonly known as: LOVAZA TAKE 2 CAPSULES BY MOUTH TWICE  A DAY   pantoprazole 40 MG tablet Commonly known as: Protonix Take 1 tablet (40 mg total) by mouth daily.   Praluent 150 MG/ML [place] Generic drug: Alirocumab Inject 1 pen into the skin every 14 (fourteen) days.   tamsulosin 0.4 MG Caps capsule Commonly known as: FLOMAX Take 0.4 mg by mouth daily.   ticagrelor 90 MG Tabs tablet Commonly known as: BRILINTA Take 1 tablet (90 mg total) by mouth 2 (two) times daily.   valsartan 160 MG tablet Commonly known as: DIOVAN Take 1 tablet (160 mg total) by mouth daily.          Outstanding Labs/Studies   none  Duration of Discharge Encounter   Greater than 30 minutes including physician time.  Signed, Tami Lin Duke, PA 09/10/2019, 10:51 AM  I have examined the patient and reviewed assessment and plan and discussed with patient.  Agree with above as stated.    GEN: Well nourished, well developed, in no acute distress  HEENT: normal  Neck: no JVD, carotid bruits, or masses Cardiac: RRR; no murmurs, rubs, or gallops,no edema  Respiratory:  clear to auscultation bilaterally, normal work of breathing GI: soft, nontender, nondistended,  MS: no deformity or atrophy ; no hematoma at the right wrist.  2+ right radial pulse Skin: warm and dry, no rash Neuro:  Strength and sensation are intact Psych: euthymic mood, full affect  Continue aggressive secondary prevention.  He will be discharged on aspirin and Brilinta.  He has a card for 30 days of Brilinta.  If this medicine is very expensive, could switch to clopidogrel.  Would give 300 mg loading dose on the first day followed by 75 mg daily after that.  Okay for discharge today.   Larae Grooms

## 2019-09-10 NOTE — TOC Benefit Eligibility Note (Signed)
Transition of Care [facility]) Benefit Eligibility Note    Patient Details  Name: Eric Ball MRN: [id number] Date of Birth: [birth year]   Medication/Dose: BRILINTA   90 MG BID  Covered?: Yes  Tier: (TIER- 4 DRUG)  Prescription Coverage [facility]  Spoke with Person/Company/Phone Number:: JENNIFER  @ [employer] # [phone]  Co-Pay: $100.00     Deductible: Unmet(OUT-OF-POCKET- NOT MET)  Additional Notes: TICAGRELOR: NON- Jonah Blue Phone Number: 09/10/2019, 11:55 AM

## 2019-09-10 NOTE — Telephone Encounter (Signed)
TOC scheduled for 2/25 at 12 pm with Coletta Memos, NP

## 2019-09-10 NOTE — Care Management (Signed)
1006 09-10-19 Patient in need of Brilinta to transition home. Benefits Check submitted fr cost. Patient states he has used the 30 day free card previously. Case Manager awaiting cost and will make patient aware. If the patient unable to afford PA has stated that the patient will be changed to Plavix. Case Manager will continue to follow. Bethena Roys, RN,BNS Case Manager

## 2019-09-11 NOTE — Telephone Encounter (Signed)
Patient contacted regarding discharge from [facility] on 09/10/19.  Patient understands to follow up with provider Coletta Memos, NP on 09/19/19 at 12 pm at [facility]. Patient understands discharge instructions? Yes Patient understands medications and regiment? Yes Patient understands to bring all medications to this visit? Yes  Patient has been advised to call if he needs anything until his appointment.

## 2019-09-12 ENCOUNTER — Encounter (HOSPITAL_COMMUNITY): Payer: Self-pay | Admitting: *Deleted

## 2019-09-12 ENCOUNTER — Ambulatory Visit: Payer: Medicare HMO | Admitting: Cardiology

## 2019-09-12 NOTE — Progress Notes (Signed)
Referral received and verified for MD signature.  Follow up appt is 2/25. Insurance benefits and eligibility to be determined.Covid Risk Score 5.  Pt will be contacted for scheduling upon the satisfactory completion of his follow up appt on 2/25. Cherre Huger, BSN Cardiac and Training and development officer ;
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
## 2019-09-19 ENCOUNTER — Other Ambulatory Visit: Payer: Self-pay

## 2019-09-19 ENCOUNTER — Encounter: Payer: Self-pay | Admitting: General Practice

## 2019-09-19 ENCOUNTER — Ambulatory Visit (INDEPENDENT_AMBULATORY_CARE_PROVIDER_SITE_OTHER): Payer: Medicare HMO | Admitting: General Practice

## 2019-09-19 ENCOUNTER — Telehealth (HOSPITAL_COMMUNITY): Payer: Self-pay

## 2019-09-19 VITALS — BP 110/78 | HR 73 | Temp 97.4°F | Ht 70.0 in | Wt 167.8 lb

## 2019-09-19 DIAGNOSIS — I2510 Atherosclerotic heart disease of native coronary artery without angina pectoris: Secondary | ICD-10-CM | POA: Diagnosis not present

## 2019-09-19 DIAGNOSIS — E0800 Diabetes mellitus due to underlying condition with hyperosmolarity without nonketotic hyperglycemic-hyperosmolar coma (NKHHC): Secondary | ICD-10-CM

## 2019-09-19 DIAGNOSIS — I10 Essential (primary) hypertension: Secondary | ICD-10-CM

## 2019-09-19 DIAGNOSIS — I214 Non-ST elevation (NSTEMI) myocardial infarction: Secondary | ICD-10-CM | POA: Diagnosis not present

## 2019-09-19 DIAGNOSIS — E7800 Pure hypercholesterolemia, unspecified: Secondary | ICD-10-CM | POA: Diagnosis not present

## 2019-09-19 MED ORDER — NITROGLYCERIN 0.4 MG SL SUBL: 0.4000 mg | SUBLINGUAL_TABLET | SUBLINGUAL | 3 refills | Status: DC | PRN

## 2019-09-19 NOTE — Telephone Encounter (Signed)
Pt insurance is active and benefits verified through [facility] Co-pay $45, DED 0/0 met, out of pocket $5,000/$1,220 met, co-insurance 0%. no pre-authorization required. Passport, 09/19/2019@9 :49am, REF# [id number]  Will contact patient to see if he is interested in the Cardiac Rehab Program. If interested, patient will need to complete follow up appt. Once completed, patient will be contacted for scheduling upon review by the RN Navigator.

## 2019-09-19 NOTE — Patient Instructions (Addendum)
Medication Instructions:  The current medical regimen is effective;  continue present plan and medications as directed. Please refer to the Current Medication list given to you today. If you need a refill on your cardiac medications before your next appointment, please call your pharmacy.  Special Instructions: Please read and follow heart healthy diet-attached  PLEASE READ AND FOLLOW SALTY 6 ATTACHED  PLEASE INCREASE PHYSICAL ACTIVITY AS TOLERATED  Follow-Up: KEEP SCHEDULED APPOINTMENT 10-03-2019  In Person Eric Ruths, MD.   At [facility], you and your health needs are our priority.  As part of our continuing mission to provide you with exceptional heart care, we have created designated Provider Care Teams.  These Care Teams include your primary Cardiologist (physician) and Advanced Practice Providers (APPs -  Physician Assistants and Nurse Practitioners) who all work together to provide you with the care you need, when you need it.  Reduce your risk of getting COVID-19 With your heart disease it is especially important for people at increased risk of severe illness from COVID-19, and those who live with them, to protect themselves from getting COVID-19. The best way to protect yourself and to help reduce the spread of the virus that causes COVID-19 is to: Marland Kitchen Limit your interactions with other people as much as possible. . Take COVID-19 when you do interact with others. If you start feeling sick and think you may have COVID-19, get in touch with your healthcare provider within 24 hours.  Thank you for choosing [facility]!!        Fat and Cholesterol Restricted Eating Plan Getting too much fat and cholesterol in your diet may cause health problems. Choosing the right foods helps keep your fat and cholesterol at normal levels. This can keep you from getting certain diseases. What are tips for following this plan? Meal planning  At meals, divide your plate into  four equal parts: ? Fill one-half of your plate with vegetables and green salads. ? Fill one-fourth of your plate with whole grains. ? Fill one-fourth of your plate with low-fat (lean) protein foods.  Eat fish that is high in omega-3 fats at least two times a week. This includes mackerel, tuna, sardines, and salmon.  Eat foods that are high in fiber, such as whole grains, beans, apples, broccoli, carrots, peas, and barley. General tips   Work with your doctor to lose weight if you need to.  Avoid: ? Foods with added sugar. ? Fried foods. ? Foods with partially hydrogenated oils.  Limit alcohol intake to no more than 1 drink a day for nonpregnant women and 2 drinks a day for men. One drink equals 12 oz of beer, 5 oz of wine, or 1 oz of hard liquor. Reading food labels  Check food labels for: ? Trans fats. ? Partially hydrogenated oils. ? Saturated fat (g) in each serving. ? Cholesterol (mg) in each serving. ? Fiber (g) in each serving.  Choose foods with healthy fats, such as: ? Monounsaturated fats. ? Polyunsaturated fats. ? Omega-3 fats.  Choose grain products that have whole grains. Look for the word "whole" as the first word in the ingredient list. Cooking  Cook foods using low-fat methods. These include baking, boiling, grilling, and broiling.  Eat more home-cooked foods. Eat at restaurants and buffets less often.  Avoid cooking using saturated fats, such as butter, cream, palm oil, palm kernel oil, and coconut oil. Recommended foods  Fruits  All fresh, canned (in natural juice), or frozen fruits. Vegetables  Fresh or frozen vegetables (raw, steamed, roasted, or grilled). Green salads. Grains  Whole grains, such as whole wheat or whole grain breads, crackers, cereals, and pasta. Unsweetened oatmeal, bulgur, barley, quinoa, or brown rice. Corn or whole wheat flour tortillas. Meats and other protein foods  Ground beef (85% or leaner), grass-fed beef, or beef  trimmed of fat. Skinless chicken or [place]. Ground chicken or [place]. Pork trimmed of fat. All fish and seafood. Egg whites. Dried beans, peas, or lentils. Unsalted nuts or seeds. Unsalted canned beans. Nut butters without added sugar or oil. Dairy  Low-fat or nonfat dairy products, such as skim or 1% milk, 2% or reduced-fat cheeses, low-fat and fat-free ricotta or cottage cheese, or plain low-fat and nonfat yogurt. Fats and oils  Tub margarine without trans fats. Light or reduced-fat mayonnaise and salad dressings. Avocado. Olive, canola, sesame, or safflower oils. The items listed above may not be a complete list of foods and beverages you can eat. Contact a dietitian for more information. Foods to avoid Fruits  Canned fruit in heavy syrup. Fruit in cream or butter sauce. Fried fruit. Vegetables  Vegetables cooked in cheese, cream, or butter sauce. Fried vegetables. Grains  White bread. White pasta. White rice. Cornbread. Bagels, pastries, and croissants. Crackers and snack foods that contain trans fat and hydrogenated oils. Meats and other protein foods  Fatty cuts of meat. Ribs, chicken wings, bacon, sausage, bologna, salami, chitterlings, fatback, hot dogs, bratwurst, and packaged lunch meats. Liver and organ meats. Whole eggs and egg yolks. Chicken and [place] with skin. Fried meat. Dairy  Whole or 2% milk, cream, half-and-half, and cream cheese. Whole milk cheeses. Whole-fat or sweetened yogurt. Full-fat cheeses. Nondairy creamers and whipped toppings. Processed cheese, cheese spreads, and cheese curds. Beverages  Alcohol. Sugar-sweetened drinks such as sodas, lemonade, and fruit drinks. Fats and oils  Butter, stick margarine, lard, shortening, ghee, or bacon fat. Coconut, palm kernel, and palm oils. Sweets and desserts  Corn syrup, sugars, honey, and molasses. Candy. Jam and jelly. Syrup. Sweetened cereals. Cookies, pies, cakes, donuts, muffins, and ice cream. The items listed  above may not be a complete list of foods and beverages you should avoid. Contact a dietitian for more information. Summary  Choosing the right foods helps keep your fat and cholesterol at normal levels. This can keep you from getting certain diseases.  At meals, fill one-half of your plate with vegetables and green salads.  Eat high-fiber foods, like whole grains, beans, apples, carrots, peas, and barley.  Limit added sugar, saturated fats, alcohol, and fried foods. This information is not intended to replace advice given to you by your health care provider. Make sure you discuss any questions you have with your health care provider.

## 2019-09-23 DIAGNOSIS — I2510 Atherosclerotic heart disease of native coronary artery without angina pectoris: Secondary | ICD-10-CM | POA: Diagnosis not present

## 2019-09-23 DIAGNOSIS — E1165 Type 2 diabetes mellitus with hyperglycemia: Secondary | ICD-10-CM | POA: Diagnosis not present

## 2019-09-23 DIAGNOSIS — I10 Essential (primary) hypertension: Secondary | ICD-10-CM | POA: Diagnosis not present

## 2019-09-23 DIAGNOSIS — R079 Chest pain, unspecified: Secondary | ICD-10-CM | POA: Diagnosis not present

## 2019-09-23 DIAGNOSIS — Z6824 Body mass index (BMI) 24.0-24.9, adult: Secondary | ICD-10-CM | POA: Diagnosis not present

## 2019-09-24 NOTE — Progress Notes (Signed)
HPI: FU CAD; he has had PCI of his circumflex and right coronary artery. Echocardiogram December 2018 showed normal LV systolic function. Patient admitted with non-ST elevation myocardial infarction February 2021.  He was found to have severe proximal LAD stenosis and was treated with drug-eluting stent.  Previous stents in the circumflex and right coronary artery were patent.  Ejection fraction 55%.  Since last seen,he has mild dyspnea on exertion but no orthopnea, PND, pedal edema, chest pain or syncope.  Current Outpatient Medications  Medication Sig Dispense Refill  . Alirocumab (PRALUENT) 150 MG/ML SOAJ Inject 1 pen into the skin every 14 (fourteen) days. 6 pen 3  . aspirin EC 81 MG EC tablet Take 1 tablet (81 mg total) by mouth daily. 90 tablet 3  . carvedilol (COREG) 12.5 MG tablet Take 1 tablet (12.5 mg total) by mouth 2 (two) times daily with a meal. Please keep upcoming appointment 180 tablet 1  . glimepiride (AMARYL) 1 MG tablet Take 2 tablets by mouth daily as needed (blood glucose levels).     . metFORMIN (GLUCOPHAGE) 1000 MG tablet Take 1 tablet (1,000 mg total) by mouth 2 (two) times daily with a meal. Resume metformin on 09/12/19. 180 tablet 3  . nitroGLYCERIN (NITROSTAT) 0.4 MG SL tablet Place 1 tablet (0.4 mg total) under the tongue every 5 (five) minutes as needed for chest pain. 25 tablet 3  . omega-3 acid ethyl esters (LOVAZA) 1 g capsule TAKE 2 CAPSULES BY MOUTH TWICE A DAY (Patient taking differently: Take 2 g by mouth 2 (two) times daily. ) 360 capsule 0  . pantoprazole (PROTONIX) 40 MG tablet Take 1 tablet (40 mg total) by mouth daily. 90 tablet 3  . tamsulosin (FLOMAX) 0.4 MG CAPS capsule Take 0.4 mg by mouth daily.    . ticagrelor (BRILINTA) 90 MG TABS tablet Take 1 tablet (90 mg total) by mouth 2 (two) times daily. 180 tablet 3  . valsartan (DIOVAN) 160 MG tablet TAKE 1 TABLET BY MOUTH EVERY DAY 90 tablet 3   No current facility-administered medications for this  visit.     Past Medical History:  Diagnosis Date  . Complication of anesthesia    "I'm slow to come out"  . Coronary artery disease 2015   PTCA with DES to Cx; PCI  BMS 2004; LHC 08/2019 DES to LAD  . GERD (gastroesophageal reflux disease)   . H/O hiatal hernia   . History of myocardial infarction    with a stent placed at that time  . Hypercholesterolemia    with intolerant ot many of the cholesterol medicaitons  . Hypertension    essentai hypertension  . Ischemic heart disease   . Myocardial infarction [facility]) 2004   with a stent placed at that time  . Nephrosis  1960's  . Sleep apnea    "suppose to wear mask; I don't wear it enough" (11/15/2013)  . Type II diabetes mellitus ([place])     Past Surgical History:  Procedure Laterality Date  . BACK SURGERY    . COLONOSCOPY  10/07/2011   [name]:multiple colonic polyps removed/colonic diverticulosis, tubular adenoma  . COLONOSCOPY N/A 12/08/2014   Procedure: COLONOSCOPY;  Surgeon: [name], MD;  [facility];  Service: Endoscopy;  Laterality: N/A;  930  . CORONARY ANGIOPLASTY WITH STENT PLACEMENT  09/25/2002   EF-approximately 45% / stent placement to the mid arteriovenous circumflex with reduction of 99% narrowing to 0% with placement of a  3.0 x 16 mm Express II stent with improvement of TIMI grade 2 to TIMI grade 3 flow.  . CORONARY ANGIOPLASTY WITH STENT PLACEMENT  11/15/2013   "1"  . CORONARY STENT INTERVENTION N/A 07/03/2017   Procedure: CORONARY STENT INTERVENTION;  Surgeon: Nelva Bush, MD;  Location: [place] [facility];  Service: Cardiovascular;  Laterality: N/A;  . CORONARY STENT INTERVENTION N/A 09/09/2019   Procedure: CORONARY STENT INTERVENTION;  Surgeon: Sherren Mocha, MD;  Location: [place] [facility];  Service: Cardiovascular;  Laterality: N/A;  . LEFT HEART CATH AND CORONARY ANGIOGRAPHY N/A 09/09/2019   Procedure: LEFT HEART CATH AND CORONARY ANGIOGRAPHY;  Surgeon: Sherren Mocha, MD;  Location:  [place] [facility];  Service: Cardiovascular;  Laterality: N/A;  . LEFT HEART CATHETERIZATION WITH CORONARY ANGIOGRAM N/A 11/15/2013   Procedure: LEFT HEART CATHETERIZATION WITH CORONARY ANGIOGRAM;  Surgeon: Burnell Blanks, MD;  Location: [facility] CATH LAB;  Service: Cardiovascular;  Laterality: N/A;  . LUMBAR [place]  . NASAL SEPTUM SURGERY  02/20/2001   Septal reconstruction and turbinate reduction  . RIGHT/LEFT HEART CATH AND CORONARY ANGIOGRAPHY N/A 07/03/2017   Procedure: RIGHT/LEFT HEART CATH AND CORONARY ANGIOGRAPHY;  Surgeon: Nelva Bush, MD;  Location: [place] [facility] LAB;  Service: Cardiovascular;  Laterality: N/A;    Social History   Socioeconomic History  . Marital status: Married    Spouse name: Not on file  . Number of children: Not on file  . Years of education: Not on file  . Highest education level: Not on file  Occupational History  . Not on file  Tobacco Use  . Smoking status: Never Smoker  . Smokeless tobacco: Never Used  . Tobacco comment: Never smoker  Substance and Sexual Activity  . Alcohol use: No    Alcohol/week: 0.0 standard drinks  . Drug use: No  . Sexual activity: Yes  Other Topics Concern  . Not on file  Social History Narrative  . Not on file   Social Determinants of Health   Financial Resource Strain:   . Difficulty of Paying Living Expenses:   Food Insecurity:   . Worried About Charity fundraiser in the Last Year:   . Arboriculturist in the Last Year:   Transportation Needs:   . Film/video editor (Medical):   Marland Kitchen Lack of Transportation (Non-Medical):   Physical Activity:   . Days of Exercise per Week:   . Minutes of Exercise per Session:   Stress:   . Feeling of Stress :   Social Connections:   . Frequency of Communication with Friends and Family:   . Frequency of Social Gatherings with Friends and Family:   . Attends Religious Services:   . Active Member of Clubs or Organizations:   . Attends Theatre manager Meetings:   Marland Kitchen Marital Status:   Intimate Partner Violence:   . Fear of Current or Ex-Partner:   . Emotionally Abused:   Marland Kitchen Physically Abused:   . Sexually Abused:     Family History  Problem Relation Age of Onset  . Coronary artery disease Father        with multiple angioplasties  . Healthy Sister   . Hypertension Other        runs in the family  . Colon cancer Neg Hx     ROS: no fevers or chills, productive cough, hemoptysis, dysphasia, odynophagia, melena, hematochezia, dysuria, hematuria, rash, seizure activity, orthopnea, PND, pedal edema, claudication. Remaining systems are  negative.  Physical Exam: Well-developed well-nourished in no acute distress.  Skin is warm and dry.  HEENT is normal.  Neck is supple.  Chest is clear to auscultation with normal expansion.  Cardiovascular exam is regular rate and rhythm.  Abdominal exam nontender or distended. No masses palpated. Extremities show no edema. neuro grossly intact  A/P  1 coronary artery disease-patient doing well following recent intervention.  Continue aspirin, Brilinta.  He is intolerant to statins.  2 hypertension-blood pressure controlled.  Continue present medical regimen.  3 hyperlipidemia-intolerant to statins.  Continue praluent.  [name], MD

## 2019-09-25 ENCOUNTER — Other Ambulatory Visit: Payer: Self-pay | Admitting: Cardiology

## 2019-09-25 DIAGNOSIS — I10 Essential (primary) hypertension: Secondary | ICD-10-CM

## 2019-09-25 NOTE — Telephone Encounter (Signed)
Pt is interested in the virtual cardiac rehab only and will come in for orientation/walk test on 10/08/2019@1 :15pm. Pt will get set up on the virtual cardiac rehab app then.

## 2019-10-03 ENCOUNTER — Ambulatory Visit: Payer: Medicare HMO | Admitting: Cardiology

## 2019-10-03 ENCOUNTER — Other Ambulatory Visit: Payer: Self-pay

## 2019-10-03 ENCOUNTER — Encounter: Payer: Self-pay | Admitting: Cardiology

## 2019-10-03 VITALS — BP 130/84 | HR 69 | Temp 97.1°F | Ht 70.0 in | Wt 172.0 lb

## 2019-10-03 DIAGNOSIS — I2510 Atherosclerotic heart disease of native coronary artery without angina pectoris: Secondary | ICD-10-CM | POA: Diagnosis not present

## 2019-10-03 DIAGNOSIS — E7800 Pure hypercholesterolemia, unspecified: Secondary | ICD-10-CM

## 2019-10-03 DIAGNOSIS — I10 Essential (primary) hypertension: Secondary | ICD-10-CM | POA: Diagnosis not present

## 2019-10-03 DIAGNOSIS — T466X5A Adverse effect of antihyperlipidemic and antiarteriosclerotic drugs, initial encounter: Secondary | ICD-10-CM

## 2019-10-03 DIAGNOSIS — G720 Drug-induced myopathy: Secondary | ICD-10-CM

## 2019-10-03 NOTE — Patient Instructions (Signed)
Medication Instructions:  NO CHANGE *If you need a refill on your cardiac medications before your next appointment, please call your pharmacy*   Lab Work: If you have labs (blood work) drawn today and your tests are completely normal, you will receive your results only by: . MyChart Message (if you have MyChart) OR . A paper copy in the mail If you have any lab test that is abnormal or we need to change your treatment, we will call you to review the results.   Follow-Up: At [facility], you and your health needs are our priority.  As part of our continuing mission to provide you with exceptional heart care, we have created designated Provider Care Teams.  These Care Teams include your primary Cardiologist (physician) and Advanced Practice Providers (APPs -  Physician Assistants and Nurse Practitioners) who all work together to provide you with the care you need, when you need it.  We recommend signing up for the patient portal called "MyChart".  Sign up information is provided on this After Visit Summary.  MyChart is used to connect with patients for Virtual Visits (Telemedicine).  Patients are able to view lab/test results, encounter notes, upcoming appointments, etc.  Non-urgent messages can be sent to your provider as well.   To learn more about what you can do with MyChart, go to [email or web address].    Your next appointment:   6 month(s)  The format for your next appointment:   Either In Person or Virtual  Provider:   Brian Crenshaw, MD    

## 2019-10-04 ENCOUNTER — Encounter (HOSPITAL_COMMUNITY)
Admission: RE | Admit: 2019-10-04 | Discharge: 2019-10-04 | Disposition: A | Discharge status: Home / Self Care | Payer: Self-pay | Source: Ambulatory Visit | Attending: Cardiology | Admitting: Cardiology

## 2019-10-04 ENCOUNTER — Telehealth (HOSPITAL_COMMUNITY): Payer: Self-pay

## 2019-10-04 DIAGNOSIS — G4733 Obstructive sleep apnea (adult) (pediatric): Secondary | ICD-10-CM | POA: Diagnosis not present

## 2019-10-04 NOTE — Progress Notes (Signed)
Virtual Cardiac Rehab Note:  Successful telephone encounter to Eric Ball to confirm Cardiac Rehab orientation appointment for 10/08/19 at 1:15. Nursing assessment completed. Instructions for appointment provided. Patient screening for Covid-19 negative. Patient has agreed to Virtual Cardiac Rehab utilizing the [employer] app secondary to high co-pay per CR in-person exercise session (see consent below). Invitation to join the [employer] app sent via text message to [phone].  Checklist:  1. Pt has smart device  ie smartphone and/or ipad for downloading an app  Yes 2. Reliable internet/wifi service    Yes 3. Understands how to use their smartphone and navigate within an app.  Yes             Confirm Consent - In the setting of the current Covid19 crisis, you are scheduled for a phone visit with your Cardiac or Pulmonary team member.  Just as we do with many in-gym visits, in order for you to participate in this visit, we must obtain consent.  If you'd like, I can send this to your mychart (if signed up) or email for you to review.  Otherwise, I can obtain your verbal consent now.  By agreeing to a telephone visit, we'd like you to understand that the technology does not allow for your Cardiac or Pulmonary Rehab team member to perform a physical assessment, and thus may limit their ability to fully assess your ability to perform exercise programs. If your provider identifies any concerns that need to be evaluated in person, we will make arrangements to do so.  Finally, though the technology is pretty good, we cannot assure that it will always work on either your or our end and we cannot ensure that we have a secure connection.  Cardiac and Pulmonary Rehab Telehealth visits and "At Home" cardiac and pulmonary rehab are provided at no cost to you.        Are you willing to proceed?"        STAFF: Did the patient verbally acknowledge consent to telehealth visit? Document YES/NO here:  Yes     Aqueelah Cotrell E. Rollene Rotunda, RN  Cardiac and Pulmonary Rehab Staff        Date 10/04/19 @ Time 4:37

## 2019-10-07 NOTE — Telephone Encounter (Signed)
Cardiac Rehab Medication Review by a Pharmacist  Medication history was already completed on 10/03/2019 by Phill Mutter, MD.     Henri Medal 10/07/2019 4:34 PM

## 2019-10-08 ENCOUNTER — Encounter (HOSPITAL_COMMUNITY)
Admission: RE | Admit: 2019-10-08 | Discharge: 2019-10-08 | Disposition: A | Discharge status: Home / Self Care | Payer: Medicare HMO | Source: Ambulatory Visit | Attending: Cardiology | Admitting: Cardiology

## 2019-10-08 ENCOUNTER — Other Ambulatory Visit: Payer: Self-pay

## 2019-10-08 VITALS — BP 122/70 | HR 75 | Temp 97.0°F | Ht 69.0 in | Wt 167.5 lb

## 2019-10-08 DIAGNOSIS — Z7902 Long term (current) use of antithrombotics/antiplatelets: Secondary | ICD-10-CM | POA: Diagnosis not present

## 2019-10-08 DIAGNOSIS — Z7982 Long term (current) use of aspirin: Secondary | ICD-10-CM | POA: Diagnosis not present

## 2019-10-08 DIAGNOSIS — Z955 Presence of coronary angioplasty implant and graft: Secondary | ICD-10-CM | POA: Diagnosis not present

## 2019-10-08 DIAGNOSIS — I10 Essential (primary) hypertension: Secondary | ICD-10-CM | POA: Insufficient documentation

## 2019-10-08 DIAGNOSIS — I2510 Atherosclerotic heart disease of native coronary artery without angina pectoris: Secondary | ICD-10-CM | POA: Insufficient documentation

## 2019-10-08 DIAGNOSIS — E119 Type 2 diabetes mellitus without complications: Secondary | ICD-10-CM | POA: Insufficient documentation

## 2019-10-08 DIAGNOSIS — K219 Gastro-esophageal reflux disease without esophagitis: Secondary | ICD-10-CM | POA: Insufficient documentation

## 2019-10-08 DIAGNOSIS — G4730 Sleep apnea, unspecified: Secondary | ICD-10-CM | POA: Insufficient documentation

## 2019-10-08 DIAGNOSIS — Z79899 Other long term (current) drug therapy: Secondary | ICD-10-CM | POA: Diagnosis not present

## 2019-10-08 DIAGNOSIS — Z7984 Long term (current) use of oral hypoglycemic drugs: Secondary | ICD-10-CM | POA: Diagnosis not present

## 2019-10-08 LAB — GLUCOSE, CAPILLARY: Glucose-Capillary: 115 mg/dL — ABNORMAL HIGH (ref 70–99)

## 2019-10-08 NOTE — Progress Notes (Addendum)
Cardiac Individual Treatment Plan  Patient Details  Name: Eric Ball MRN: [id number] Date of Birth: 02-Aug-1950 Referring Provider:     [place] from 10/08/2019 in [place]  Referring Provider  [name], MD      Initial Encounter Date:    CARDIAC REHAB PHASE II ORIENTATION from 10/08/2019 in [place]  Date  10/08/19      Visit Diagnosis: S/P DES LAD 09/09/19  Patient's Home Medications on Admission:  Current Outpatient Medications:  .  Alirocumab (PRALUENT) 150 MG/ML SOAJ, Inject 1 pen into the skin every 14 (fourteen) days., Disp: 6 pen, Rfl: 3 .  aspirin EC 81 MG EC tablet, Take 1 tablet (81 mg total) by mouth daily., Disp: 90 tablet, Rfl: 3 .  carvedilol (COREG) 12.5 MG tablet, Take 1 tablet (12.5 mg total) by mouth 2 (two) times daily with a meal. Please keep upcoming appointment, Disp: 180 tablet, Rfl: 1 .  glimepiride (AMARYL) 1 MG tablet, Take 2 tablets by mouth daily as needed (blood glucose levels). , Disp: , Rfl:  .  metFORMIN (GLUCOPHAGE) 1000 MG tablet, Take 1 tablet (1,000 mg total) by mouth 2 (two) times daily with a meal. Resume metformin on 09/12/19., Disp: 180 tablet, Rfl: 3 .  nitroGLYCERIN (NITROSTAT) 0.4 MG SL tablet, Place 1 tablet (0.4 mg total) under the tongue every 5 (five) minutes as needed for chest pain., Disp: 25 tablet, Rfl: 3 .  omega-3 acid ethyl esters (LOVAZA) 1 g capsule, TAKE 2 CAPSULES BY MOUTH TWICE A DAY (Patient taking differently: Take 2 g by mouth 2 (two) times daily. ), Disp: 360 capsule, Rfl: 0 .  pantoprazole (PROTONIX) 40 MG tablet, Take 1 tablet (40 mg total) by mouth daily., Disp: 90 tablet, Rfl: 3 .  tamsulosin (FLOMAX) 0.4 MG CAPS capsule, Take 0.4 mg by mouth daily., Disp: , Rfl:  .  ticagrelor (BRILINTA) 90 MG TABS tablet, Take 1 tablet (90 mg total) by mouth 2 (two) times daily., Disp: 180 tablet, Rfl: 3 .  valsartan (DIOVAN) 160 MG  tablet, TAKE 1 TABLET BY MOUTH EVERY DAY, Disp: 90 tablet, Rfl: 3  Past Medical History: Past Medical History:  Diagnosis Date  . Complication of anesthesia    "I'm slow to come out"  . Coronary artery disease 2015   PTCA with DES to Cx; PCI  BMS 2004; LHC 08/2019 DES to LAD  . GERD (gastroesophageal reflux disease)   . H/O hiatal hernia   . History of myocardial infarction    with a stent placed at that time  . Hypercholesterolemia    with intolerant ot many of the cholesterol medicaitons  . Hypertension    essentai hypertension  . Ischemic heart disease   . Myocardial infarction [facility]) 2004   with a stent placed at that time  . Nephrosis  1960's  . Sleep apnea    "suppose to wear mask; I don't wear it enough" (11/15/2013)  . Type II diabetes mellitus (HCC)     Tobacco Use: Social History   Tobacco Use  Smoking Status Never Smoker  Smokeless Tobacco Never Used  Tobacco Comment   Never smoker    Labs: Recent Review Flowsheet Data    Labs for ITP Cardiac and Pulmonary Rehab Latest Ref Rng & Units 07/03/2017 07/03/2017 09/19/2017 07/09/2018 09/08/2019   Cholestrol 0 - 200 mg/dL - - 160 150 151   LDLCALC 0 - 99 mg/dL - -  72 76 69   LDLDIRECT mg/dL - - - - -   HDL >40 mg/dL - - 52 47 43   Trlycerides <150 mg/dL - - 179(H) 135 196(H)   Hemoglobin A1c 4.8 - 5.6 % - - - - 7.4(H)   PHART 7.350 - 7.450 - 7.346(L) - - -   PCO2ART 32.0 - 48.0 mmHg - 34.3 - - -   HCO3 20.0 - 28.0 mmol/L 20.0 18.8(L) - - -   TCO2 22 - 32 mmol/L 21(L) 20(L) - - -   ACIDBASEDEF 0.0 - 2.0 mmol/L 6.0(H) 6.0(H) - - -   O2SAT % 68.0 98.0 - - -      Capillary Blood Glucose: Lab Results  Component Value Date   GLUCAP 115 (H) 10/08/2019   GLUCAP 156 (H) 09/10/2019   GLUCAP 181 (H) 09/09/2019   GLUCAP 112 (H) 09/09/2019   GLUCAP 138 (H) 09/09/2019     Exercise Target Goals: Exercise Program Goal: Individual exercise prescription set using results from initial 6 min walk test and THRR while  considering  patient's activity barriers and safety.   Exercise Prescription Goal: Starting with aerobic activity 30 plus minutes a day, 3 days per week for initial exercise prescription. Provide home exercise prescription and guidelines that participant acknowledges understanding prior to discharge.  Activity Barriers & Risk Stratification: Activity Barriers & Cardiac Risk Stratification - 10/08/19 1342      Activity Barriers & Cardiac Risk Stratification   Activity Barriers  None    Cardiac Risk Stratification  Moderate       6 Minute Walk: 6 Minute Walk    Row Name 10/08/19 1359         6 Minute Walk   Phase  Initial     Distance  1394 feet     Walk Time  6 minutes     # of Rest Breaks  0     MPH  2.64     METS  3.34     RPE  11     Perceived Dyspnea   0     VO2 Peak  11.68     Symptoms  No     Resting HR  75 bpm     Resting BP  122/70     Resting Oxygen Saturation   97 %     Exercise Oxygen Saturation  during 6 min walk  97 %     Max Ex. HR  97 bpm     Max Ex. BP  122/82     2 Minute Post BP  118/76        Oxygen Initial Assessment:   Oxygen Re-Evaluation:   Oxygen Discharge (Final Oxygen Re-Evaluation):   Initial Exercise Prescription: Initial Exercise Prescription - 10/08/19 1500      Date of Initial Exercise RX and Referring Provider   Date  10/08/19    Referring Provider  [name], MD    Expected Discharge Date  12/06/19      Track   Minutes  30      Prescription Details   Frequency (times per week)  7    Duration  Progress to 45 minutes of aerobic exercise without signs/symptoms of physical distress      Intensity   THRR 40-80% of Max Heartrate  61-122    Ratings of Perceived Exertion  11-13    Perceived Dyspnea  0-4      Progression   Progression  Continue to progress workloads to  maintain intensity without signs/symptoms of physical distress.      Resistance Training   Training Prescription  Yes    Weight  5lbs.    Reps   10-15       Perform Capillary Blood Glucose checks as needed.  Exercise Prescription Changes:   Exercise Comments:  Exercise Comments    Row Name 10/08/19 1340           Exercise Comments  Reviewed home exercise plan with patient.          Exercise Goals and Review:  Exercise Goals    Row Name 10/08/19 1340             Exercise Goals   Increase Physical Activity  Yes       Intervention  Provide advice, education, support and counseling about physical activity/exercise needs.;Develop an individualized exercise prescription for aerobic and resistive training based on initial evaluation findings, risk stratification, comorbidities and participant's personal goals.       Expected Outcomes  Short Term: Attend rehab on a regular basis to increase amount of physical activity.;Long Term: Exercising regularly at least 3-5 days a week.;Long Term: Add in home exercise to make exercise part of routine and to increase amount of physical activity.       Increase Strength and Stamina  Yes       Intervention  Provide advice, education, support and counseling about physical activity/exercise needs.;Develop an individualized exercise prescription for aerobic and resistive training based on initial evaluation findings, risk stratification, comorbidities and participant's personal goals.       Expected Outcomes  Short Term: Increase workloads from initial exercise prescription for resistance, speed, and METs.;Short Term: Perform resistance training exercises routinely during rehab and add in resistance training at home       Able to understand and use rate of perceived exertion (RPE) scale  Yes       Intervention  Provide education and explanation on how to use RPE scale       Expected Outcomes  Short Term: Able to use RPE daily in rehab to express subjective intensity level;Long Term:  Able to use RPE to guide intensity level when exercising independently       Knowledge and understanding of  Target Heart Rate Range (THRR)  Yes       Intervention  Provide education and explanation of THRR including how the numbers were predicted and where they are located for reference       Expected Outcomes  Short Term: Able to state/look up THRR;Long Term: Able to use THRR to govern intensity when exercising independently;Short Term: Able to use daily as guideline for intensity in rehab       Able to check pulse independently  Yes       Intervention  Provide education and demonstration on how to check pulse in carotid and radial arteries.;Review the importance of being able to check your own pulse for safety during independent exercise       Expected Outcomes  Long Term: Able to check pulse independently and accurately;Short Term: Able to explain why pulse checking is important during independent exercise       Understanding of Exercise Prescription  Yes       Intervention  Provide education, explanation, and written materials on patient's individual exercise prescription       Expected Outcomes  Short Term: Able to explain program exercise prescription;Long Term: Able to explain home exercise prescription to exercise independently  Exercise Goals Re-Evaluation :    Discharge Exercise Prescription (Final Exercise Prescription Changes):   Nutrition:  Target Goals: Understanding of nutrition guidelines, daily intake of sodium 1500mg , cholesterol 200mg , calories 30% from fat and 7% or less from saturated fats, daily to have 5 or more servings of fruits and vegetables.  Biometrics: Pre Biometrics - 10/08/19 1330      Pre Biometrics   Height  5\' 9"  (1.753 m)    Weight  76 kg    Waist Circumference  39.25 inches    Hip Circumference  38 inches    Waist to Hip Ratio  1.03 %    BMI (Calculated)  24.73    Triceps Skinfold  11 mm    % Body Fat  24.8 %    Grip Strength  49 kg    Flexibility  10.75 in    Single Leg Stand  18.37 seconds        Nutrition Therapy Plan and Nutrition  Goals:   Nutrition Assessments:   Nutrition Goals Re-Evaluation:   Nutrition Goals Discharge (Final Nutrition Goals Re-Evaluation):   Psychosocial: Target Goals: Acknowledge presence or absence of significant depression and/or stress, maximize coping skills, provide positive support system. Participant is able to verbalize types and ability to use techniques and skills needed for reducing stress and depression.  Initial Review & Psychosocial Screening: Initial Psych Review & Screening - 10/08/19 1621      Initial Review   Current issues with  None Identified      Family Dynamics   Good Support System?  Yes      Barriers   Psychosocial barriers to participate in program  There are no identifiable barriers or psychosocial needs.      Screening Interventions   Interventions  Encouraged to exercise       Quality of Life Scores: Quality of Life - 10/08/19 1531      Quality of Life   Select  Quality of Life      Quality of Life Scores   Health/Function Pre  26.87 %    Socioeconomic Pre  30 %    Psych/Spiritual Pre  30 %    Family Pre  30 %    GLOBAL Pre  28.66 %      Scores of 19 and below usually indicate a poorer quality of life in these areas.  A difference of  2-3 points is a clinically meaningful difference.  A difference of 2-3 points in the total score of the Quality of Life Index has been associated with significant improvement in overall quality of life, self-image, physical symptoms, and general health in studies assessing change in quality of life.  PHQ-9: Recent Review Flowsheet Data    Depression screen [facility] 2/9 10/08/2019   Decreased Interest 0   Down, Depressed, Hopeless 0   PHQ - 2 Score 0     Interpretation of Total Score  Total Score Depression Severity:  1-4 = Minimal depression, 5-9 = Mild depression, 10-14 = Moderate depression, 15-19 = Moderately severe depression, 20-27 = Severe depression   Psychosocial Evaluation and  Intervention:   Psychosocial Re-Evaluation:   Psychosocial Discharge (Final Psychosocial Re-Evaluation):   Vocational Rehabilitation: Provide vocational rehab assistance to qualifying candidates.   Vocational Rehab Evaluation & Intervention: Vocational Rehab - 10/08/19 1623      Initial Vocational Rehab Evaluation & Intervention   Assessment shows need for Vocational Rehabilitation  No       Education: Education Goals: Education classes  will be provided on a weekly basis, covering required topics. Participant will state understanding/return demonstration of topics presented.  Learning Barriers/Preferences: Learning Barriers/Preferences - 10/08/19 1621      Learning Barriers/Preferences   Learning Barriers  None    Learning Preferences  Audio;Computer/Internet;Group Instruction;Individual Instruction;Pictoral;Skilled Demonstration;Verbal Instruction;Video;Written Material       Education Topics: Hypertension, Hypertension Reduction -Define heart disease and high blood pressure. Discus how high blood pressure affects the body and ways to reduce high blood pressure.   Exercise and Your Heart -Discuss why it is important to exercise, the FITT principles of exercise, normal and abnormal responses to exercise, and how to exercise safely.   Angina -Discuss definition of angina, causes of angina, treatment of angina, and how to decrease risk of having angina.   Cardiac Medications -Review what the following cardiac medications are used for, how they affect the body, and side effects that may occur when taking the medications.  Medications include Aspirin, Beta blockers, calcium channel blockers, ACE Inhibitors, angiotensin receptor blockers, diuretics, digoxin, and antihyperlipidemics.   Congestive Heart Failure -Discuss the definition of CHF, how to live with CHF, the signs and symptoms of CHF, and how keep track of weight and sodium intake.   Heart Disease and  Intimacy -Discus the effect sexual activity has on the heart, how changes occur during intimacy as we age, and safety during sexual activity.   Smoking Cessation / COPD -Discuss different methods to quit smoking, the health benefits of quitting smoking, and the definition of COPD.   Nutrition I: Fats -Discuss the types of cholesterol, what cholesterol does to the heart, and how cholesterol levels can be controlled.   Nutrition II: Labels -Discuss the different components of food labels and how to read food label   Heart Parts/Heart Disease and PAD -Discuss the anatomy of the heart, the pathway of blood circulation through the heart, and these are affected by heart disease.   Stress I: Signs and Symptoms -Discuss the causes of stress, how stress may lead to anxiety and depression, and ways to limit stress.   Stress II: Relaxation -Discuss different types of relaxation techniques to limit stress.   Warning Signs of Stroke / TIA -Discuss definition of a stroke, what the signs and symptoms are of a stroke, and how to identify when someone is having stroke.   Knowledge Questionnaire Score: Knowledge Questionnaire Score - 10/08/19 1532      Knowledge Questionnaire Score   Pre Score  22/24       Core Components/Risk Factors/Patient Goals at Admission: Personal Goals and Risk Factors at Admission - 10/08/19 1623      Core Components/Risk Factors/Patient Goals on Admission    Weight Management  Yes;Weight Maintenance    Intervention  Weight Management: Develop a combined nutrition and exercise program designed to reach desired caloric intake, while maintaining appropriate intake of nutrient and fiber, sodium and fats, and appropriate energy expenditure required for the weight goal.;Weight Management: Provide education and appropriate resources to help participant work on and attain dietary goals.    Diabetes  Yes    Intervention  Provide education about signs/symptoms and action  to take for hypo/hyperglycemia.;Provide education about proper nutrition, including hydration, and aerobic/resistive exercise prescription along with prescribed medications to achieve blood glucose in normal ranges: Fasting glucose 65-99 mg/dL    Expected Outcomes  Short Term: Participant verbalizes understanding of the signs/symptoms and immediate care of hyper/hypoglycemia, proper foot care and importance of medication, aerobic/resistive exercise and nutrition plan  for blood glucose control.;Long Term: Attainment of HbA1C < 7%.    Hypertension  Yes    Intervention  Provide education on lifestyle modifcations including regular physical activity/exercise, weight management, moderate sodium restriction and increased consumption of fresh fruit, vegetables, and low fat dairy, alcohol moderation, and smoking cessation.;Monitor prescription use compliance.    Expected Outcomes  Short Term: Continued assessment and intervention until BP is < 140/60mm HG in hypertensive participants. < 130/65mm HG in hypertensive participants with diabetes, heart failure or chronic kidney disease.;Long Term: Maintenance of blood pressure at goal levels.    Lipids  Yes    Intervention  Provide education and support for participant on nutrition & aerobic/resistive exercise along with prescribed medications to achieve LDL 70mg , HDL >40mg .    Expected Outcomes  Short Term: Participant states understanding of desired cholesterol values and is compliant with medications prescribed. Participant is following exercise prescription and nutrition guidelines.;Long Term: Cholesterol controlled with medications as prescribed, with individualized exercise RX and with personalized nutrition plan. Value goals: LDL < 70mg , HDL > 40 mg.       Core Components/Risk Factors/Patient Goals Review:    Core Components/Risk Factors/Patient Goals at Discharge (Final Review):    ITP Comments: ITP Comments    Row Name 10/08/19 1609            ITP Comments  Dr Fransico Him MD, Medical Director          Comments:Markie attended orientation on 10/08/2019 to review rules and guidelines for program.  Completed 6 minute walk test, Intitial ITP, and exercise prescription.  VSS. Telemetry-Sinus Rhythm.  Asymptomatic. Safety measures and social distancing in place per CDC guidelines.Zakhai has already downloaded the virtual cardiac rehab better [facility] APP for virtual cardiac rehab.Barnet Pall, RN,BSN 10/08/2019 4:32 PM

## 2019-10-08 NOTE — Progress Notes (Signed)
Patient will participate in the virtual cardiac rehab program via the [employer] app. Reviewed home exercise guidelines with patient including endpoints, temperature precautions, target heart rate and rate of perceived exertion. Patient is walking 10 minutes 3-4 times daily, walking his dog as his mode of home exercise. Patient has 5 lb weights at home for his resistance training. Patient has a pulse oximeter at home to monitor his heart rate. Pt voices understanding of instructions given.  Sol Passer, MS, ACSM CEP

## 2019-10-10 ENCOUNTER — Other Ambulatory Visit: Payer: Self-pay

## 2019-10-10 ENCOUNTER — Encounter (HOSPITAL_COMMUNITY)
Admission: RE | Admit: 2019-10-10 | Discharge: 2019-10-10 | Disposition: A | Discharge status: Home / Self Care | Payer: Medicare HMO | Source: Ambulatory Visit | Attending: Cardiology | Admitting: Cardiology

## 2019-10-10 NOTE — Progress Notes (Signed)
Virtual Cardiac Rehab: Nutrition Note  Successful phone encounter with pt to schedule a virtual nutrition assessment. Pt requested a call with he and his wife. Scheduled for March 19th at 3 pm. Informed pt that this will be a telephone visit. He requested a call on home phone. Pt has no questions or concerns today.   Michaele Offer, MS, RDN, LDN

## 2019-10-11 ENCOUNTER — Other Ambulatory Visit: Payer: Self-pay

## 2019-10-11 ENCOUNTER — Encounter (HOSPITAL_COMMUNITY)
Admission: RE | Admit: 2019-10-11 | Discharge: 2019-10-11 | Disposition: A | Discharge status: Home / Self Care | Payer: Medicare HMO | Source: Ambulatory Visit | Attending: Cardiology | Admitting: Cardiology

## 2019-10-11 NOTE — Progress Notes (Addendum)
Eric Ball 69 y.o. male Nutrition Note  Visit Diagnosis: S/P DES LAD 09/09/19  Past Medical History:  Diagnosis Date  . Complication of anesthesia    "I'm slow to come out"  . Coronary artery disease 2015   PTCA with DES to Cx; PCI  BMS 2004; LHC 08/2019 DES to LAD  . GERD (gastroesophageal reflux disease)   . H/O hiatal hernia   . History of myocardial infarction    with a stent placed at that time  . Hypercholesterolemia    with intolerant ot many of the cholesterol medicaitons  . Hypertension    essentai hypertension  . Ischemic heart disease   . Myocardial infarction [facility]) 2004   with a stent placed at that time  . Nephrosis  1960's  . Sleep apnea    "suppose to wear mask; I don't wear it enough" (11/15/2013)  . Type II diabetes mellitus (HCC)      Medications reviewed.   Current Outpatient Medications:  .  Alirocumab (PRALUENT) 150 MG/ML SOAJ, Inject 1 pen into the skin every 14 (fourteen) days., Disp: 6 pen, Rfl: 3 .  aspirin EC 81 MG EC tablet, Take 1 tablet (81 mg total) by mouth daily., Disp: 90 tablet, Rfl: 3 .  carvedilol (COREG) 12.5 MG tablet, Take 1 tablet (12.5 mg total) by mouth 2 (two) times daily with a meal. Please keep upcoming appointment, Disp: 180 tablet, Rfl: 1 .  glimepiride (AMARYL) 1 MG tablet, Take 2 tablets by mouth daily as needed (blood glucose levels). , Disp: , Rfl:  .  metFORMIN (GLUCOPHAGE) 1000 MG tablet, Take 1 tablet (1,000 mg total) by mouth 2 (two) times daily with a meal. Resume metformin on 09/12/19., Disp: 180 tablet, Rfl: 3 .  nitroGLYCERIN (NITROSTAT) 0.4 MG SL tablet, Place 1 tablet (0.4 mg total) under the tongue every 5 (five) minutes as needed for chest pain., Disp: 25 tablet, Rfl: 3 .  omega-3 acid ethyl esters (LOVAZA) 1 g capsule, TAKE 2 CAPSULES BY MOUTH TWICE A DAY (Patient taking differently: Take 2 g by mouth 2 (two) times daily. ), Disp: 360 capsule, Rfl: 0 .  pantoprazole (PROTONIX) 40 MG tablet, Take 1 tablet (40 mg  total) by mouth daily., Disp: 90 tablet, Rfl: 3 .  tamsulosin (FLOMAX) 0.4 MG CAPS capsule, Take 0.4 mg by mouth daily., Disp: , Rfl:  .  ticagrelor (BRILINTA) 90 MG TABS tablet, Take 1 tablet (90 mg total) by mouth 2 (two) times daily., Disp: 180 tablet, Rfl: 3 .  valsartan (DIOVAN) 160 MG tablet, TAKE 1 TABLET BY MOUTH EVERY DAY, Disp: 90 tablet, Rfl: 3   Ht Readings from Last 1 Encounters:  10/08/19 5\' 9"  (1.753 m)     Wt Readings from Last 3 Encounters:  10/08/19 167 lb 8.8 oz (76 kg)  10/03/19 172 lb (78 kg)  09/19/19 167 lb 12.8 oz (76.1 kg)     There is no height or weight on file to calculate BMI.   Social History   Tobacco Use  Smoking Status Never Smoker  Smokeless Tobacco Never Used  Tobacco Comment   Never smoker     Lab Results  Component Value Date   CHOL 151 09/08/2019   Lab Results  Component Value Date   HDL 43 09/08/2019   Lab Results  Component Value Date   LDLCALC 69 09/08/2019   Lab Results  Component Value Date   TRIG 196 (H) 09/08/2019     Lab Results  Component  Value Date   HGBA1C 7.4 (H) 09/08/2019     CBG (last 3)  No results for input(s): GLUCAP in the last 72 hours.   Nutrition Note  Spoke with pt. Nutrition Plan and Nutrition Survey goals reviewed with pt. Pt is interested in making diet changes to follow a heart healthy diet.  Pt has Type 2 Diabetes.Pt checks CBG's 1 times a day. Fasting CBG's reportedly 120-130 mg/dL. He occasionally checks CBGs in the afternoon and often it is around 200 mg/dl.    Pt expressed understanding of the information reviewed.    Nutrition Diagnosis ? Excessive carbohydrate intake related to consumption of convenience foods and sugary beverages as evidenced by A1C 7.4 and post prandial CBG 200 mg/dl and triglycerides 196 mg/dl  Nutrition Intervention ? Pt's individual nutrition plan reviewed with pt. ? Benefits of adopting Heart Healthy diet discussed when Medficts reviewed.   ? Recommend  60-75 g carbs per meal  ? Recommend checking post prandial CBGs ? Pt given handouts for: ? Nutrition I class ?  ? Continue client-centered nutrition education by RD, as part of interdisciplinary care.  Goal(s) ? CBG concentrations in the normal range or as close to normal as is safely possible. ? Improved blood glucose control as evidenced by pt's A1c trending from 7.4 toward less than 7.0. Plan:  Will provide client-centered nutrition education as part of interdisciplinary care  Monitor and evaluate progress toward nutrition goal with team.   Michaele Offer, MS, RDN, LDN

## 2019-10-16 ENCOUNTER — Other Ambulatory Visit: Payer: Self-pay

## 2019-10-16 ENCOUNTER — Encounter (HOSPITAL_COMMUNITY)
Admission: RE | Admit: 2019-10-16 | Discharge: 2019-10-16 | Disposition: A | Discharge status: Home / Self Care | Payer: Self-pay | Source: Ambulatory Visit | Attending: Cardiology | Admitting: Cardiology

## 2019-10-16 NOTE — Progress Notes (Signed)
Virtual Cardiac Rehab Note: ([employer] App chat messaging)  Hi Eric Ball, I see you had some shortness of breath with exercise yesterday. Is this something new for you and did it resolve with rest? Eric Ball 10/16/19, 11:53 AM I'm fine. I was probably doing something I wasn't supposed to do loading some firewood in the back of my truck. But it was good exercise and I'm fine  10/16/19, 12:35 PM [name] I want you to listen to your body Eric Ball. It will tell you when you are doing too much. If you have any type of discomfort during exertion your body is probably telling you to slow down or stop the activity. Endpoints to exercise are angina, shortness of breath, extreme fatigue, cold sweat, choking sensation, nausea or vomiting, palpitations, dizziness, or muscle strain. If you have angina (chest discomfort) then sit down and take your nitroglycerine as prescribed. As the weather gets warmer, you do not need to exercise outdoors OR do any heavy lifting/exertion if it is over 85 degrees or the humidity is > 75%. I am very happy that you are making changes to decrease your cardiovascular risk factors ??  Eric Ball E. Rollene Rotunda RN, BSN . [facility]  Cardiac and Pulmonary Rehabilitation Phone: [phone] Fax: [phone]

## 2019-10-18 ENCOUNTER — Encounter (HOSPITAL_COMMUNITY)
Admission: RE | Admit: 2019-10-18 | Discharge: 2019-10-18 | Disposition: A | Discharge status: Home / Self Care | Payer: Medicare HMO | Source: Ambulatory Visit | Attending: Cardiology | Admitting: Cardiology

## 2019-10-18 ENCOUNTER — Other Ambulatory Visit: Payer: Self-pay

## 2019-10-18 NOTE — Progress Notes (Signed)
Virtual Cardiac Rehab Nutrition Note  Spoke with pt over the phone.  Reviewed diet recall. Pt has been decreasing portions. He has not counted carbs or read labels to decrease sodium. He has decreased fried foods and red meats. Fasting CBGs 120-130 mg/dl.  He did check 2 post prandial CBGs.164 mg/dl and 142 mg/dl. Reviewed his current A1C again.  Reviewed nutrition goals. Will continue to monitor pt with monthly calls for VCR.  Michaele Offer, MS, RDN, LDN

## 2019-10-28 ENCOUNTER — Other Ambulatory Visit: Payer: Self-pay

## 2019-10-28 ENCOUNTER — Encounter (HOSPITAL_COMMUNITY)
Admission: RE | Admit: 2019-10-28 | Discharge: 2019-10-28 | Disposition: A | Discharge status: Home / Self Care | Payer: Self-pay | Source: Ambulatory Visit | Attending: Cardiology | Admitting: Cardiology

## 2019-10-28 DIAGNOSIS — Z955 Presence of coronary angioplasty implant and graft: Secondary | ICD-10-CM

## 2019-10-28 NOTE — Progress Notes (Signed)
Virtual Cardiac Rehab Note:  Successful telephone encounter to [name]. Fawaz to follow up on foot pain documented in the [employer] [facility] Virtual Cardiac Rehab App. According to Mr. Mcclaskey, this is new onset pain located on the medial aspect of his foot "where my heal meets the arch". He feels this is plantar fascitis and is attempting to self treat utilizing repetitive foot massage by rolling the arch of his foot over a frozen water bottle. RN recommending patient contact PCP for appropriate diagnosis of this self treatment does not improve symptoms within the next 3-4 days. Patient agreeable.   Plan: Will follow up with patient within 1 week  Lashannon Bresnan E. Rollene Rotunda RN, BSN [place]. [facility]  Cardiac and Pulmonary Rehabilitation Phone: [phone] Fax: [phone]

## 2019-11-04 DIAGNOSIS — G4733 Obstructive sleep apnea (adult) (pediatric): Secondary | ICD-10-CM | POA: Diagnosis not present

## 2019-11-07 ENCOUNTER — Encounter: Payer: Self-pay | Admitting: Internal Medicine

## 2019-11-08 ENCOUNTER — Encounter (HOSPITAL_COMMUNITY): Payer: Self-pay

## 2019-11-11 ENCOUNTER — Telehealth: Payer: Self-pay | Admitting: *Deleted

## 2019-11-11 DIAGNOSIS — D649 Anemia, unspecified: Secondary | ICD-10-CM | POA: Diagnosis not present

## 2019-11-11 DIAGNOSIS — E039 Hypothyroidism, unspecified: Secondary | ICD-10-CM | POA: Diagnosis not present

## 2019-11-11 DIAGNOSIS — E538 Deficiency of other specified B group vitamins: Secondary | ICD-10-CM | POA: Diagnosis not present

## 2019-11-11 DIAGNOSIS — Z125 Encounter for screening for malignant neoplasm of prostate: Secondary | ICD-10-CM | POA: Diagnosis not present

## 2019-11-11 DIAGNOSIS — E663 Overweight: Secondary | ICD-10-CM | POA: Diagnosis not present

## 2019-11-11 DIAGNOSIS — I2510 Atherosclerotic heart disease of native coronary artery without angina pectoris: Secondary | ICD-10-CM | POA: Diagnosis not present

## 2019-11-11 DIAGNOSIS — E7849 Other hyperlipidemia: Secondary | ICD-10-CM | POA: Diagnosis not present

## 2019-11-11 DIAGNOSIS — E1142 Type 2 diabetes mellitus with diabetic polyneuropathy: Secondary | ICD-10-CM | POA: Diagnosis not present

## 2019-11-11 DIAGNOSIS — E1165 Type 2 diabetes mellitus with hyperglycemia: Secondary | ICD-10-CM | POA: Diagnosis not present

## 2019-11-11 DIAGNOSIS — E119 Type 2 diabetes mellitus without complications: Secondary | ICD-10-CM | POA: Diagnosis not present

## 2019-11-11 DIAGNOSIS — Z6825 Body mass index (BMI) 25.0-25.9, adult: Secondary | ICD-10-CM | POA: Diagnosis not present

## 2019-11-11 DIAGNOSIS — N400 Enlarged prostate without lower urinary tract symptoms: Secondary | ICD-10-CM | POA: Diagnosis not present

## 2019-11-11 DIAGNOSIS — Z0000 Encounter for general adult medical examination without abnormal findings: Secondary | ICD-10-CM | POA: Diagnosis not present

## 2019-11-11 DIAGNOSIS — Z1389 Encounter for screening for other disorder: Secondary | ICD-10-CM | POA: Diagnosis not present

## 2019-11-11 DIAGNOSIS — I10 Essential (primary) hypertension: Secondary | ICD-10-CM | POA: Diagnosis not present

## 2019-11-11 DIAGNOSIS — G4733 Obstructive sleep apnea (adult) (pediatric): Secondary | ICD-10-CM | POA: Diagnosis not present

## 2019-11-11 NOTE — Telephone Encounter (Signed)
Patient called to reports brilinta is not covered by his insurance and cost too much. Samples placed at the front desk for patient pick up and patient assistance form placed in the bag.

## 2019-11-14 ENCOUNTER — Telehealth: Payer: Self-pay | Admitting: *Deleted

## 2019-11-14 DIAGNOSIS — R972 Elevated prostate specific antigen [PSA]: Secondary | ICD-10-CM | POA: Diagnosis not present

## 2019-11-14 NOTE — Telephone Encounter (Signed)
COORDINATE-Diabetes 12 Month CASE REPORT FORM (Intervention) -EHR Site #:   [id number]              Patient ID:  [id number]    6 MONTH EHR REVIEW  Medical Record Check Date 10/31/2019   Vital Status [x] Patient Alive >Date last known alive per EHR:11/14/2019  [] Patient Dead >> Complete Death Form  [] Unknown   CLINICAL EVENTS / PROCEDURES  Hospitalization since last visit? (>=24 hour stay) [] No [x] Yes  >> if yes, Complete the following  Date of hospital admission:  02  /13 /2021   MM DD YYYY  Primary discharge diagnosis:  *Complete appropriate event validation form [x] acute myocardial infarction (heart attack)* [] stroke* [] heart failure* [] coronary revascularization* [] peripheral revascularization* [] cerebral revascularization* [] diabetes (e.g. hypoglycemia, DKA) [] renal failure [] amputation [] other cardiovascular reason [] other NON-cardiovascular reason [] unknown  Other diagnoses not documented above: (check all that apply)  *Complete appropriate event validation form [] acute myocardial infarction (heart attack)* [] stroke* [] heart failure* [] coronary revascularization* [] peripheral revascularization* [] cerebral revascularization* [] diabetes (e.g. hypoglycemia, DKA) [] renal failure [] amputation [] other cardiovascular reason [] other NON-cardiovascular reason [] unknown  Were any of the following outpatient procedures done since the last visit? (I.e. procedures not captured above)  Coronary revascularization [x] No [] Yes >IF YES, Date / /             MM DD YYYY  Peripheral revascularization [x] No [] Yes > IF YES, Date / /             MM DD YYYY  Cerebral revascularization [x] No [] Yes > IF YES, Date / /             MM DD YYYY  Extremity amputation    [x] No [] Yes >IF YES, Date / /             MM      DD       YYYY  Renal replacement therapy (i.e. dialysis)    [x] No [] Yes > IF YES, Date of initiation / /             MM      DD       YYYY   **EDC will allow for collection  of multiple hospitalizations and procedures   MEDICATIONS  Medication Currently Prescribed? If started since last visit: If not started since last visit: If stopped since last visit:  Cardiac Medications  ACE Inhibitor / Angiotensin Receptor Blocker (ARB) / Angiotensin Receptor Neprilysin inhibitor (ARNi)  [] No >  [x] Yes > Since last visit, medication was: [] Stopped [] Not started [] Started [x] Continued same medication [] Continued with medication changes Date started:        / /             MM DD YYYY Who prescribed? [] Cardiology provider  [] Study clinic [] Outside clinic [] Endocrinology provider [] Primary care provider [] Other provider  Specify:                             [] Unknown Reason (check all that apply): [] History of swelling around lips, eyes or face [] Feeling dizzy/lightheaded [] Low blood pressure [] Poor or fluctuating kidney function [] High potassium [] Patient has experienced other side effects to this medication  before [] Patient will be unable to adhere/monitor [] Patient unable to afford it [] Patient does not want to      take this medication [] Pregnancy [] Other (specify: ) [] Unknown Reason Date discontinued:        / /  MM DD YYYY Reason (check all that apply): [] Swelling around lips, eyes       or face [] Feeling dizzy/lightheaded [] Low blood pressure [] Poor or fluctuating kidney function [] High potassium [] Other medication side       effects [] Patient unable to        adhere/monitor [] Had an       operation/procedure that        required stopping it [] Patient unable to afford it [] Patient no longer wants       to take this medication [] Pregnancy [] Other (specify: ) [] Unknown Reason   If started or changed  Medication Name: [] Benazepril (Lotensin) [] Captopril (Capoten) [] Enalapril (Vasotec) [] Fosinopril (Monopril) [] Lisinopril (Zestril, Prinivil) [] Quinapril (Accupril) [] Ramipril (Altace) [] Azilsartan  (Edarbi) [] Candesartan (Atacand) [] Irbesartan (Avapro) [] Losartan (Cozaar) [] Olmesartan (Benicar) [] Telmisartan (Micardis) [x] Valsartan (Diovan) [] Sacrubitril/Valsartan (Entresto)     Beta Blocker [] No [x]  Yes > [] Acebutolol (Sectral) [] Bisoprolol (Zebeta) [x] Carvedilol (Coreg) [] Labetalol (Trandate,      Normodyne) [] Metoprolol succinate (Toprol) [] Metoprolol tartrate       (Lopressor) [] Nadolol (Corgard) [] Nebivolol (Bystolic) [] Propranolol (Inderal) [] Sotalol (Betapace)     Medication Currently Prescribed? If started since last visit: If not started since last visit: If stopped since last visit:  Aldosterone Antagonist [x] No [] Yes > [] Amiloride [] Eplerenone (Inspra) [] Spirinolactone (Aldactone) [] Traimterene (Dyrenium)   Calcium Channel Blocker [x] No []  Yes > Medication Name: [] Amlodipine (Norvasc) [] Diltiazem (Cardizem) [] Felodipine (Plendil) [] Nifedipine (Procardia) [] Verapamil (Calan)   Diuretic Loop [x] No []  Yes > Medication Name: [] Bumetanide (Bumex) [] Ethacrynic acid (Edecrin) [] Furosemide (Lasix) [] Torsemide (Demadex)   Diuretic Thiazide- type [x] No []  Yes > Medication Name: [] Chlorothiazide      [] Chlorthalidone [] Hydrochlorothiazide [] Indapamide [] Metolazone   Anticoagulation Therapy (other than Warfarin) [x] No []  Yes > Medication Name: [] Apixaban (Eliquis) [] Edoxaban (Lixiana) [] Rivaroxaban Alen Blew) [] Dabigatran (Redaxa)   Warfarin [x] No []  Yes    Antiplatelet Agent (including aspirin) [] No [x]  Yes > Medication Name (check all that apply): [x] Aspirin [] Clopidogrel (Plavix) [] Prasugrel (Effient) [x] Ticagrelor (Brilinta) [] Ticlopidine (Ticlid) [] Dipyridamole (Persantine)    Medication Currently Prescribed? If started since last visit: If not started since last visit: If stopped since last visit:  Statin  [x] No >  [] Yes > Since last visit, medication was: [] Stopped [x] Not started [] Started [] Continued same  medication and dose [] Continued with dose or medication changes Date started:        / /             MM DD YYYY Who prescribed? [] Cardiology provider [] Endocrinology provider [] Primary care provider [] Other provider  Specify:                             [] Unknown Reason (check all that apply): [] History of Rhabdomyolysis [] LDL-cholesterol already       <70 [] Muscle      aches/pain/weakness [] Mental       fogginess/memory loss [] Liver dysfunction [x] Patient has  experienced other side   effects to this medication before [] Patient will be unable to adhere/monitor [] Patient unable to afford it [] Patient does not want to take this medication [] Pregnancy [] Other (specify: ) [] Unknown Reason Date discontinued:        / /             MM DD YYYY Reason (check all that apply): [] Rhabdomyolysis [] Muscle aches/pain/weakness [] Mental fogginess/memory loss [] Liver dysfunction [] Other medication side effects [] Patient unable to          adhere/monitor [] Patient unable to afford it [] Patient no longer wants to take  this medication [] Pregnancy [] Other (specify: ) [] Unknown Reason   If started or changed  Medication Name: [] Atorvastatin (Lipitor) [] Fluvastatin (Lescol) [] Lovastatin (Mevacor) [] Pravastatin (Pravachol) [] Rosuvastatin (Crestor) [] Simvastatin (Zocor) [] Pitatavastatin (Livalo)  Dose: [] 1 mg [] 10 mg [] 2 mg []  20 mg [] 3 mg []  40 mg [] 4 mg []  60 mg [] 5 mg []  80 mg  Frequency: [] Daily  [] Less than daily      Does the patient have statin intolerance that prevents the use of maximum dose of high potency statin? [] No [x] Yes >IF YES, Complete Statin Intolerance form   Non-statin lipid lowering therapy [] No [x] Yes > Medication Name (check all that apply): [] Colesevelam (Welchol) [] Ezetimibe (Zetia) [] Fibrate [] Niacin [x] PCSK9 inhibitor [] Omega 3 acid ethyl esters (Lovaza) [] Icosapent Ethyl (Vascepa) [] Over the counter omega 3 fatty acid or fish  oil supplement     Medication Currently Prescribed? If started since last visit: If not started since last visit: If stopped since last visit:  Diabetes Medications  SGLT2 Inhibitor  [x] No >  [] Yes > Since last visit, medication was: [] Stopped [x] Not started [] Started [] Continued same medication [] Continued with medication changes Date started:        / /             MM DD YYYY Who prescribed? [] Cardiology provider  [] Study clinic [] Outside clinic [] Endocrinology      provider [] Primary care provider [] Other provider  Specify:                             [] Unknown Reason (check all that apply): [] eGFR <45 [] HbA1c<7% on metformin monotherapy OR already on GLP1RA and do not need to start another anti- hyperglycemic [] Already dehydrated [] Low blood pressure [] High risk of Hypoglycemia [] Prior DKA [] Recurrent mycotic genital infections [] History of or at risk for amputation [] Patient has experienced other side effects to this medication before [] Patient will be unable to adhere/monitor [] Patient unable to afford it [] Patient does not want to take this medication [] Pregnancy [] Other (specify: ) [x] Unknown Reason Date discontinued:        / /             MM DD YYYY Reason (check all that apply  [] eGFR now <45 [] Dehydration [] Low blood pressure [] Hypoglycemia [] DKA [] Mycotic genital infection [] Amputation [] Other medication side effects [] Patient unable    to adhere/monitor  [] Had an operation/procedure     that required stopping it [] Patient unable to afford it [] Patient no longer wants to      take this medication [] Pregnancy [] Other (specify: ) [] Unknown Reason   If started or changed  Medication Name: [] Canaglifozin (Invokana) [] Dapagliflozin (Farxiga) [] Empaglifozin (Jardiance) [] Ertugliflozin Actuary)           Medication Currently Prescribed? If started since last visit: If not started since last visit: If stopped since last visit:   GLP1 Receptor Agonist  [x] No >  [] Yes > Since last visit, medication was: [] Stopped [x] Not started [] Started [] Continued same medication [] Continued with medication changes Date started:        / /             MM DD YYYY Who prescribed? [] Cardiology provider  [] Study clinic [] Outside clinic [] Endocrinology       provider [] Primary care provider [] Other provider > Specify:                             [] Unknown Reason (check all that apply): [] Personal or family  history of medullary thyroid cancer [] MEN2 [] HbA1c<7% on metformin monotherapy OR already on SGLT2i and do not need to start another anti-hyperglycemic [] eGFR now <30 [] High risk of Hypoglycemia [] History of pancreatitis [] Significant gastroparesis [] Prior gastric surgery [] Patient has experienced other side effects to this medication before [] Patient will be unable to adhere/monitor [] Patient unable to afford it [] Patient does not want to take this medication [] Pregnancy [] Other (specify: ) [x] Unknown Reason Date discontinued:        / /             MM DD YYYY Reason (check all that apply): [] Medullary thyroid cancer [] MEN2 [] eGFR now <30 [] Hypoglycemia [] Pancreatitis [] Significant gastroparesis [] Gastric surgery [] Other medication side       effects []   Patient  unable    to adhere/monitor                                  [] Had an operation/procedure that required stopping it [] Patient unable to afford it [] Patient no longer wants to take this medication [] Pregnancy [] Other (specify: ) [] Unknown Reason   If started or changed > Medication Name: [] Albiglutide (Tanzeum) [] Dulaglutide (Trulicity) [] Exanatide (Byetta, Bydureon) [] Liraglutide (Victoza, Saxenda) [] Lixisenatide (Adlyxin) [] Semaglutice (Ozempic)      Medication Currently Prescribed? If started since last visit: If not started since last visit: If stopped since last visit:  Other non Insulin diabetes medications [] No [x] Yes  > Medication Name (check all that apply): [] Acarbose (Precose) [] Miglitol (Glyset) [x] Glimepiride (Amaryl) [] Glipizide (Amaryl) [] Glyburide (Diabeta,       Glynase,   Micronase) [x] Metformin (Fortamet,        Glucophage[including XR],        Glumetza, Riomet) [] Pioglitazone (Actos) [] Nateglinide (Starlix) [] Pramlintide (Symilin) [] Repaglinide (Prandin) [] Rosiglitazone (Avandia) [] Alogliptin (Nesina) [] Linagliptin (Tradjenta) [] Saxagliptin (Onglyza) [] Sitagliptin (Januvia) [] Bromocriptine Quick Release (Cycloset)     Insulin [x] No []  Yes > total daily dose: units     STATIN INTOLERANCE (PER EHR/OTHER SOURCE DATA)  1. Was CK checked? [x] No [] Yes   >If yes, select from the following: [] CK not elevated [] CK elevated 1-5x upper limit of normal [] CK elevated >5x upper limit of normal  2. Does the patient have muscle symptoms? [] No [x] Yes    >If yes, select from the following: Location and pattern of muscle symptoms (select all that apply) [x] Symmetric, hip flexors or thighs [] Symmetric, calves [] Symmetric, proximal upper extremity [] Asymmetric, intermittent, or not specific to any area [] Unknown   Timing of muscle symptom in relation to starting statin regimen [] <4 weeks [] 4-12 weeks [] >12 weeks [x] Unknown   Timing of muscle symptoms improvement after withdrawal of statin [] <2 weeks [] 2-4 weeks [] No improvement after 4 weeks [x] Unknown  3. Was patient re-challenged with a statin regimen (even if same statin compound or regimen as above)?  [] No  [] Yes  [x] Unknown  >If yes, select from the following: Timing of recurrence of similar muscle symptoms in relation to starting second regimen [] <4 weeks [] 4-12 weeks [] >12 weeks [] Similar symptoms did not recur [x] Unknown   [email or web address]

## 2019-11-15 ENCOUNTER — Encounter (HOSPITAL_COMMUNITY): Payer: Self-pay

## 2019-11-15 ENCOUNTER — Other Ambulatory Visit: Payer: Self-pay

## 2019-11-15 ENCOUNTER — Encounter (HOSPITAL_COMMUNITY)
Admission: RE | Admit: 2019-11-15 | Discharge: 2019-11-15 | Disposition: A | Discharge status: Home / Self Care | Payer: Self-pay | Source: Ambulatory Visit | Attending: Cardiology | Admitting: Cardiology

## 2019-11-15 NOTE — Progress Notes (Signed)
Virtual Cardiac Rehab Nutrition Note  Spoke with pt over the phone. Discussed his nutrition goals. His fasting CBGs still running around 130 mg/dl. He has not utilized carb counting. He does report trying to stick to the plate method. Encouraged him to start checking post prandial CBGs as his recent triglycerides were elevated and his A1C is 7.4%.  Reviewed refined carbohydrates. He still eats white bread but did not identify other refined carbs in his diet.  Pt had no questions at this time.  Will continue to monitor pt during his time in virtual cardiac rehab with monthly phone calls.  Michaele Offer, MS, RDN, LDN

## 2019-11-18 DIAGNOSIS — Z1211 Encounter for screening for malignant neoplasm of colon: Secondary | ICD-10-CM | POA: Diagnosis not present

## 2019-11-19 ENCOUNTER — Encounter: Payer: Self-pay | Admitting: *Deleted

## 2019-11-19 DIAGNOSIS — N401 Enlarged prostate with lower urinary tract symptoms: Secondary | ICD-10-CM | POA: Diagnosis not present

## 2019-11-19 DIAGNOSIS — R972 Elevated prostate specific antigen [PSA]: Secondary | ICD-10-CM | POA: Diagnosis not present

## 2019-11-19 DIAGNOSIS — R3912 Poor urinary stream: Secondary | ICD-10-CM | POA: Diagnosis not present

## 2019-11-19 DIAGNOSIS — R351 Nocturia: Secondary | ICD-10-CM | POA: Diagnosis not present

## 2019-11-19 DIAGNOSIS — N4232 Atypical small acinar proliferation of prostate: Secondary | ICD-10-CM | POA: Diagnosis not present

## 2019-11-26 DIAGNOSIS — E538 Deficiency of other specified B group vitamins: Secondary | ICD-10-CM | POA: Diagnosis not present

## 2019-12-04 ENCOUNTER — Encounter (HOSPITAL_COMMUNITY)
Admission: RE | Admit: 2019-12-04 | Discharge: 2019-12-04 | Disposition: A | Discharge status: Home / Self Care | Payer: Self-pay | Source: Ambulatory Visit | Attending: Cardiology | Admitting: Cardiology

## 2019-12-04 ENCOUNTER — Other Ambulatory Visit: Payer: Self-pay

## 2019-12-04 DIAGNOSIS — G4733 Obstructive sleep apnea (adult) (pediatric): Secondary | ICD-10-CM | POA: Diagnosis not present

## 2019-12-04 DIAGNOSIS — Z955 Presence of coronary angioplasty implant and graft: Secondary | ICD-10-CM

## 2019-12-04 NOTE — Progress Notes (Signed)
Virtual Cardiac Rehab Note:  Successful telephone encounter to [employer]. [place]je[place] to follow up on barriers to VCR [employer] app utilization. Per Eric Ball, he does not enjoy logging his exercise and he is not checking his vitals often. He continues to walk his dog daily for exercise, approximately 30 min. He has been suffering with seasonal allergies which has limited his ability to be outdoors. Reports BP< 130/80 with CBGs 138-140s. Eric Ball is not interested in continuing a relationship with RN Case Manager or EP. Denies need for additional support or CAD education.  Plan: Will discharge Eric Ball from the [employer] per his request  Eric Greiner E. Rollene Rotunda RN, BSN [place]. [facility]  Cardiac and Pulmonary Rehabilitation Phone: [phone] Fax: [phone]

## 2019-12-12 DIAGNOSIS — R69 Illness, unspecified: Secondary | ICD-10-CM | POA: Diagnosis not present

## 2020-01-04 DIAGNOSIS — G4733 Obstructive sleep apnea (adult) (pediatric): Secondary | ICD-10-CM | POA: Diagnosis not present

## 2020-01-07 DIAGNOSIS — T466X5A Adverse effect of antihyperlipidemic and antiarteriosclerotic drugs, initial encounter: Secondary | ICD-10-CM | POA: Insufficient documentation

## 2020-01-07 DIAGNOSIS — G720 Drug-induced myopathy: Secondary | ICD-10-CM | POA: Insufficient documentation

## 2020-01-15 DIAGNOSIS — G4733 Obstructive sleep apnea (adult) (pediatric): Secondary | ICD-10-CM | POA: Diagnosis not present

## 2020-01-15 DIAGNOSIS — E538 Deficiency of other specified B group vitamins: Secondary | ICD-10-CM | POA: Diagnosis not present

## 2020-01-23 DIAGNOSIS — L812 Freckles: Secondary | ICD-10-CM | POA: Diagnosis not present

## 2020-01-23 DIAGNOSIS — D225 Melanocytic nevi of trunk: Secondary | ICD-10-CM | POA: Diagnosis not present

## 2020-01-23 DIAGNOSIS — L821 Other seborrheic keratosis: Secondary | ICD-10-CM | POA: Diagnosis not present

## 2020-01-23 DIAGNOSIS — L218 Other seborrheic dermatitis: Secondary | ICD-10-CM | POA: Diagnosis not present

## 2020-01-23 DIAGNOSIS — D1801 Hemangioma of skin and subcutaneous tissue: Secondary | ICD-10-CM | POA: Diagnosis not present

## 2020-01-23 DIAGNOSIS — L820 Inflamed seborrheic keratosis: Secondary | ICD-10-CM | POA: Diagnosis not present

## 2020-01-26 ENCOUNTER — Other Ambulatory Visit: Payer: Self-pay | Admitting: Cardiology

## 2020-02-03 DIAGNOSIS — G4733 Obstructive sleep apnea (adult) (pediatric): Secondary | ICD-10-CM | POA: Diagnosis not present

## 2020-02-04 DIAGNOSIS — H0011 Chalazion right upper eyelid: Secondary | ICD-10-CM | POA: Diagnosis not present

## 2020-02-12 DIAGNOSIS — H0011 Chalazion right upper eyelid: Secondary | ICD-10-CM | POA: Diagnosis not present

## 2020-02-14 DIAGNOSIS — G4733 Obstructive sleep apnea (adult) (pediatric): Secondary | ICD-10-CM | POA: Diagnosis not present

## 2020-03-05 DIAGNOSIS — G4733 Obstructive sleep apnea (adult) (pediatric): Secondary | ICD-10-CM | POA: Diagnosis not present

## 2020-03-16 DIAGNOSIS — G4733 Obstructive sleep apnea (adult) (pediatric): Secondary | ICD-10-CM | POA: Diagnosis not present

## 2020-03-17 DIAGNOSIS — E538 Deficiency of other specified B group vitamins: Secondary | ICD-10-CM | POA: Diagnosis not present

## 2020-04-05 DIAGNOSIS — G4733 Obstructive sleep apnea (adult) (pediatric): Secondary | ICD-10-CM | POA: Diagnosis not present

## 2020-04-07 DIAGNOSIS — R201 Hypoesthesia of skin: Secondary | ICD-10-CM | POA: Diagnosis not present

## 2020-04-07 DIAGNOSIS — E1140 Type 2 diabetes mellitus with diabetic neuropathy, unspecified: Secondary | ICD-10-CM | POA: Diagnosis not present

## 2020-04-07 DIAGNOSIS — Z6824 Body mass index (BMI) 24.0-24.9, adult: Secondary | ICD-10-CM | POA: Diagnosis not present

## 2020-04-07 DIAGNOSIS — E7849 Other hyperlipidemia: Secondary | ICD-10-CM | POA: Diagnosis not present

## 2020-04-07 DIAGNOSIS — I10 Essential (primary) hypertension: Secondary | ICD-10-CM | POA: Diagnosis not present

## 2020-04-14 DIAGNOSIS — G4733 Obstructive sleep apnea (adult) (pediatric): Secondary | ICD-10-CM | POA: Diagnosis not present

## 2020-04-22 DIAGNOSIS — R69 Illness, unspecified: Secondary | ICD-10-CM | POA: Diagnosis not present

## 2020-04-27 NOTE — Progress Notes (Signed)
HPI: FU CAD; he has had PCI of his circumflex and right coronary artery. Echocardiogram December 2018 showed normal LV systolic function. Patient admitted with non-ST elevation myocardial infarction February 2021.  He was found to have severe proximal LAD stenosis and was treated with drug-eluting stent.  Previous stents in the circumflex and right coronary artery were patent.  Ejection fraction 55%.  Since last seen,patient is doing well.  He denies chest pain, dyspnea, palpitations or syncope.  Current Outpatient Medications  Medication Sig Dispense Refill  . Alirocumab (PRALUENT) 150 MG/ML SOAJ Inject 1 pen into the skin every 14 (fourteen) days. 6 pen 3  . aspirin EC 81 MG EC tablet Take 1 tablet (81 mg total) by mouth daily. 90 tablet 3  . azelastine (ASTELIN) 0.1 % nasal spray Place 2 sprays into both nostrils 2 (two) times daily.    . carvedilol (COREG) 12.5 MG tablet TAKE 1 TABLET BY MOUTH TWICE A DAY WITH A MEAL 180 tablet 1  . glimepiride (AMARYL) 1 MG tablet Take 2 tablets by mouth daily as needed (blood glucose levels).     . metFORMIN (GLUCOPHAGE) 1000 MG tablet Take 1 tablet (1,000 mg total) by mouth 2 (two) times daily with a meal. Resume metformin on 09/12/19. (Patient taking differently: Take 1,000 mg by mouth 2 (two) times daily with a meal. Half tablet Twice Daily) 180 tablet 3  . nitroGLYCERIN (NITROSTAT) 0.4 MG SL tablet Place 1 tablet (0.4 mg total) under the tongue every 5 (five) minutes as needed for chest pain. 25 tablet 3  . omega-3 acid ethyl esters (LOVAZA) 1 g capsule TAKE 2 CAPSULES BY MOUTH TWICE A DAY (Patient taking differently: Take 2 g by mouth 2 (two) times daily. ) 360 capsule 0  . pantoprazole (PROTONIX) 40 MG tablet Take 1 tablet (40 mg total) by mouth daily. 90 tablet 3  . tamsulosin (FLOMAX) 0.4 MG CAPS capsule Take 0.4 mg by mouth daily.    . ticagrelor (BRILINTA) 90 MG TABS tablet Take 1 tablet (90 mg total) by mouth 2 (two) times daily. 180 tablet  3  . valsartan (DIOVAN) 160 MG tablet TAKE 1 TABLET BY MOUTH EVERY DAY (Patient not taking: Reported on 05/07/2020) 90 tablet 3   No current facility-administered medications for this visit.     Past Medical History:  Diagnosis Date  . Complication of anesthesia    "I'm slow to come out"  . Coronary artery disease 2015   PTCA with DES to Cx; PCI  BMS 2004; LHC 08/2019 DES to LAD  . GERD (gastroesophageal reflux disease)   . H/O hiatal hernia   . History of myocardial infarction    with a stent placed at that time  . Hypercholesterolemia    with intolerant ot many of the cholesterol medicaitons  . Hypertension    essentai hypertension  . Ischemic heart disease   . Myocardial infarction [facility]) 2004   with a stent placed at that time  . Nephrosis  1960's  . Sleep apnea    "suppose to wear mask; I don't wear it enough" (11/15/2013)  . Type II diabetes mellitus ([place])     Past Surgical History:  Procedure Laterality Date  . BACK SURGERY    . COLONOSCOPY  10/07/2011   [name]:multiple colonic polyps removed/colonic diverticulosis, tubular adenoma  . COLONOSCOPY N/A 12/08/2014   Procedure: COLONOSCOPY;  Surgeon: [name], MD;  Location[facility];  Service: Endoscopy;  Laterality: N/A;  [place] WITH STENT PLACEMENT  09/25/2002   EF-approximately 45% / stent placement to the mid arteriovenous circumflex with reduction of 99% narrowing to 0% with placement of a 3.0 x 16 mm Express II stent with improvement of TIMI grade 2 to TIMI grade 3 flow.  . CORONARY ANGIOPLASTY WITH STENT PLACEMENT  11/15/2013   "1"  . CORONARY STENT INTERVENTION N/A 07/03/2017   Procedure: CORONARY STENT INTERVENTION;  Surgeon: Nelva Bush, MD;  Location: [place] CV LAB;  Service: Cardiovascular;  Laterality: N/A;  . CORONARY STENT INTERVENTION N/A 09/09/2019   Procedure: CORONARY STENT INTERVENTION;  Surgeon: Sherren Mocha, MD;  Location: [place] CV LAB;  Service:  Cardiovascular;  Laterality: N/A;  . LEFT HEART CATH AND CORONARY ANGIOGRAPHY N/A 09/09/2019   Procedure: LEFT HEART CATH AND CORONARY ANGIOGRAPHY;  Surgeon: Sherren Mocha, MD;  Location: [place] [facility] LAB;  Service: Cardiovascular;  Laterality: N/A;  . LEFT HEART CATHETERIZATION WITH CORONARY ANGIOGRAM N/A 11/15/2013   Procedure: LEFT HEART CATHETERIZATION WITH CORONARY ANGIOGRAM;  Surgeon: Burnell Blanks, MD;  Location: [facility] CATH LAB;  Service: Cardiovascular;  Laterality: N/A;  . LUMBAR [place]  . NASAL SEPTUM SURGERY  02/20/2001   Septal reconstruction and turbinate reduction  . RIGHT/LEFT HEART CATH AND CORONARY ANGIOGRAPHY N/A 07/03/2017   Procedure: RIGHT/LEFT HEART CATH AND CORONARY ANGIOGRAPHY;  Surgeon: Nelva Bush, MD;  Location: [place] CV LAB;  Service: Cardiovascular;  Laterality: N/A;    Social History   Socioeconomic History  . Marital status: Married    Spouse name: Not on file  . Number of children: Not on file  . Years of education: Not on file  . Highest education level: Not on file  Occupational History  . Not on file  Tobacco Use  . Smoking status: Never Smoker  . Smokeless tobacco: Never Used  . Tobacco comment: Never smoker  Vaping Use  . Vaping Use: Never used  Substance and Sexual Activity  . Alcohol use: No    Alcohol/week: 0.0 standard drinks  . Drug use: No  . Sexual activity: Yes  Other Topics Concern  . Not on file  Social History Narrative  . Not on file   Social Determinants of Health   Financial Resource Strain:   . Difficulty of Paying Living Expenses: Not on file  Food Insecurity:   . Worried About Charity fundraiser in the Last Year: Not on file  . Ran Out of Food in the Last Year: Not on file  Transportation Needs:   . Lack of Transportation (Medical): Not on file  . Lack of Transportation (Non-Medical): Not on file  Physical Activity:   . Days of Exercise per Week: Not on file  . Minutes of Exercise  per Session: Not on file  Stress:   . Feeling of Stress : Not on file  Social Connections:   . Frequency of Communication with Friends and Family: Not on file  . Frequency of Social Gatherings with Friends and Family: Not on file  . Attends Religious Services: Not on file  . Active Member of Clubs or Organizations: Not on file  . Attends Archivist Meetings: Not on file  . Marital Status: Not on file  Intimate Partner Violence:   . Fear of Current or Ex-Partner: Not on file  . Emotionally Abused: Not on file  . Physically Abused: Not on file  . Sexually Abused: Not on file    Family  History  Problem Relation Age of Onset  . Coronary artery disease Father        with multiple angioplasties  . Healthy Sister   . Hypertension Other        runs in the family  . Colon cancer Neg Hx     ROS: no fevers or chills, productive cough, hemoptysis, dysphasia, odynophagia, melena, hematochezia, dysuria, hematuria, rash, seizure activity, orthopnea, PND, pedal edema, claudication. Remaining systems are negative.  Physical Exam: Well-developed well-nourished in no acute distress.  Skin is warm and dry.  HEENT is normal.  Neck is supple.  Chest is clear to auscultation with normal expansion.  Cardiovascular exam is regular rate and rhythm.  Abdominal exam nontender or distended. No masses palpated. Extremities show no edema. neuro grossly intact   A/P  1 coronary artery disease status post PCI-continue aspirin, Brilinta.  We will continue dual antiplatelet therapy for 1 full year following PCI and then discontinue Brilinta.  Patient is intolerant to statins.  2 hyperlipidemia-patient is intolerant to statins.  We will continue Praluent.  Check lipids and liver.  3 hypertension-blood pressure mildly elevated but he thinks he likely did not take his carvedilol this morning.  We will continue at present dose and continue valsartan.  Follow blood pressure and adjust regimen as  needed.  [name], MD

## 2020-05-05 DIAGNOSIS — G4733 Obstructive sleep apnea (adult) (pediatric): Secondary | ICD-10-CM | POA: Diagnosis not present

## 2020-05-06 DIAGNOSIS — D510 Vitamin B12 deficiency anemia due to intrinsic factor deficiency: Secondary | ICD-10-CM | POA: Diagnosis not present

## 2020-05-07 ENCOUNTER — Encounter: Payer: Self-pay | Admitting: Cardiology

## 2020-05-07 ENCOUNTER — Ambulatory Visit: Payer: Medicare HMO | Admitting: Cardiology

## 2020-05-07 ENCOUNTER — Other Ambulatory Visit: Payer: Self-pay

## 2020-05-07 VITALS — BP 138/80 | HR 75 | Ht 69.5 in | Wt 171.0 lb

## 2020-05-07 DIAGNOSIS — I10 Essential (primary) hypertension: Secondary | ICD-10-CM

## 2020-05-07 DIAGNOSIS — I2510 Atherosclerotic heart disease of native coronary artery without angina pectoris: Secondary | ICD-10-CM | POA: Diagnosis not present

## 2020-05-07 DIAGNOSIS — E7800 Pure hypercholesterolemia, unspecified: Secondary | ICD-10-CM | POA: Diagnosis not present

## 2020-05-07 LAB — HEPATIC FUNCTION PANEL
ALT: 80 IU/L — ABNORMAL HIGH (ref 0–44)
AST: 47 IU/L — ABNORMAL HIGH (ref 0–40)
Albumin: 5 g/dL — ABNORMAL HIGH (ref 3.8–4.8)
Alkaline Phosphatase: 47 IU/L (ref 44–121)
Bilirubin Total: 0.4 mg/dL (ref 0.0–1.2)
Bilirubin, Direct: 0.14 mg/dL (ref 0.00–0.40)
Total Protein: 7.5 g/dL (ref 6.0–8.5)

## 2020-05-07 LAB — LIPID PANEL
Chol/HDL Ratio: 3.4 ratio (ref 0.0–5.0)
Cholesterol, Total: 169 mg/dL (ref 100–199)
HDL: 49 mg/dL (ref >39)
LDL Chol Calc (NIH): 77 mg/dL (ref 0–99)
Triglycerides: 264 mg/dL — ABNORMAL HIGH (ref 0–149)
VLDL Cholesterol Cal: 43 mg/dL — ABNORMAL HIGH (ref 5–40)

## 2020-05-07 NOTE — Patient Instructions (Signed)

## 2020-05-14 DIAGNOSIS — G4733 Obstructive sleep apnea (adult) (pediatric): Secondary | ICD-10-CM | POA: Diagnosis not present

## 2020-05-14 DIAGNOSIS — R972 Elevated prostate specific antigen [PSA]: Secondary | ICD-10-CM | POA: Diagnosis not present

## 2020-05-18 DIAGNOSIS — R351 Nocturia: Secondary | ICD-10-CM | POA: Diagnosis not present

## 2020-05-18 DIAGNOSIS — N401 Enlarged prostate with lower urinary tract symptoms: Secondary | ICD-10-CM | POA: Diagnosis not present

## 2020-05-18 DIAGNOSIS — R972 Elevated prostate specific antigen [PSA]: Secondary | ICD-10-CM | POA: Diagnosis not present

## 2020-05-22 ENCOUNTER — Other Ambulatory Visit: Payer: Self-pay | Admitting: Urology

## 2020-05-22 ENCOUNTER — Telehealth: Payer: Self-pay | Admitting: Cardiology

## 2020-05-22 ENCOUNTER — Other Ambulatory Visit (HOSPITAL_COMMUNITY): Payer: Self-pay | Admitting: Urology

## 2020-05-22 DIAGNOSIS — R69 Illness, unspecified: Secondary | ICD-10-CM | POA: Diagnosis not present

## 2020-05-22 DIAGNOSIS — R972 Elevated prostate specific antigen [PSA]: Secondary | ICD-10-CM

## 2020-05-22 NOTE — Telephone Encounter (Signed)
   Primary Cardiologist: [name], MD  Chart reviewed as part of pre-operative protocol coverage. Patient was contacted 05/22/2020 in reference to pre-operative risk assessment for pending surgery as outlined below.  Eric Ball was last seen on 05/07/20 by Dr. Stanford Breed.  Since that day, Eric Ball has done fine from a cardiac standpoint. He can easily complete 4 METs without anginal complaints.  Therefore, based on ACC/AHA guidelines, the patient would be at acceptable risk for the planned procedure without further cardiovascular testing.   Per Dr. Stanford Breed, given recent PCI/DES to LAD 08/2019 and prior stents to LCx and RCA,  patient can hold brilinta 5 days prior to his upcoming surgery, however aspirin should be continued throughout the perioperative period. Brilinta to be restarted as soon as he is cleared to do so by his urologist  The patient was advised that if he develops new symptoms prior to surgery to contact our office to arrange for a follow-up visit, and he verbalized understanding.  I will route this recommendation to the requesting party via Epic fax function and remove from pre-op pool. Please call with questions.  Abigail Butts, PA-C 05/22/2020, 11:47 AM

## 2020-05-22 NOTE — Telephone Encounter (Signed)
Okay to hold Brilinta prior to biopsy for 5 days.  Resume after.  Patient must continue aspirin. [name]

## 2020-05-22 NOTE — Telephone Encounter (Signed)
   [facility] Pre-operative Risk Assessment    HEARTCARE STAFF: - Please ensure there is not already an duplicate clearance open for this procedure. - Under Visit Info/Reason for Call, type in Other and utilize the format Clearance MM/DD/YY or Clearance TBD. Do not use dashes or single digits. - If request is for dental extraction, please clarify the # of teeth to be extracted.  Request for surgical clearance:  1. What type of surgery is being performed? Prostate Ultrasound and Biopsy   2. When is this surgery scheduled? 06/04/20   3. What type of clearance is required (medical clearance vs. Pharmacy clearance to hold med vs. Both)? Both   4. Are there any medications that need to be held prior to surgery and how long? Brilinta & Aspirin 5 days   5. Practice name and name of physician performing surgery? [facility], Dr. Louis Meckel   6. What is the office phone number? [phone]   7.   What is the office fax number? [phone]  8.   Anesthesia type (None, local, MAC, general) ? General    Eric Ball 05/22/2020, 10:24 AM  _________________________________________________________________   (provider comments below)

## 2020-05-26 NOTE — Patient Instructions (Addendum)
DUE TO COVID-19 ONLY ONE VISITOR IS ALLOWED TO COME WITH YOU AND STAY IN THE WAITING ROOM ONLY DURING PRE OP AND PROCEDURE DAY OF SURGERY. THE 1 VISITOR  MAY VISIT WITH YOU AFTER SURGERY IN YOUR PRIVATE ROOM DURING VISITING HOURS ONLY!  YOU NEED TO HAVE A COVID 19 TEST ON: 06/01/20@ 8:20 AM , THIS TEST MUST BE DONE BEFORE SURGERY,  COVID TESTING SITE [address]  [id number], IT IS ON THE RIGHT GOING OUT [address] APPROXIMATELY  2 MINUTES PAST [employer] ON THE RIGHT. ONCE YOUR COVID TEST IS COMPLETED,  PLEASE BEGIN THE QUARANTINE INSTRUCTIONS AS OUTLINED IN YOUR HANDOUT.                Eric Ball    Your procedure is scheduled on: 06/04/20   Report to [facility] Main  Entrance   Report to short stay at: 5:30 AM     Call this number if you have problems the morning of surgery [phone]    Remember: Do not eat food or drink liquids :After Midnight.   BRUSH YOUR TEETH MORNING OF SURGERY AND RINSE YOUR MOUTH OUT, NO CHEWING GUM CANDY OR MINTS.    Take these medicines the morning of surgery with A SIP OF WATER: carvedilol,pantoprazole.Use nasal spray as usual.  How to Manage Your Diabetes Before and After Surgery  Why is it important to control my blood sugar before and after surgery? . Improving blood sugar levels before and after surgery helps healing and can limit problems. . A way of improving blood sugar control is eating a healthy diet by: o  Eating less sugar and carbohydrates o  Increasing activity/exercise o  Talking with your doctor about reaching your blood sugar goals . High blood sugars (greater than 180 mg/dL) can raise your risk of infections and slow your recovery, so you will need to focus on controlling your diabetes during the weeks before surgery. . Make sure that the doctor who takes care of your diabetes knows about your planned surgery including the date and location.  How do I manage my blood sugar before  surgery? . Check your blood sugar at least 4 times a day, starting 2 days before surgery, to make sure that the level is not too high or low. o Check your blood sugar the morning of your surgery when you wake up and every 2 hours until you get to the Short Stay unit. . If your blood sugar is less than 70 mg/dL, you will need to treat for low blood sugar: o Do not take insulin. o Treat a low blood sugar (less than 70 mg/dL) with  cup of clear juice (cranberry or apple), 4 glucose tablets, OR glucose gel. o Recheck blood sugar in 15 minutes after treatment (to make sure it is greater than 70 mg/dL). If your blood sugar is not greater than 70 mg/dL on recheck, call [phone] for further instructions. . Report your blood sugar to the short stay nurse when you get to Short Stay.  . If you are admitted to the hospital after surgery: o Your blood sugar will be checked by the staff and you will probably be given insulin after surgery (instead of oral diabetes medicines) to make sure you have good blood sugar levels. o The goal for blood sugar control after surgery is 80-180 mg/dL.   WHAT DO I DO ABOUT MY DIABETES MEDICATION?  Marland Kitchen Do not take oral diabetes medicines (pills) the  morning of surgery.  . THE DAY BEFORE SURGERY, take Amaryl and Metformin as usual.       . THE MORNING OF SURGERY:  DO NOT TAKE ANY DIABETIC MEDICATIONS DAY OF YOUR SURGERY                               You may not have any metal on your body including hair pins and              piercings  Do not wear jewelry, lotions, powders or perfumes, deodorant             Men may shave face and neck.   Do not bring valuables to the hospital. [place].  Contacts, dentures or bridgework may not be worn into surgery.  Leave suitcase in the car. After surgery it may be brought to your room.     Patients discharged the day of surgery will not be allowed to drive home. IF YOU ARE  HAVING SURGERY AND GOING HOME THE SAME DAY, YOU MUST HAVE AN ADULT TO DRIVE YOU HOME AND BE WITH YOU FOR 24 HOURS. YOU MAY GO HOME BY TAXI OR UBER OR ORTHERWISE, BUT AN ADULT MUST ACCOMPANY YOU HOME AND STAY WITH YOU FOR 24 HOURS.  Name and phone number of your driver:  Special Instructions: N/A              Please read over the following fact sheets you were given: _____________________________________________________________________          [facility] - Preparing for Surgery Before surgery, you can play an important role.  Because skin is not sterile, your skin needs to be as free of germs as possible.  You can reduce the number of germs on your skin by washing with CHG (chlorahexidine gluconate) soap before surgery.  CHG is an antiseptic cleaner which kills germs and bonds with the skin to continue killing germs even after washing. Please DO NOT use if you have an allergy to CHG or antibacterial soaps.  If your skin becomes reddened/irritated stop using the CHG and inform your nurse when you arrive at Short Stay. Do not shave (including legs and underarms) for at least 48 hours prior to the first CHG shower.  You may shave your face/neck. Please follow these instructions carefully:  1.  Shower with CHG Soap the night before surgery and the  morning of Surgery.  2.  If you choose to wash your hair, wash your hair first as usual with your  normal  shampoo.  3.  After you shampoo, rinse your hair and body thoroughly to remove the  shampoo.                           4.  Use CHG as you would any other liquid soap.  You can apply chg directly  to the skin and wash                       Gently with a scrungie or clean washcloth.  5.  Apply the CHG Soap to your body ONLY FROM THE NECK DOWN.   Do not use on face/ open  Wound or open sores. Avoid contact with eyes, ears mouth and genitals (private parts).                       Wash face,  Genitals (private parts) with your  normal soap.             6.  Wash thoroughly, paying special attention to the area where your surgery  will be performed.  7.  Thoroughly rinse your body with warm water from the neck down.  8.  DO NOT shower/wash with your normal soap after using and rinsing off  the CHG Soap.                9.  Pat yourself dry with a clean towel.            10.  Wear clean pajamas.            11.  Place clean sheets on your bed the night of your first shower and do not  sleep with pets. Day of Surgery : Do not apply any lotions/deodorants the morning of surgery.  Please wear clean clothes to the hospital/surgery center.  FAILURE TO FOLLOW THESE INSTRUCTIONS MAY RESULT IN THE CANCELLATION OF YOUR SURGERY PATIENT SIGNATURE_________________________________  NURSE SIGNATURE__________________________________  ________________________________________________________________________

## 2020-05-27 ENCOUNTER — Encounter (HOSPITAL_COMMUNITY)
Admission: RE | Admit: 2020-05-27 | Discharge: 2020-05-27 | Disposition: A | Discharge status: Home / Self Care | Payer: Medicare HMO | Source: Ambulatory Visit | Attending: Urology | Admitting: Urology

## 2020-05-27 ENCOUNTER — Encounter (HOSPITAL_COMMUNITY): Payer: Self-pay

## 2020-05-27 ENCOUNTER — Other Ambulatory Visit: Payer: Self-pay

## 2020-05-27 DIAGNOSIS — Z7901 Long term (current) use of anticoagulants: Secondary | ICD-10-CM | POA: Insufficient documentation

## 2020-05-27 DIAGNOSIS — Z7982 Long term (current) use of aspirin: Secondary | ICD-10-CM | POA: Diagnosis not present

## 2020-05-27 DIAGNOSIS — K219 Gastro-esophageal reflux disease without esophagitis: Secondary | ICD-10-CM | POA: Diagnosis not present

## 2020-05-27 DIAGNOSIS — Z01812 Encounter for preprocedural laboratory examination: Secondary | ICD-10-CM | POA: Insufficient documentation

## 2020-05-27 DIAGNOSIS — I10 Essential (primary) hypertension: Secondary | ICD-10-CM | POA: Diagnosis not present

## 2020-05-27 DIAGNOSIS — E118 Type 2 diabetes mellitus with unspecified complications: Secondary | ICD-10-CM | POA: Diagnosis not present

## 2020-05-27 DIAGNOSIS — Z79899 Other long term (current) drug therapy: Secondary | ICD-10-CM | POA: Diagnosis not present

## 2020-05-27 DIAGNOSIS — R972 Elevated prostate specific antigen [PSA]: Secondary | ICD-10-CM | POA: Diagnosis not present

## 2020-05-27 DIAGNOSIS — I2510 Atherosclerotic heart disease of native coronary artery without angina pectoris: Secondary | ICD-10-CM | POA: Insufficient documentation

## 2020-05-27 DIAGNOSIS — Z7984 Long term (current) use of oral hypoglycemic drugs: Secondary | ICD-10-CM | POA: Insufficient documentation

## 2020-05-27 LAB — BASIC METABOLIC PANEL
Anion gap: 11 (ref 5–15)
BUN: 25 mg/dL — ABNORMAL HIGH (ref 8–23)
CO2: 21 mmol/L — ABNORMAL LOW (ref 22–32)
Calcium: 9.2 mg/dL (ref 8.9–10.3)
Chloride: 104 mmol/L (ref 98–111)
Creatinine, Ser: 1.28 mg/dL — ABNORMAL HIGH (ref 0.61–1.24)
GFR, Estimated: >60 mL/min (ref >60)
Glucose, Bld: 182 mg/dL — ABNORMAL HIGH (ref 70–99)
Potassium: 4.5 mmol/L (ref 3.5–5.1)
Sodium: 136 mmol/L (ref 135–145)

## 2020-05-27 LAB — HEMOGLOBIN A1C
Hgb A1c MFr Bld: 7.2 % — ABNORMAL HIGH (ref 4.8–5.6)
Mean Plasma Glucose: 159.94 mg/dL

## 2020-05-27 LAB — CBC
HCT: 41.8 % (ref 39.0–52.0)
Hemoglobin: 13.8 g/dL (ref 13.0–17.0)
MCH: 30.2 pg (ref 26.0–34.0)
MCHC: 33 g/dL (ref 30.0–36.0)
MCV: 91.5 fL (ref 80.0–100.0)
Platelets: 235 10*3/uL (ref 150–400)
RBC: 4.57 MIL/uL (ref 4.22–5.81)
RDW: 12.6 % (ref 11.5–15.5)
WBC: 5.8 10*3/uL (ref 4.0–10.5)
nRBC: 0 % (ref 0.0–0.2)

## 2020-05-27 LAB — GLUCOSE, CAPILLARY: Glucose-Capillary: 177 mg/dL — ABNORMAL HIGH (ref 70–99)

## 2020-05-27 NOTE — Progress Notes (Addendum)
COVID Vaccine Completed: NO Date COVID Vaccine completed: COVID vaccine manufacturer: Pf[employer]    [employer]   PCP - Dr. Redmond School. Cardiologist - Dr. [name]. Clearance: 05/22/20: Daleen Snook Kroegar PAC. EPIC  Chest x-ray -  EKG - 09/19/19 Stress Test -  ECHO - 07/01/17 Cardiac Cath - 09/09/19 EPIC Pacemaker/ICD device last checked:  Sleep Study - Yes CPAP - Yes  Fasting Blood Sugar - 100's Checks Blood Sugar __2___ times a day  Blood Thinner Instructions: Brilinta will be hold 5 days before procedure as per Dr. Jacalyn Lefevre instructions. Aspirin Instructions: Last Dose:  Anesthesia review: Hx: HTN,CAD,MI,DIA,OSA(CPAP)  Patient denies shortness of breath, fever, cough and chest pain at PAT appointment   Patient verbalized understanding of instructions that were given to them at the PAT appointment. Patient was also instructed that they will need to review over the PAT instructions again at home before surgery.

## 2020-05-28 NOTE — Progress Notes (Signed)
Anesthesia Chart Review   Case: [id number] Date/Time: 06/04/20 0715   Procedure: SATURATION BIOPSY TRANSRECTAL ULTRASONIC PROSTATE (TUBP) (N/A )   Anesthesia type: Choice   Pre-op diagnosis: ELEVATED PSA   Location: [facility] / [facility]   Surgeons: Ardis Hughs, MD      DISCUSSION:69 y.o. never smoker with h/o HTN, GERD, sleep apnea, DM II, CAD (pci/DES 08/2019), elevated PSA scheduled for above procedure 06/04/20 with Dr. Louis Meckel.   Per cardiology preoperative risk assessment 05/22/2020, "Chart reviewed as part of pre-operative protocol coverage. Patient was contacted 05/22/2020 in reference to pre-operative risk assessment for pending surgery as outlined below.  Eric Ball was last seen on 05/07/20 by Dr. Stanford Breed.  Since that day, Eric Ball has done fine from a cardiac standpoint. He can easily complete 4 METs without anginal complaints. Therefore, based on ACC/AHA guidelines, the patient would be at acceptable risk for the planned procedure without further cardiovascular testing.  Per Dr. Stanford Breed, given recent PCI/DES to LAD 08/2019 and prior stents to LCx and RCA,  patient can hold brilinta 5 days prior to his upcoming surgery, however aspirin should be continued throughout the perioperative period. Brilinta to be restarted as soon as he is cleared to do so by his urologist."  Anticipate pt can proceed with planned procedure barring acute status change.   VS: BP (!) 142/83   Pulse 75   Temp 36.9 C (Oral)   Resp 18   Ht 5' 9.5" (1.765 m)   Wt 77.1 kg   SpO2 100%   BMI 24.74 kg/m   PROVIDERS: Redmond School, MD is PCP   [name], MD is Cardiologist  LABS: Labs reviewed: Acceptable for surgery. (all labs ordered are listed, but only abnormal results are displayed)  Labs Reviewed  GLUCOSE, CAPILLARY - Abnormal; Notable for the following components:      Result Value   Glucose-Capillary 177 (*)    All other components within normal limits   BASIC METABOLIC PANEL - Abnormal; Notable for the following components:   CO2 21 (*)    Glucose, Bld 182 (*)    BUN 25 (*)    Creatinine, Ser 1.28 (*)    All other components within normal limits  HEMOGLOBIN A1C - Abnormal; Notable for the following components:   Hgb A1c MFr Bld 7.2 (*)    All other components within normal limits  CBC     IMAGES:   EKG: 09/19/19  Rate 73 bpm  NSR Right axis deviation   CV: Cardiac Cath 09/09/2019 1. Severe de novo proximal LAD stenosis, treated successfully with a 2.5x15 mm Resolute Onyx DES, post-dilated with a 3.0 [place] balloon 2. Continued patency of the LCx and RCA stents 3. Low normal LV systolic function, LVEF estimated at 55%  Recommend: continue ASA and ticagrelor at least 12 months without interruption  Echo 07/01/2017 Study Conclusions   - Left ventricle: The cavity size was normal. Wall thickness was  normal. Systolic function was normal. The estimated ejection  fraction was in the range of 60% to 65%. Wall motion was normal;  there were no regional wall motion abnormalities. Left  ventricular diastolic function parameters were normal.  - Aortic valve: Valve area (VTI): 3.57 cm^2. Valve area (Vmax):  3.88 cm^2. Valve area (Vmean): 3.48 cm^2.  Past Medical History:  Diagnosis Date  . Complication of anesthesia    "I'm slow to come out"  . Coronary artery disease 2015   PTCA with DES  to Cx; PCI  BMS 2004; LHC 08/2019 DES to LAD  . GERD (gastroesophageal reflux disease)   . H/O hiatal hernia   . History of myocardial infarction    with a stent placed at that time  . Hypercholesterolemia    with intolerant ot many of the cholesterol medicaitons  . Hypertension    essentai hypertension  . Ischemic heart disease   . Myocardial infarction [facility]) 2004   with a stent placed at that time  . Nephrosis  1960's  . Sleep apnea    "suppose to wear mask; I don't wear it enough" (11/15/2013)  . Type II diabetes mellitus  ([place])     Past Surgical History:  Procedure Laterality Date  . BACK SURGERY    . COLONOSCOPY  10/07/2011   [name]:multiple colonic polyps removed/colonic diverticulosis, tubular adenoma  . COLONOSCOPY N/A 12/08/2014   Procedure: COLONOSCOPY;  Surgeon: [name], MD;  Location[facility];  Service: Endoscopy;  Laterality: N/A;  930  . CORONARY ANGIOPLASTY WITH STENT PLACEMENT  09/25/2002   EF-approximately 45% / stent placement to the mid arteriovenous circumflex with reduction of 99% narrowing to 0% with placement of a 3.0 x 16 mm Express II stent with improvement of TIMI grade 2 to TIMI grade 3 flow.  . CORONARY ANGIOPLASTY WITH STENT PLACEMENT  11/15/2013   "1"  . CORONARY STENT INTERVENTION N/A 07/03/2017   Procedure: CORONARY STENT INTERVENTION;  Surgeon: Nelva Bush, MD;  Location: [place] CV LAB;  Service: Cardiovascular;  Laterality: N/A;  . CORONARY STENT INTERVENTION N/A 09/09/2019   Procedure: CORONARY STENT INTERVENTION;  Surgeon: Sherren Mocha, MD;  Location: [place] CV LAB;  Service: Cardiovascular;  Laterality: N/A;  . EYE SURGERY    . LEFT HEART CATH AND CORONARY ANGIOGRAPHY N/A 09/09/2019   Procedure: LEFT HEART CATH AND CORONARY ANGIOGRAPHY;  Surgeon: Sherren Mocha, MD;  Location: [place] [facility] LAB;  Service: Cardiovascular;  Laterality: N/A;  . LEFT HEART CATHETERIZATION WITH CORONARY ANGIOGRAM N/A 11/15/2013   Procedure: LEFT HEART CATHETERIZATION WITH CORONARY ANGIOGRAM;  Surgeon: Burnell Blanks, MD;  Location: [facility] CATH LAB;  Service: Cardiovascular;  Laterality: N/A;  . LUMBAR [place]  . NASAL SEPTUM SURGERY  02/20/2001   Septal reconstruction and turbinate reduction  . RIGHT/LEFT HEART CATH AND CORONARY ANGIOGRAPHY N/A 07/03/2017   Procedure: RIGHT/LEFT HEART CATH AND CORONARY ANGIOGRAPHY;  Surgeon: Nelva Bush, MD;  Location: [place] CV LAB;  Service: Cardiovascular;  Laterality: N/A;    MEDICATIONS: . Alirocumab  (PRALUENT) 150 MG/ML SOAJ  . aspirin EC 81 MG EC tablet  . azelastine (ASTELIN) 0.1 % nasal spray  . carvedilol (COREG) 12.5 MG tablet  . Cholecalciferol (VITAMIN D3 PO)  . fluticasone (FLONASE) 50 MCG/ACT nasal spray  . glimepiride (AMARYL) 2 MG tablet  . ketoconazole (NIZORAL) 2 % cream  . loperamide (IMODIUM) 2 MG capsule  . metFORMIN (GLUCOPHAGE) 1000 MG tablet  . Multiple Vitamins-Minerals (ZINC PO)  . nitroGLYCERIN (NITROSTAT) 0.4 MG SL tablet  . omega-3 acid ethyl esters (LOVAZA) 1 g capsule  . Omega-3 Fatty Acids (FISH OIL) 1200 MG CAPS  . pantoprazole (PROTONIX) 40 MG tablet  . QUERCETIN PO  . silodosin (RAPAFLO) 8 MG CAPS capsule  . ticagrelor (BRILINTA) 90 MG TABS tablet  . valsartan (DIOVAN) 160 MG tablet   No current facility-administered medications for this encounter.    Konrad Felix, PA-C [facility] [phone]

## 2020-06-01 ENCOUNTER — Other Ambulatory Visit (HOSPITAL_COMMUNITY)
Admission: RE | Admit: 2020-06-01 | Discharge: 2020-06-01 | Disposition: A | Discharge status: Home / Self Care | Payer: Medicare HMO | Source: Ambulatory Visit | Attending: Urology | Admitting: Urology

## 2020-06-01 DIAGNOSIS — Z20822 Contact with and (suspected) exposure to covid-19: Secondary | ICD-10-CM | POA: Diagnosis not present

## 2020-06-01 DIAGNOSIS — Z01818 Encounter for other preprocedural examination: Secondary | ICD-10-CM | POA: Insufficient documentation

## 2020-06-01 LAB — SARS CORONAVIRUS 2 (TAT 6-24 HRS): SARS Coronavirus 2: NEGATIVE

## 2020-06-03 NOTE — Anesthesia Preprocedure Evaluation (Addendum)
Anesthesia Evaluation  Patient identified by MRN, date of birth, ID band Patient awake    Reviewed: Allergy & Precautions, NPO status , Patient's Chart, lab work & pertinent test results  History of Anesthesia Complications Negative for: history of anesthetic complications  Airway Mallampati: II  TM Distance: >3 FB Neck ROM: Full    Dental no notable dental hx. (+) Dental Advisory Given   Pulmonary sleep apnea ,    Pulmonary exam normal        Cardiovascular hypertension, Pt. on home beta blockers and Pt. on medications + CAD, + Past MI and + Cardiac Stents  Normal cardiovascular exam  Per cardiology preoperative risk assessment 05/22/2020, "Chart reviewed as part of pre-operative protocol coverage. Patient was contacted10/29/2021in reference to pre-operative risk assessment for pending surgery as outlined below. [place] last seen on 10/14/21by Dr. Stanford Breed. Since that day, NORBERTO WISHON done fine from a cardiac standpoint.He can easily complete 4 METs without anginal complaints. Therefore, based on ACC/AHA guidelines, the patient would be at acceptable risk for the planned procedure without further cardiovascular testing. Per Dr. Stanford Breed, given recent PCI/DES to LAD 08/2019 and prior stents to LCx and RCA, patient can hold brilinta 5 days prior to his upcoming surgery, however aspirin should be continued throughout the perioperative period. Brilinta to be restarted as soon as he is cleared to do so by his urologist."    Neuro/Psych negative neurological ROS     GI/Hepatic negative GI ROS, Neg liver ROS,   Endo/Other  diabetes  Renal/GU      Musculoskeletal negative musculoskeletal ROS (+)   Abdominal   Peds  Hematology negative hematology ROS (+)   Anesthesia Other Findings   Reproductive/Obstetrics                            Anesthesia Physical Anesthesia  Plan  ASA: III  Anesthesia Plan: MAC   Post-op Pain Management:    Induction:   PONV Risk Score and Plan: 4 or greater and Ondansetron and Dexamethasone  Airway Management Planned: Natural Airway and Simple Face Mask  Additional Equipment:   Intra-op Plan:   Post-operative Plan:   Informed Consent: I have reviewed the patients History and Physical, chart, labs and discussed the procedure including the risks, benefits and alternatives for the proposed anesthesia with the patient or authorized representative who has indicated his/her understanding and acceptance.     Dental advisory given  Plan Discussed with: Anesthesiologist and CRNA  Anesthesia Plan Comments:       Anesthesia Quick Evaluation

## 2020-06-04 ENCOUNTER — Ambulatory Visit (HOSPITAL_COMMUNITY): Payer: Medicare HMO | Admitting: Physician Assistant

## 2020-06-04 ENCOUNTER — Encounter (HOSPITAL_COMMUNITY): Payer: Self-pay | Admitting: Urology

## 2020-06-04 ENCOUNTER — Encounter (HOSPITAL_COMMUNITY)
Admission: RE | Disposition: A | Discharge status: Home / Self Care | Payer: Self-pay | Source: Home / Self Care | Attending: Urology

## 2020-06-04 ENCOUNTER — Ambulatory Visit (HOSPITAL_COMMUNITY): Payer: Medicare HMO | Admitting: Anesthesiology

## 2020-06-04 ENCOUNTER — Ambulatory Visit (HOSPITAL_COMMUNITY): Payer: Medicare HMO

## 2020-06-04 ENCOUNTER — Ambulatory Visit (HOSPITAL_COMMUNITY)
Admission: RE | Admit: 2020-06-04 | Discharge: 2020-06-04 | Disposition: A | Discharge status: Home / Self Care | Payer: Medicare HMO | Attending: Urology | Admitting: Urology

## 2020-06-04 ENCOUNTER — Ambulatory Visit (HOSPITAL_COMMUNITY)
Admission: RE | Admit: 2020-06-04 | Discharge: 2020-06-04 | Disposition: A | Discharge status: Home / Self Care | Payer: Medicare HMO | Source: Ambulatory Visit | Attending: Urology | Admitting: Urology

## 2020-06-04 DIAGNOSIS — Z7984 Long term (current) use of oral hypoglycemic drugs: Secondary | ICD-10-CM | POA: Diagnosis not present

## 2020-06-04 DIAGNOSIS — Z79899 Other long term (current) drug therapy: Secondary | ICD-10-CM | POA: Insufficient documentation

## 2020-06-04 DIAGNOSIS — R972 Elevated prostate specific antigen [PSA]: Secondary | ICD-10-CM

## 2020-06-04 DIAGNOSIS — R3912 Poor urinary stream: Secondary | ICD-10-CM | POA: Diagnosis not present

## 2020-06-04 DIAGNOSIS — Z7982 Long term (current) use of aspirin: Secondary | ICD-10-CM | POA: Diagnosis not present

## 2020-06-04 DIAGNOSIS — R351 Nocturia: Secondary | ICD-10-CM | POA: Insufficient documentation

## 2020-06-04 DIAGNOSIS — Z888 Allergy status to other drugs, medicaments and biological substances status: Secondary | ICD-10-CM | POA: Insufficient documentation

## 2020-06-04 DIAGNOSIS — R3911 Hesitancy of micturition: Secondary | ICD-10-CM | POA: Diagnosis not present

## 2020-06-04 DIAGNOSIS — N401 Enlarged prostate with lower urinary tract symptoms: Secondary | ICD-10-CM | POA: Diagnosis present

## 2020-06-04 DIAGNOSIS — E119 Type 2 diabetes mellitus without complications: Secondary | ICD-10-CM | POA: Diagnosis not present

## 2020-06-04 DIAGNOSIS — I10 Essential (primary) hypertension: Secondary | ICD-10-CM | POA: Diagnosis not present

## 2020-06-04 DIAGNOSIS — I25110 Atherosclerotic heart disease of native coronary artery with unstable angina pectoris: Secondary | ICD-10-CM | POA: Diagnosis not present

## 2020-06-04 HISTORY — PX: PROSTATE BIOPSY: SHX241

## 2020-06-04 LAB — GLUCOSE, CAPILLARY
Glucose-Capillary: 158 mg/dL — ABNORMAL HIGH (ref 70–99)
Glucose-Capillary: 156 mg/dL — ABNORMAL HIGH (ref 70–99)

## 2020-06-04 SURGERY — BIOPSY, PROSTATE, RECTAL APPROACH, WITH US GUIDANCE
Anesthesia: Monitor Anesthesia Care

## 2020-06-04 MED ORDER — CHLORHEXIDINE GLUCONATE 0.12 % MT SOLN
15.0000 mL | Freq: Once | OROMUCOSAL | Status: AC
  Administered 2020-06-04: 15 mL via OROMUCOSAL

## 2020-06-04 MED ORDER — ORAL CARE MOUTH RINSE: 15.0000 mL | Freq: Once | OROMUCOSAL | Status: AC

## 2020-06-04 MED ORDER — PROPOFOL 500 MG/50ML IV EMUL
INTRAVENOUS | Status: DC | PRN
  Administered 2020-06-04: 150 ug/kg/min via INTRAVENOUS

## 2020-06-04 MED ORDER — MIDAZOLAM HCL 5 MG/5ML IJ SOLN
INTRAMUSCULAR | Status: DC | PRN
  Administered 2020-06-04: 2 mg via INTRAVENOUS

## 2020-06-04 MED ORDER — PROPOFOL 10 MG/ML IV BOLUS
INTRAVENOUS | Status: AC
  Filled 2020-06-04: qty 20

## 2020-06-04 MED ORDER — PROPOFOL 500 MG/50ML IV EMUL
INTRAVENOUS | Status: AC
  Filled 2020-06-04: qty 50

## 2020-06-04 MED ORDER — SODIUM CHLORIDE 0.9 % IV SOLN
2.0000 g | INTRAVENOUS | Status: AC
  Administered 2020-06-04: 2 g via INTRAVENOUS
  Filled 2020-06-04: qty 20

## 2020-06-04 MED ORDER — ROCURONIUM BROMIDE 10 MG/ML (PF) SYRINGE
PREFILLED_SYRINGE | INTRAVENOUS | Status: AC
  Filled 2020-06-04: qty 10

## 2020-06-04 MED ORDER — PROMETHAZINE HCL 25 MG/ML IJ SOLN: 6.2500 mg | INTRAMUSCULAR | Status: DC | PRN

## 2020-06-04 MED ORDER — SUCCINYLCHOLINE CHLORIDE 200 MG/10ML IV SOSY
PREFILLED_SYRINGE | INTRAVENOUS | Status: AC
  Filled 2020-06-04: qty 10

## 2020-06-04 MED ORDER — PHENYLEPHRINE 40 MCG/ML (10ML) SYRINGE FOR IV PUSH (FOR BLOOD PRESSURE SUPPORT)
PREFILLED_SYRINGE | INTRAVENOUS | Status: DC | PRN
  Administered 2020-06-04: 160 ug via INTRAVENOUS
  Administered 2020-06-04: 120 ug via INTRAVENOUS

## 2020-06-04 MED ORDER — LIDOCAINE 2% (20 MG/ML) 5 ML SYRINGE
INTRAMUSCULAR | Status: AC
  Filled 2020-06-04: qty 5

## 2020-06-04 MED ORDER — FENTANYL CITRATE (PF) 100 MCG/2ML IJ SOLN
INTRAMUSCULAR | Status: AC
  Filled 2020-06-04: qty 2

## 2020-06-04 MED ORDER — LACTATED RINGERS IV SOLN: INTRAVENOUS | Status: DC

## 2020-06-04 MED ORDER — MIDAZOLAM HCL 2 MG/2ML IJ SOLN
INTRAMUSCULAR | Status: AC
  Filled 2020-06-04: qty 2

## 2020-06-04 MED ORDER — FENTANYL CITRATE (PF) 100 MCG/2ML IJ SOLN: 25.0000 ug | INTRAMUSCULAR | Status: DC | PRN

## 2020-06-04 MED ORDER — DEXAMETHASONE SODIUM PHOSPHATE 10 MG/ML IJ SOLN
INTRAMUSCULAR | Status: AC
  Filled 2020-06-04: qty 1

## 2020-06-04 MED ORDER — PHENYLEPHRINE 40 MCG/ML (10ML) SYRINGE FOR IV PUSH (FOR BLOOD PRESSURE SUPPORT)
PREFILLED_SYRINGE | INTRAVENOUS | Status: AC
  Filled 2020-06-04: qty 10

## 2020-06-04 MED ORDER — ONDANSETRON HCL 4 MG/2ML IJ SOLN
INTRAMUSCULAR | Status: DC | PRN
  Administered 2020-06-04: 4 mg via INTRAVENOUS

## 2020-06-04 MED ORDER — DEXAMETHASONE SODIUM PHOSPHATE 4 MG/ML IJ SOLN
INTRAMUSCULAR | Status: DC | PRN
  Administered 2020-06-04: 5 mg via INTRAVENOUS

## 2020-06-04 MED ORDER — ONDANSETRON HCL 4 MG/2ML IJ SOLN
INTRAMUSCULAR | Status: AC
  Filled 2020-06-04: qty 2

## 2020-06-04 MED ORDER — FENTANYL CITRATE (PF) 100 MCG/2ML IJ SOLN
INTRAMUSCULAR | Status: DC | PRN
  Administered 2020-06-04: 50 ug via INTRAVENOUS

## 2020-06-04 MED ORDER — TICAGRELOR 90 MG PO TABS
90.0000 mg | ORAL_TABLET | Freq: Two times a day (BID) | ORAL | 3 refills | Status: DC
Start: 2020-06-04 — End: 2020-10-28

## 2020-06-04 MED ORDER — EPHEDRINE SULFATE-NACL 50-0.9 MG/10ML-% IV SOSY
PREFILLED_SYRINGE | INTRAVENOUS | Status: DC | PRN
  Administered 2020-06-04 (×3): 10 mg via INTRAVENOUS
  Administered 2020-06-04 (×2): 5 mg via INTRAVENOUS

## 2020-06-04 MED ORDER — CELECOXIB 200 MG PO CAPS
200.0000 mg | ORAL_CAPSULE | Freq: Once | ORAL | Status: AC
  Administered 2020-06-04: 200 mg via ORAL
  Filled 2020-06-04: qty 1

## 2020-06-04 MED ORDER — ACETAMINOPHEN 500 MG PO TABS
1000.0000 mg | ORAL_TABLET | Freq: Once | ORAL | Status: AC
  Administered 2020-06-04: 1000 mg via ORAL
  Filled 2020-06-04: qty 2

## 2020-06-04 SURGICAL SUPPLY — 8 items
GLOVE BIO SURGEON STRL SZ7.5 (GLOVE) ×2 IMPLANT
GLOVE BIOGEL PI IND STRL 7.0 (GLOVE) IMPLANT
GLOVE BIOGEL PI INDICATOR 7.0 (GLOVE) ×1
INST BIOPSY MAXCORE 18GX25 (NEEDLE) ×1 IMPLANT
INSTR BIOPSY MAXCORE 18GX20 (NEEDLE) ×1 IMPLANT
KIT TURNOVER KIT A (KITS) IMPLANT
NDL SAFETY ECLIPSE 18X1.5 (NEEDLE) IMPLANT
NEEDLE HYPO 18GX1.5 SHARP (NEEDLE) ×2

## 2020-06-04 NOTE — Discharge Instructions (Signed)
For several days the patient:   should increase his fluid intake and limit strenuous activity.  he might have mild discomfort at the base of his penis or in his rectum.  he might have blood in his urine or blood in his bowel movements.  For 2-3 months he might have blood in his ejaculate (semen).  Call the office immedicately:   for blood clots in the urine or bowel movements,   difficulty urinating,   inability to urinate,   urinary retention,   painful or frequent urination,   fever, chills,   nausea, vomiting,  other illness.    The prostate biopsy pathology reports are usually available within 3-5 working days, unless a pathologic second opinion is required, which may take 7-14 days.   [facility] to check on the status of his biopsy if he has not heard from [place] within 7 days.  [facility]:  [phone]

## 2020-06-04 NOTE — Op Note (Signed)
Preoperative diagnosis:  1. Elevated PSA  Postoperative diagnosis:  1. Same  Procedure: 1. Transrectal ultrasound guided saturation prostate biopsy  Surgeon: Ardis Hughs, MD  Anesthesia: MAC Complications: None  Intraoperative findings:  #1: The patient's prostate measured approximately 87 g, there was no obvious median lobe.  The patient was noted to have a long prostatic urethra. 2.:  30 biopsy specimens were obtained.  EBL: Minimal  Specimens:  2 specimens were obtained in each of the 12 traditional locations including medial and lateral base, medial and lateral mid, and medial and lateral apex from both sides.  In addition, I took 3 specimens from the anterior part of the prostate on both sides for a total of 30 biopsy cores.  Indication: Eric Ball is a 69 y.o. patient with an elevated PSA and previous negative biopsy..  After reviewing the management options for treatment, he elected to proceed with the above surgical procedure(s). We have discussed the potential benefits and risks of the procedure, side effects of the proposed treatment, the likelihood of the patient achieving the goals of the procedure, and any potential problems that might occur during the procedure or recuperation. Informed consent has been obtained.  Description of procedure:  The patient was taken to the operating room and MAC was initiated.  He was then brought to the edge of the gurney in the lateral decubitus position.  A timeout was performed.  A transrectal ultrasound was then inserted and a prostate ultrasound was obtained.  I then proceeded to perform a saturation biopsy obtaining 2 cores in each of the 12 traditional locations for a standard 12 core template as well as 3 anterior biopsies from each side for a total of 30 biopsy cores.  There was no significant bleeding noted.  The transrectal ultrasound was removed and the patient was rolled back onto his back in the supine position.  He  was subsequently returned to the PACU in stable condition.  Ardis Hughs, M.D.

## 2020-06-04 NOTE — H&P (Signed)
08/31/2017:  Status post prostate biopsy on 03/07/2017 for a PSA of 5.5. This revealed 1 core of atypia.   The patient underwent a MRI of the prostate on 08/25/2017. This revealed no evidence of lesions suspicious for malignancy. MRI sizing of the gland was 71 cc. The patient continues on Flomax and has been doing well from this standpoint. Nocturia has improved from 2-3 times a night down to 1 time a night. His stream is better and he feels like he is emptying better as well. He no longer has to strain as much.   03/16/2018:  Patient's most recent PSA was 4.09 on 01/10/2018. The patient has been doing well. Voiding symptoms are stable. He continues to have nocturia 1 time a night, a moderate stream. He feels like he is doing well and this regard. He also complains about erectile dysfunction. He does have a significant cardiac history. He had to take nitroglycerin 2 years ago prior to receiving a stent. He carries it around with him but has not had to take it since.   03/21/2019  Patient continues to have significant voiding complaints. Most bothersome complaint is nocturia 3-4 times a night. He continues on Flomax which he takes nightly. Most recent PSA was 4.4. He continues with erectile dysfunction. No interest in treatment right now.   11/19/2019  Patient is now taking Flomax 0.8 mg daily. He is stable in this regard. He continues to have nocturia x3, straining to void, weak stream, intermittency. He is not interested in other treatment. He decided against finasteride. He was worried about the possible side effects. Most recent PSA relatively stable at 4.99.   05/18/2020: Continued on tamsulosin 0.8 mg at last office visit. Definitive outlet reduction procedures including the minimally invasive ones were discussed again at last office visit but patient deferred. His PSA continues to rise, now 5.63. He had a biopsy in 2018 which only showed atypia in 1 core. He has had a benign MRI as well.   Patient  denies significantly bothersome daytime voiding symptoms. He is most bothered by nighttime voiding symptoms with associated hesitancy/straining and weak/intermittent stream. He gets up 4-5 times nightly despite ongoing use of tamsulosin. He denies any interval burning or painful urination, visible blood in the urine, interval treatment for UTI.     CC: AUA Questions Scoring.  HPI: Eric Ball is a 69 year-old male established patient who is here AUA Questions.      AUA Symptom Score: 50% of the time he has the sensation of not emptying his bladder completely when finished urinating. 50% of the time he has to urinate again fewer than two hours after he has finished urinating. Almost always he has to start and stop again several times when he urinates. 50% of the time he finds it difficult to postpone urination. More than 50% of the time he has a weak urinary stream. More than 50% of the time he has to push or strain to begin urination. He has to get up to urinate 4 times from the time he goes to bed until the time he gets up in the morning.   Calculated AUA Symptom Score: 26    ALLERGIES: crestor    MEDICATIONS: Metformin Hcl 1,000 mg tablet  Aspirin Ec 81 mg tablet, delayed release  Brilinta  Carvedilol 12.5 mg tablet  Glimepiride 1 mg tablet  Omega 3  Pantoprazole Sodium 40 mg tablet, delayed release  Repatha Sureclick  Valsartan     GU PSH: Prostate  Needle Biopsy - 2018     NON-GU PSH: Back Surgery (Unspecified) - about 1991 Cardiac Stent Placement - about 2004 Surgical Pathology, Gross And Microscopic Examination For Prostate Needle - 2018     GU PMH: ED due to arterial insufficiency - 2019 BPH w/LUTS - 2019 Elevated PSA - 2019, - 2018, - 2018, - 2018 Nocturia - 2019 Atypia of the prostate - 2018 Weak Urinary Stream - 2018    NON-GU PMH: Coronary Artery Disease Diabetes Type 2 Diabetes Type 2 GERD Mixed hyperlipidemia Myocardial Infarction    FAMILY HISTORY:  No Family History    SOCIAL HISTORY: Marital Status: Married Preferred Language: English; Race: White Current Smoking Status: Patient has never smoked.   Tobacco Use Assessment Completed: Used Tobacco in last 30 days? Does not use smokeless tobacco. Has never drank.  Does not use drugs. Drinks 2 caffeinated drinks per day. Has not had a blood transfusion.     Notes: 2 sons, 1 daughter    REVIEW OF SYSTEMS:    GU Review Male:   Patient reports frequent urination, get up at night to urinate, stream starts and stops, trouble starting your stream, and have to strain to urinate . Patient denies hard to postpone urination, burning/ pain with urination, leakage of urine, erection problems, and penile pain.  Gastrointestinal (Upper):   Patient denies nausea, vomiting, and indigestion/ heartburn.  Gastrointestinal (Lower):   Patient denies diarrhea and constipation.  Constitutional:   Patient denies fever, night sweats, weight loss, and fatigue.  Skin:   Patient denies itching and skin rash/ lesion.  Eyes:   Patient denies blurred vision and double vision.  Ears/ Nose/ Throat:   Patient denies sore throat and sinus problems.  Hematologic/Lymphatic:   Patient denies swollen glands and easy bruising.  Cardiovascular:   Patient denies leg swelling and chest pains.  Respiratory:   Patient denies cough and shortness of breath.  Endocrine:   Patient denies excessive thirst.  Musculoskeletal:   Patient denies back pain and joint pain.  Neurological:   Patient denies headaches and dizziness.  Psychologic:   Patient denies depression and anxiety.   VITAL SIGNS:      05/18/2020 09:42 AM  Weight 170 lb / 77.11 kg  Height 60 in / 152.4 cm  BP 124/73 mmHg  Pulse 76 /min  Temperature 97.5 F / 36.3 C  BMI 33.2 kg/m   GU PHYSICAL EXAMINATION:    Anus and Perineum: No hemorrhoids. No anal stenosis. No rectal fissure, no anal fissure. No edema, no dimple, no perineal tenderness, no anal tenderness.   Prostate: Prostate 3 + size. Left lobe normal consistency, right lobe normal consistency. Symmetrical lobes. No prostate nodule. Left lobe no tenderness, right lobe no tenderness.   Seminal Vesicles: Nonpalpable.  Sphincter Tone: Normal sphincter. No rectal tenderness. No rectal mass.    MULTI-SYSTEM PHYSICAL EXAMINATION:    Constitutional: Well-nourished. No physical deformities. Normally developed. Good grooming.  Neck: Neck symmetrical, not swollen. Normal tracheal position.  Respiratory: No labored breathing, no use of accessory muscles.   Cardiovascular: Normal temperature, normal extremity pulses, no swelling, no varicosities.  Skin: No paleness, no jaundice, no cyanosis. No lesion, no ulcer, no rash.  Neurologic / Psychiatric: Oriented to time, oriented to place, oriented to person. No depression, no anxiety, no agitation.  Gastrointestinal: No mass, no tenderness, no rigidity, non obese abdomen.  Musculoskeletal: Normal gait and station of head and neck.     Complexity of Data:  Source Of History:  Patient, Medical Record Summary  Lab Test Review:   PSA  Records Review:   AUA Symptom Score, Pathology Reports, Previous Doctor Records, Previous Hospital Records, Previous Patient Records  Urine Test Review:   Urinalysis  Urodynamics Review:   Review Bladder Scan   05/14/20 11/14/19 03/14/19 03/12/18  PSA  Total PSA 5.63 ng/mL 4.99 ng/mL 4.44 ng/mL 4.09 ng/mL    05/18/20  Urinalysis  Urine Appearance Clear   Urine Color Yellow   Urine Glucose Trace mg/dL  Urine Bilirubin Neg mg/dL  Urine Ketones Neg mg/dL  Urine Specific Gravity 1.025   Urine Blood Neg ery/uL  Urine pH 5.5   Urine Protein Neg mg/dL  Urine Urobilinogen 0.2 mg/dL  Urine Nitrites Neg   Urine Leukocyte Esterase Neg leu/uL   PROCEDURES:         PVR Ultrasound - 51798  Scanned Volume: 33 cc         Urinalysis - 81003 Dipstick Dipstick Cont'd  Color: Yellow Bilirubin: Neg  Appearance: Clear Ketones: Neg   Specific Gravity: 1.025 Blood: Neg  pH: 5.5 Protein: Neg  Glucose: Trace Urobilinogen: 0.2    Nitrites: Neg    Leukocyte Esterase: Neg    Notes:      ASSESSMENT:      ICD-10 Details  1 GU:   BPH w/LUTS - N40.1 Chronic, Worsening - Tamsulosin no longer effective in controlling night time symptoms. He is not interested in trying finasteride. We discussed additional evaluation for candidacy of definitive outlet reduction procedure but patient does not want to pursue that at this time. I will try another alpha-blocker therapy with him in the form of generic Rapaflo. He understands ultimately he may benefit from a definitive outlet reduction procedure.  2   Nocturia - R35.1 Chronic, Stable  3   Elevated PSA - R97.20 Chronic, Worsening - Benign DRE today. PSA continues to rise now further elevated than time of his prostate biopsy in 2018. He is adamant against undergoing another biopsy without anesthesia. I will have to discuss with some of the other providers here in clinic about pursuing this especially in Dr. Purvis Sheffield absence. He will be contacted with plan of care moving forward.   PLAN:            Medications New Meds: Silodosin 8 mg capsule 1 capsule PO Q HS   #30  11 Refill(s)    Stop Meds: Tamsulosin Hcl 0.4 mg capsule 2 capsule PO Q PM  Start: 10/07/2019  Stop: 07/03/2020   Discontinue: 05/18/2020  - Reason: The medication was ineffective.            Schedule Return Visit/Planned Activity: Other See Visit Notes - Follow up MD, Schedule Surgery             Note: I'll discuss with some of the other providers about repeat prostate biopsy under anesthesia.           Document Letter(s):  Created for Patient: Clinical Summary         Notes:   Finasteride previously discussed with the patient, he does not want to pursue this medication due to fear for side effects. Evaluation for definitive outlet reduction procedure has also been discussed but patient is quite hesitant about  pursuing this due to fear of associated pain/discomfort with the procedure. Ultimately he may benefit from this as he has not demonstrated any subjective improvement with ongoing alpha-blocker therapy. He is willing to try an additional alpha-blocker medication today. I will prescribe  the generic form of Rapaflo and he will discontinue tamsulosin at this time. He will take the medication every evening as previously directed when taking tamsulosin. Potential for side effects and adverse reactions were reviewed.   Patient's PSA continues to elevate, now higher than when he underwent prostate biopsy a few years ago. He has had a negative MRI a few years ago as well. He would likely benefit from repeat biopsy due to rising PSA but patient is adamant about undergoing the procedure here in the office. He requests general anesthesia. Due to his primary urologist being on medical leave, I will have to discuss plan of care moving forward with 1 of the other providers here in the practice. Once this is accomplished, the patient will be contacted with recommended plan of care moving forward.

## 2020-06-04 NOTE — Anesthesia Postprocedure Evaluation (Signed)
Anesthesia Post Note  Patient: Eric Ball  Procedure(s) Performed: SATURATION BIOPSY TRANSRECTAL ULTRASONIC PROSTATE (TUBP) (N/A )     Patient location during evaluation: PACU Anesthesia Type: MAC Level of consciousness: awake and alert Pain management: pain level controlled Vital Signs Assessment: post-procedure vital signs reviewed and stable Respiratory status: spontaneous breathing and respiratory function stable Cardiovascular status: stable Postop Assessment: no apparent nausea or vomiting Anesthetic complications: no   No complications documented.  Last Vitals:  Vitals:   06/04/20 0830 06/04/20 0845  BP: 130/67 (!) 147/89  Pulse: 63 74  Resp: 14 18  Temp: (!) 36.4 C (!) 36.4 C  SpO2: 94% 96%    Last Pain:  Vitals:   06/04/20 0845  TempSrc:   PainSc: 0-No pain                 Lalla Laham DANIEL

## 2020-06-04 NOTE — Transfer of Care (Signed)
Immediate Anesthesia Transfer of Care Note  Patient: Eric Ball  Procedure(s) Performed: SATURATION BIOPSY TRANSRECTAL ULTRASONIC PROSTATE (TUBP) (N/A )  Patient Location: PACU  Anesthesia Type:MAC  Level of Consciousness: awake, alert  and oriented  Airway & Oxygen Therapy: Patient Spontanous Breathing and Patient connected to face mask  Post-op Assessment: Report given to RN and Post -op Vital signs reviewed and stable  Post vital signs: Reviewed and stable  Last Vitals:  Vitals Value Taken Time  BP 136/82 06/04/20 0804  Temp    Pulse 68 06/04/20 0807  Resp 16 06/04/20 0807  SpO2 97 % 06/04/20 0807  Vitals shown include unvalidated device data.  Last Pain:  Vitals:   06/04/20 0551  TempSrc: Oral  PainSc:          Complications: No complications documented.

## 2020-06-05 ENCOUNTER — Encounter (HOSPITAL_COMMUNITY): Payer: Self-pay | Admitting: Urology

## 2020-06-05 DIAGNOSIS — G4733 Obstructive sleep apnea (adult) (pediatric): Secondary | ICD-10-CM | POA: Diagnosis not present

## 2020-06-05 LAB — SURGICAL PATHOLOGY

## 2020-06-06 ENCOUNTER — Encounter (HOSPITAL_COMMUNITY): Payer: Self-pay | Admitting: Emergency Medicine

## 2020-06-06 ENCOUNTER — Emergency Department (HOSPITAL_COMMUNITY)
Admission: EM | Admit: 2020-06-06 | Discharge: 2020-06-06 | Disposition: A | Discharge status: Home / Self Care | Payer: Medicare HMO | Attending: Emergency Medicine | Admitting: Emergency Medicine

## 2020-06-06 ENCOUNTER — Other Ambulatory Visit: Payer: Self-pay

## 2020-06-06 DIAGNOSIS — R1030 Lower abdominal pain, unspecified: Secondary | ICD-10-CM | POA: Diagnosis not present

## 2020-06-06 DIAGNOSIS — Z7982 Long term (current) use of aspirin: Secondary | ICD-10-CM | POA: Insufficient documentation

## 2020-06-06 DIAGNOSIS — I10 Essential (primary) hypertension: Secondary | ICD-10-CM | POA: Diagnosis not present

## 2020-06-06 DIAGNOSIS — R319 Hematuria, unspecified: Secondary | ICD-10-CM | POA: Diagnosis not present

## 2020-06-06 DIAGNOSIS — Z7984 Long term (current) use of oral hypoglycemic drugs: Secondary | ICD-10-CM | POA: Insufficient documentation

## 2020-06-06 DIAGNOSIS — E871 Hypo-osmolality and hyponatremia: Secondary | ICD-10-CM | POA: Diagnosis not present

## 2020-06-06 DIAGNOSIS — E119 Type 2 diabetes mellitus without complications: Secondary | ICD-10-CM | POA: Insufficient documentation

## 2020-06-06 DIAGNOSIS — R339 Retention of urine, unspecified: Secondary | ICD-10-CM

## 2020-06-06 DIAGNOSIS — I2510 Atherosclerotic heart disease of native coronary artery without angina pectoris: Secondary | ICD-10-CM | POA: Insufficient documentation

## 2020-06-06 LAB — CBC WITH DIFFERENTIAL/PLATELET
Abs Immature Granulocytes: 0.04 10*3/uL (ref 0.00–0.07)
Basophils Absolute: 0 10*3/uL (ref 0.0–0.1)
Basophils Relative: 1 %
Eosinophils Absolute: 0 10*3/uL (ref 0.0–0.5)
Eosinophils Relative: 0 %
HCT: 39.8 % (ref 39.0–52.0)
Hemoglobin: 13.4 g/dL (ref 13.0–17.0)
Immature Granulocytes: 1 %
Lymphocytes Relative: 13 %
Lymphs Abs: 1.1 10*3/uL (ref 0.7–4.0)
MCH: 30.3 pg (ref 26.0–34.0)
MCHC: 33.7 g/dL (ref 30.0–36.0)
MCV: 90 fL (ref 80.0–100.0)
Monocytes Absolute: 0.7 10*3/uL (ref 0.1–1.0)
Monocytes Relative: 8 %
Neutro Abs: 6.8 10*3/uL (ref 1.7–7.7)
Neutrophils Relative %: 77 %
Platelets: 226 10*3/uL (ref 150–400)
RBC: 4.42 MIL/uL (ref 4.22–5.81)
RDW: 12.5 % (ref 11.5–15.5)
WBC: 8.7 10*3/uL (ref 4.0–10.5)
nRBC: 0 % (ref 0.0–0.2)

## 2020-06-06 LAB — BASIC METABOLIC PANEL
Anion gap: 14 (ref 5–15)
BUN: 25 mg/dL — ABNORMAL HIGH (ref 8–23)
CO2: 19 mmol/L — ABNORMAL LOW (ref 22–32)
Calcium: 9.3 mg/dL (ref 8.9–10.3)
Chloride: 98 mmol/L (ref 98–111)
Creatinine, Ser: 1.2 mg/dL (ref 0.61–1.24)
GFR, Estimated: >60 mL/min (ref >60)
Glucose, Bld: 220 mg/dL — ABNORMAL HIGH (ref 70–99)
Potassium: 5.1 mmol/L (ref 3.5–5.1)
Sodium: 131 mmol/L — ABNORMAL LOW (ref 135–145)

## 2020-06-06 LAB — URINALYSIS, ROUTINE W REFLEX MICROSCOPIC
Bacteria, UA: NONE SEEN
Bilirubin Urine: NEGATIVE
Glucose, UA: 150 mg/dL — AB
Ketones, ur: 5 mg/dL — AB
Leukocytes,Ua: NEGATIVE
Nitrite: NEGATIVE
Protein, ur: NEGATIVE mg/dL
RBC / HPF: >50 RBC/hpf — ABNORMAL HIGH (ref 0–5)
Specific Gravity, Urine: 1.017 (ref 1.005–1.030)
pH: 5 (ref 5.0–8.0)

## 2020-06-06 MED ORDER — OXYBUTYNIN CHLORIDE 5 MG PO TABS
5.0000 mg | ORAL_TABLET | Freq: Once | ORAL | Status: AC
  Administered 2020-06-06: 5 mg via ORAL
  Filled 2020-06-06: qty 1

## 2020-06-06 NOTE — Discharge Instructions (Signed)
You were seen in the emergency department for evaluation of being unable to urinate.  You were in urinary retention and your symptoms improved after a catheter was placed.  You will need to keep this catheter in until you follow-up with urology.  Return to the emergency department if the catheter does not seem to be working correctly.  Call your urologist on Monday for close follow-up.

## 2020-06-06 NOTE — ED Provider Notes (Signed)
[facility] DEPT Provider Note   CSN: [id number] Arrival date & time: 06/06/20  2005     History Chief Complaint  Patient presents with  . Urinary Retention    Eric Ball is a 69 y.o. male.  He has a history of elevated PSA and had a prostate biopsy done 2 days ago.  He was urinating well yesterday but today could only pass a few dribbles.  Saw some blood.  Complaining of low abdominal pain.  No known fevers.  Has never required catheter before.  The history is provided by the patient.  Abdominal Pain Pain location:  Suprapubic Pain quality: aching   Pain radiates to:  Does not radiate Pain severity:  Severe Onset quality:  Gradual Timing:  Constant Progression:  Worsening Chronicity:  New Relieved by:  Nothing Worsened by:  Nothing Ineffective treatments:  None tried Associated symptoms: hematuria   Associated symptoms: no chest pain, no cough, no diarrhea, no dysuria, no fever, no nausea, no shortness of breath, no sore throat and no vomiting        Past Medical History:  Diagnosis Date  . Complication of anesthesia    "I'm slow to come out"  . Coronary artery disease 2015   PTCA with DES to Cx; PCI  BMS 2004; LHC 08/2019 DES to LAD  . GERD (gastroesophageal reflux disease)   . H/O hiatal hernia   . History of myocardial infarction    with a stent placed at that time  . Hypercholesterolemia    with intolerant ot many of the cholesterol medicaitons  . Hypertension    essentai hypertension  . Ischemic heart disease   . Myocardial infarction [facility]) 2004   with a stent placed at that time  . Nephrosis  1960's  . Sleep apnea    "suppose to wear mask; I don't wear it enough" (11/15/2013)  . Type II diabetes mellitus [facility])     Patient Active Problem List   Diagnosis Date Noted  . Statin myopathy 01/07/2020  . NSTEMI (non-ST elevated myocardial infarction) ([place]) 09/07/2019  . History of colonic polyps 11/14/2014  . Unstable  angina ([place]) 11/15/2013  . Angina pectoris ([place]) 11/13/2013  . Malaise and fatigue 02/18/2013  . Coronary artery disease   . DM II (diabetes mellitus, type II), controlled ([place])   . Hypercholesterolemia   . Ischemic heart disease   . Hypertension     Past Surgical History:  Procedure Laterality Date  . BACK SURGERY    . COLONOSCOPY  10/07/2011   [name]:multiple colonic polyps removed/colonic diverticulosis, tubular adenoma  . COLONOSCOPY N/A 12/08/2014   Procedure: COLONOSCOPY;  Surgeon: [name], MD;  [facility];  Service: Endoscopy;  Laterality: N/A;  930  . CORONARY ANGIOPLASTY WITH STENT PLACEMENT  09/25/2002   EF-approximately 45% / stent placement to the mid arteriovenous circumflex with reduction of 99% narrowing to 0% with placement of a 3.0 x 16 mm Express II stent with improvement of TIMI grade 2 to TIMI grade 3 flow.  . CORONARY ANGIOPLASTY WITH STENT PLACEMENT  11/15/2013   "1"  . CORONARY STENT INTERVENTION N/A 07/03/2017   Procedure: CORONARY STENT INTERVENTION;  Surgeon: Nelva Bush, MD;  Location: [place] CV LAB;  Service: Cardiovascular;  Laterality: N/A;  . CORONARY STENT INTERVENTION N/A 09/09/2019   Procedure: CORONARY STENT INTERVENTION;  Surgeon: Sherren Mocha, MD;  Location: [place] CV LAB;  Service: Cardiovascular;  Laterality: N/A;  . EYE SURGERY    .  LEFT HEART CATH AND CORONARY ANGIOGRAPHY N/A 09/09/2019   Procedure: LEFT HEART CATH AND CORONARY ANGIOGRAPHY;  Surgeon: Sherren Mocha, MD;  Location: [place] CV LAB;  Service: Cardiovascular;  Laterality: N/A;  . LEFT HEART CATHETERIZATION WITH CORONARY ANGIOGRAM N/A 11/15/2013   Procedure: LEFT HEART CATHETERIZATION WITH CORONARY ANGIOGRAM;  Surgeon: Burnell Blanks, MD;  Location: [facility] CATH LAB;  Service: Cardiovascular;  Laterality: N/A;  . LUMBAR [place]  . NASAL SEPTUM SURGERY  02/20/2001   Septal reconstruction and turbinate reduction  . PROSTATE BIOPSY  N/A 06/04/2020   Procedure: SATURATION BIOPSY TRANSRECTAL ULTRASONIC PROSTATE (TUBP);  Surgeon: Ardis Hughs, MD;  [facility];  Service: Urology;  Laterality: N/A;  . RIGHT/LEFT HEART CATH AND CORONARY ANGIOGRAPHY N/A 07/03/2017   Procedure: RIGHT/LEFT HEART CATH AND CORONARY ANGIOGRAPHY;  Surgeon: Nelva Bush, MD;  Location: [place] CV LAB;  Service: Cardiovascular;  Laterality: N/A;       Family History  Problem Relation Age of Onset  . Coronary artery disease Father        with multiple angioplasties  . Healthy Sister   . Hypertension Other        runs in the family  . Colon cancer Neg Hx     Social History   Tobacco Use  . Smoking status: Never Smoker  . Smokeless tobacco: Never Used  . Tobacco comment: Never smoker  Vaping Use  . Vaping Use: Never used  Substance Use Topics  . Alcohol use: No    Alcohol/week: 0.0 standard drinks  . Drug use: No    Home Medications Prior to Admission medications   Medication Sig Start Date End Date Taking? Authorizing Provider  Alirocumab (PRALUENT) 150 MG/ML SOAJ Inject 1 pen into the skin every 14 (fourteen) days. 08/05/19   Lelon Perla, MD  aspirin EC 81 MG EC tablet Take 1 tablet (81 mg total) by mouth daily. 09/11/19   Duke, Tami Lin, PA  azelastine (ASTELIN) 0.1 % nasal spray Place 2 sprays into both nostrils 2 (two) times daily as needed for allergies.  04/07/20   [provider]  carvedilol (COREG) 12.5 MG tablet TAKE 1 TABLET BY MOUTH TWICE A DAY WITH A MEAL Patient taking differently: Take 12.5 mg by mouth 2 (two) times daily with a meal.  01/27/20   Crenshaw, Denice Bors, MD  Cholecalciferol (VITAMIN D3 PO) Take 1 capsule by mouth daily.    [provider]  fluticasone (FLONASE) 50 MCG/ACT nasal spray Place 1 spray into both nostrils daily as needed for allergies. 05/07/20   [provider]  glimepiride (AMARYL) 2 MG tablet Take 2 mg by mouth daily as needed (high blood  sugar).    [provider]  ketoconazole (NIZORAL) 2 % cream Apply 1 application topically daily as needed for irritation.    [provider]  loperamide (IMODIUM) 2 MG capsule Take 2 mg by mouth as needed for diarrhea or loose stools.    [provider]  metFORMIN (GLUCOPHAGE) 1000 MG tablet Take 1 tablet (1,000 mg total) by mouth 2 (two) times daily with a meal. Resume metformin on 09/12/19. Patient taking differently: Take 500-1,000 mg by mouth See admin instructions. Take 500 mg twice daily, increase to 1000 mg twice daily if blood sugar is over 140 09/10/19   Duke, Tami Lin, PA  Multiple Vitamins-Minerals (ZINC PO) Take 1 tablet by mouth daily.    [provider]  nitroGLYCERIN (NITROSTAT) 0.4 MG SL  tablet Place 1 tablet (0.4 mg total) under the tongue every 5 (five) minutes as needed for chest pain. 09/19/19   Deberah Pelton, NP  omega-3 acid ethyl esters (LOVAZA) 1 g capsule TAKE 2 CAPSULES BY MOUTH TWICE A DAY 03/19/18   Lelon Perla, MD  Omega-3 Fatty Acids (FISH OIL) 1200 MG CAPS Take 1,200 mg by mouth 2 (two) times daily.    [provider]  pantoprazole (PROTONIX) 40 MG tablet Take 1 tablet (40 mg total) by mouth daily. 12/04/15   Lelon Perla, MD  QUERCETIN PO Take 1 tablet by mouth daily.    [provider]  silodosin (RAPAFLO) 8 MG CAPS capsule Take 8 mg by mouth at bedtime.    [provider]  ticagrelor (BRILINTA) 90 MG TABS tablet Take 1 tablet (90 mg total) by mouth 2 (two) times daily. Resume once all bleeding has stopped for 48 hours from prostate biopsy 06/04/20   Ardis Hughs, MD  valsartan (DIOVAN) 160 MG tablet TAKE 1 TABLET BY MOUTH EVERY DAY Patient taking differently: Take 160 mg by mouth daily.  09/25/19   Lelon Perla, MD  Omeprazole Magnesium (PRILOSEC OTC PO) Take 1 capsule by mouth daily. Taking  Every other day  10/06/11  [provider]    Allergies    Statins  Review  of Systems   Review of Systems  Constitutional: Negative for fever.  HENT: Negative for sore throat.   Eyes: Negative for visual disturbance.  Respiratory: Negative for cough and shortness of breath.   Cardiovascular: Negative for chest pain.  Gastrointestinal: Positive for abdominal pain. Negative for diarrhea, nausea and vomiting.  Genitourinary: Positive for difficulty urinating and hematuria. Negative for dysuria.  Musculoskeletal: Negative for neck pain.  Skin: Negative for rash.  Neurological: Negative for headaches.    Physical Exam Updated Vital Signs BP (!) 167/96 (BP Location: Left Arm)   Pulse 93   Temp 98.1 F (36.7 C) (Oral)   Resp 18   Ht 5\' 9"  (1.753 m)   Wt 77.1 kg   SpO2 99%   BMI 25.10 kg/m   Physical Exam Vitals and nursing note reviewed.  Constitutional:      Appearance: Normal appearance. He is well-developed.  HENT:     Head: Normocephalic and atraumatic.  Eyes:     Conjunctiva/sclera: Conjunctivae normal.  Cardiovascular:     Rate and Rhythm: Normal rate and regular rhythm.     Heart sounds: No murmur heard.   Pulmonary:     Effort: Pulmonary effort is normal. No respiratory distress.     Breath sounds: Normal breath sounds.  Abdominal:     Palpations: Abdomen is soft.     Tenderness: There is abdominal tenderness (suprapubic). There is no guarding or rebound.  Musculoskeletal:        General: No deformity or signs of injury. Normal range of motion.     Cervical back: Neck supple.  Skin:    General: Skin is warm and dry.  Neurological:     General: No focal deficit present.     Mental Status: He is alert.     ED Results / Procedures / Treatments   Labs (all labs ordered are listed, but only abnormal results are displayed) Labs Reviewed  URINALYSIS, ROUTINE W REFLEX MICROSCOPIC - Abnormal; Notable for the following components:      Result Value   Glucose, UA 150 (*)    Hgb urine dipstick LARGE (*)  Ketones, ur 5 (*)    RBC / HPF  >50 (*)    All other components within normal limits  BASIC METABOLIC PANEL - Abnormal; Notable for the following components:   Sodium 131 (*)    CO2 19 (*)    Glucose, Bld 220 (*)    BUN 25 (*)    All other components within normal limits  URINE CULTURE  CBC WITH DIFFERENTIAL/PLATELET    EKG None  Radiology No results found.  Procedures Procedures (including critical care time)  Medications Ordered in ED Medications  oxybutynin (DITROPAN) tablet 5 mg (has no administration in time range)    ED Course  I have reviewed the triage vital signs and the nursing notes.  Pertinent labs & imaging results that were available during my care of the patient were reviewed by me and considered in my medical decision making (see chart for details).  Clinical Course as of Jun 07 1034  Sat Jun 06, 2020  2050 Patient had greater than 600 cc on bladder scan.  Catheter was placed with good improvement in his symptoms.  Will check urinalysis and urine culture along with renal function.   [MB]    Clinical Course User Index [MB] Hayden Rasmussen, MD   MDM Rules/Calculators/A&P                         This patient complains of suprapubic abdominal pain and difficulty urinating; this involves an extensive number of treatment Options and is a complaint that carries with it a high risk of complications and Morbidity. The differential includes urinary retention, UTI prostatitis  I ordered, reviewed and interpreted labs, which included CBC with normal white count normal hemoglobin, chemistries with mildly low sodium and elevated glucose, low bicarb, urinalysis with microscopic hematuria no evidence of infection I ordered medication Ditropan for bladder spasm Previous records obtained and reviewed in epic, had a prostate biopsy done just 2 days ago by Dr. Louis Meckel  After the interventions stated above, I reevaluated the patient and found patient to be feeling much better after Foley placement.   Draining yellow urine.  No clots.  He will keep the catheter in a.m. contact urology on Monday.  Return instructions discussed.   Final Clinical Impression(s) / ED Diagnoses Final diagnoses:  Urinary retention  Hematuria, unspecified type    Rx / DC Orders ED Discharge Orders    None       Hayden Rasmussen, MD 06/07/20 1037

## 2020-06-06 NOTE — ED Triage Notes (Signed)
Pt reports having urinary retention that started today. Pt reports having biopsy done on 06/04/20 on prostate.

## 2020-06-08 LAB — URINE CULTURE: Culture: NO GROWTH

## 2020-06-11 DIAGNOSIS — N401 Enlarged prostate with lower urinary tract symptoms: Secondary | ICD-10-CM | POA: Diagnosis not present

## 2020-06-11 DIAGNOSIS — R338 Other retention of urine: Secondary | ICD-10-CM | POA: Diagnosis not present

## 2020-06-11 DIAGNOSIS — R3912 Poor urinary stream: Secondary | ICD-10-CM | POA: Diagnosis not present

## 2020-06-12 DIAGNOSIS — R338 Other retention of urine: Secondary | ICD-10-CM | POA: Diagnosis not present

## 2020-06-14 DIAGNOSIS — G4733 Obstructive sleep apnea (adult) (pediatric): Secondary | ICD-10-CM | POA: Diagnosis not present

## 2020-06-23 DIAGNOSIS — R338 Other retention of urine: Secondary | ICD-10-CM | POA: Diagnosis not present

## 2020-06-23 DIAGNOSIS — R69 Illness, unspecified: Secondary | ICD-10-CM | POA: Diagnosis not present

## 2020-07-07 ENCOUNTER — Other Ambulatory Visit: Payer: Self-pay | Admitting: Cardiology

## 2020-07-13 DIAGNOSIS — G4733 Obstructive sleep apnea (adult) (pediatric): Secondary | ICD-10-CM | POA: Diagnosis not present

## 2020-07-13 DIAGNOSIS — E538 Deficiency of other specified B group vitamins: Secondary | ICD-10-CM | POA: Diagnosis not present

## 2020-08-04 ENCOUNTER — Other Ambulatory Visit: Payer: Self-pay

## 2020-08-04 DIAGNOSIS — R3912 Poor urinary stream: Secondary | ICD-10-CM | POA: Diagnosis not present

## 2020-08-04 DIAGNOSIS — N401 Enlarged prostate with lower urinary tract symptoms: Secondary | ICD-10-CM | POA: Diagnosis not present

## 2020-08-04 DIAGNOSIS — N5201 Erectile dysfunction due to arterial insufficiency: Secondary | ICD-10-CM | POA: Diagnosis not present

## 2020-08-04 DIAGNOSIS — R972 Elevated prostate specific antigen [PSA]: Secondary | ICD-10-CM | POA: Diagnosis not present

## 2020-08-04 MED ORDER — ASPIRIN 81 MG PO TBEC: 81.0000 mg | DELAYED_RELEASE_TABLET | Freq: Every day | ORAL | 3 refills | Status: DC

## 2020-08-05 ENCOUNTER — Other Ambulatory Visit: Payer: Self-pay | Admitting: Cardiology

## 2020-08-06 ENCOUNTER — Other Ambulatory Visit: Payer: Self-pay

## 2020-08-13 DIAGNOSIS — G4733 Obstructive sleep apnea (adult) (pediatric): Secondary | ICD-10-CM | POA: Diagnosis not present

## 2020-09-13 DIAGNOSIS — G4733 Obstructive sleep apnea (adult) (pediatric): Secondary | ICD-10-CM | POA: Diagnosis not present

## 2020-09-15 DIAGNOSIS — I10 Essential (primary) hypertension: Secondary | ICD-10-CM | POA: Diagnosis not present

## 2020-09-15 DIAGNOSIS — E1142 Type 2 diabetes mellitus with diabetic polyneuropathy: Secondary | ICD-10-CM | POA: Diagnosis not present

## 2020-09-15 DIAGNOSIS — Z6824 Body mass index (BMI) 24.0-24.9, adult: Secondary | ICD-10-CM | POA: Diagnosis not present

## 2020-09-15 DIAGNOSIS — E1140 Type 2 diabetes mellitus with diabetic neuropathy, unspecified: Secondary | ICD-10-CM | POA: Diagnosis not present

## 2020-09-15 DIAGNOSIS — N401 Enlarged prostate with lower urinary tract symptoms: Secondary | ICD-10-CM | POA: Diagnosis not present

## 2020-09-20 ENCOUNTER — Other Ambulatory Visit: Payer: Self-pay | Admitting: Cardiology

## 2020-09-20 DIAGNOSIS — I10 Essential (primary) hypertension: Secondary | ICD-10-CM

## 2020-10-28 ENCOUNTER — Telehealth: Payer: Self-pay | Admitting: Cardiology

## 2020-10-28 NOTE — Telephone Encounter (Signed)
Returned call to patient who states he is out of Brilinta after today's morning dose and that his patient assistance ended today. He is wondering if he can stop it for good. Has an appointment with Dr. Stanford Breed on 4/25. Advised that I will forward message to Dr. Stanford Breed and his primary nurse and that someone from our office will call him back. He verbalized understanding and agreement and thanked me for the call.

## 2020-10-28 NOTE — Telephone Encounter (Signed)
Spoke with pt, Aware of dr crenshaw's recommendations.  °

## 2020-10-28 NOTE — Telephone Encounter (Signed)
Patient called and wanted to know if he could get off of taking ticagrelor (BRILINTA) 90 MG TABS tablet. Please call back

## 2020-10-28 NOTE — Telephone Encounter (Signed)
[place]

## 2020-11-09 NOTE — Progress Notes (Signed)
HPI: FU CAD;hehas hadPCI of his circumflex and right coronary artery. Echo December 2018 showed normal LV systolic function.Patient admitted with non-ST elevation myocardial infarction February 2021. He was found to have severe proximal LAD stenosis and was treated with drug-eluting stent. Previous stents in the circumflex and right coronary artery were patent. Ejection fraction 55%. Since last seen,there is no dyspnea, chest pain, palpitations or syncope.  Current Outpatient Medications  Medication Sig Dispense Refill  . aspirin 81 MG EC tablet Take 1 tablet (81 mg total) by mouth daily. 90 tablet 3  . azelastine (ASTELIN) 0.1 % nasal spray Place 2 sprays into both nostrils 2 (two) times daily as needed for allergies.     . carvedilol (COREG) 12.5 MG tablet TAKE 1 TABLET BY MOUTH TWICE A DAY WITH MEALS 180 tablet 1  . Cholecalciferol (VITAMIN D3 PO) Take 1 capsule by mouth daily.    . fluticasone (FLONASE) 50 MCG/ACT nasal spray Place 1 spray into both nostrils daily as needed for allergies.    Marland Kitchen glimepiride (AMARYL) 2 MG tablet Take 2 mg by mouth daily as needed (high blood sugar).    Marland Kitchen ketoconazole (NIZORAL) 2 % cream Apply 1 application topically daily as needed for irritation.    Marland Kitchen loperamide (IMODIUM) 2 MG capsule Take 2 mg by mouth as needed for diarrhea or loose stools.    . metFORMIN (GLUCOPHAGE) 1000 MG tablet Take 1 tablet (1,000 mg total) by mouth 2 (two) times daily with a meal. Resume metformin on 09/12/19. (Patient taking differently: Take 500 mg by mouth 2 (two) times daily with a meal.) 180 tablet 3  . Multiple Vitamins-Minerals (ZINC PO) Take 1 tablet by mouth daily.    . nitroGLYCERIN (NITROSTAT) 0.4 MG SL tablet Place 1 tablet (0.4 mg total) under the tongue every 5 (five) minutes as needed for chest pain. 25 tablet 3  . Omega-3 Fatty Acids (FISH OIL) 1000 MG CAPS Take 1 capsule by mouth daily at 12 noon.    . pantoprazole (PROTONIX) 40 MG tablet Take 1 tablet (40 mg  total) by mouth daily. 90 tablet 3  . QUERCETIN PO Take 1 tablet by mouth daily.    . silodosin (RAPAFLO) 8 MG CAPS capsule Take 8 mg by mouth at bedtime.    . valsartan (DIOVAN) 160 MG tablet TAKE 1 TABLET BY MOUTH EVERY DAY 90 tablet 1   No current facility-administered medications for this visit.     Past Medical History:  Diagnosis Date  . Complication of anesthesia    "I'm slow to come out"  . Coronary artery disease 2015   PTCA with DES to Cx; PCI  BMS 2004; LHC 08/2019 DES to LAD  . GERD (gastroesophageal reflux disease)   . H/O hiatal hernia   . History of myocardial infarction    with a stent placed at that time  . Hypercholesterolemia    with intolerant ot many of the cholesterol medicaitons  . Hypertension    essentai hypertension  . Ischemic heart disease   . Myocardial infarction [facility]) 2004   with a stent placed at that time  . Nephrosis  1960's  . Sleep apnea    "suppose to wear mask; I don't wear it enough" (11/15/2013)  . Type II diabetes mellitus ([place])     Past Surgical History:  Procedure Laterality Date  . BACK SURGERY    . COLONOSCOPY  10/07/2011   [name]:multiple colonic polyps removed/colonic diverticulosis, tubular adenoma  .  COLONOSCOPY N/A 12/08/2014   Procedure: COLONOSCOPY;  Surgeon: [name], MD;  [facility];  Service: Endoscopy;  Laterality: N/A;  930  . CORONARY ANGIOPLASTY WITH STENT PLACEMENT  09/25/2002   EF-approximately 45% / stent placement to the mid arteriovenous circumflex with reduction of 99% narrowing to 0% with placement of a 3.0 x 16 mm Express II stent with improvement of TIMI grade 2 to TIMI grade 3 flow.  . CORONARY ANGIOPLASTY WITH STENT PLACEMENT  11/15/2013   "1"  . CORONARY STENT INTERVENTION N/A 07/03/2017   Procedure: CORONARY STENT INTERVENTION;  Surgeon: Nelva Bush, MD;  Location: [place] CV LAB;  Service: Cardiovascular;  Laterality: N/A;  . CORONARY STENT INTERVENTION N/A 09/09/2019    Procedure: CORONARY STENT INTERVENTION;  Surgeon: Sherren Mocha, MD;  Location: [place] CV LAB;  Service: Cardiovascular;  Laterality: N/A;  . EYE SURGERY    . LEFT HEART CATH AND CORONARY ANGIOGRAPHY N/A 09/09/2019   Procedure: LEFT HEART CATH AND CORONARY ANGIOGRAPHY;  Surgeon: Sherren Mocha, MD;  Location: [place] CV LAB;  Service: Cardiovascular;  Laterality: N/A;  . LEFT HEART CATHETERIZATION WITH CORONARY ANGIOGRAM N/A 11/15/2013   Procedure: LEFT HEART CATHETERIZATION WITH CORONARY ANGIOGRAM;  Surgeon: Burnell Blanks, MD;  Location: [facility] CATH LAB;  Service: Cardiovascular;  Laterality: N/A;  . LUMBAR [place]  . NASAL SEPTUM SURGERY  02/20/2001   Septal reconstruction and turbinate reduction  . PROSTATE BIOPSY N/A 06/04/2020   Procedure: SATURATION BIOPSY TRANSRECTAL ULTRASONIC PROSTATE (TUBP);  Surgeon: Ardis Hughs, MD;  [facility];  Service: Urology;  Laterality: N/A;  . RIGHT/LEFT HEART CATH AND CORONARY ANGIOGRAPHY N/A 07/03/2017   Procedure: RIGHT/LEFT HEART CATH AND CORONARY ANGIOGRAPHY;  Surgeon: Nelva Bush, MD;  Location: [place] CV LAB;  Service: Cardiovascular;  Laterality: N/A;    Social History   Socioeconomic History  . Marital status: Married    Spouse name: Not on file  . Number of children: Not on file  . Years of education: Not on file  . Highest education level: Not on file  Occupational History  . Not on file  Tobacco Use  . Smoking status: Never Smoker  . Smokeless tobacco: Never Used  . Tobacco comment: Never smoker  Vaping Use  . Vaping Use: Never used  Substance and Sexual Activity  . Alcohol use: No    Alcohol/week: 0.0 standard drinks  . Drug use: No  . Sexual activity: Yes  Other Topics Concern  . Not on file  Social History Narrative  . Not on file   Social Determinants of Health   Financial Resource Strain: Not on file  Food Insecurity: Not on file  Transportation Needs: Not on file   Physical Activity: Not on file  Stress: Not on file  Social Connections: Not on file  Intimate Partner Violence: Not on file    Family History  Problem Relation Age of Onset  . Coronary artery disease Father        with multiple angioplasties  . Healthy Sister   . Hypertension Other        runs in the family  . Colon cancer Neg Hx     ROS: no fevers or chills, productive cough, hemoptysis, dysphasia, odynophagia, melena, hematochezia, dysuria, hematuria, rash, seizure activity, orthopnea, PND, pedal edema, claudication. Remaining systems are negative.  Physical Exam: Well-developed well-nourished in no acute distress.  Skin is warm and dry.  HEENT is normal.  Neck is  supple.  Chest is clear to auscultation with normal expansion.  Cardiovascular exam is regular rate and rhythm.  Abdominal exam nontender or distended. No masses palpated. Extremities show no edema. neuro grossly intact  ECG-normal sinus rhythm, no ST changes.  Personally reviewed  A/P  1 coronary artery disease-patient denies recurrent chest pain.  Continue medical therapy with aspirin.  Discontinue Brilinta.  He is intolerant to statins.  2 hyperlipidemia-intolerant to statins.  Continue Praluent.  3 hypertension-blood pressure mildly elevated and he states systolic runs in the 311E to 140s at home.  Increase valsartan to 320 mg daily and follow blood pressure.  Check potassium and renal function in 1 week.  [name], MD

## 2020-11-16 ENCOUNTER — Other Ambulatory Visit: Payer: Self-pay

## 2020-11-16 ENCOUNTER — Ambulatory Visit: Payer: Medicare HMO | Admitting: Cardiology

## 2020-11-16 ENCOUNTER — Encounter: Payer: Self-pay | Admitting: Cardiology

## 2020-11-16 VITALS — BP 134/78 | HR 86 | Ht 70.0 in | Wt 172.2 lb

## 2020-11-16 DIAGNOSIS — I2510 Atherosclerotic heart disease of native coronary artery without angina pectoris: Secondary | ICD-10-CM

## 2020-11-16 DIAGNOSIS — E7800 Pure hypercholesterolemia, unspecified: Secondary | ICD-10-CM | POA: Diagnosis not present

## 2020-11-16 DIAGNOSIS — I10 Essential (primary) hypertension: Secondary | ICD-10-CM | POA: Diagnosis not present

## 2020-11-16 MED ORDER — VALSARTAN 320 MG PO TABS: 320.0000 mg | ORAL_TABLET | Freq: Every day | ORAL | 3 refills | Status: DC

## 2020-11-16 MED ORDER — PRALUENT 150 MG/ML ~~LOC~~ SOAJ: 150.0000 mg | SUBCUTANEOUS | 3 refills | Status: DC

## 2020-11-16 NOTE — Patient Instructions (Signed)
Medication Instructions:   INCREASE VALSARTAN TO 320 MG ONCE DAILY= 2 OF THE 160 MG TABLETS ONCE DAILY  *If you need a refill on your cardiac medications before your next appointment, please call your pharmacy*   Lab Work:  Your physician recommends that you return for lab work in: [place]  If you have labs (blood work) drawn today and your tests are completely normal, you will receive your results only by: Marland Kitchen MyChart Message (if you have MyChart) OR . A paper copy in the mail If you have any lab test that is abnormal or we need to change your treatment, we will call you to review the results.  Follow-Up: At [facility], you and your health needs are our priority.  As part of our continuing mission to provide you with exceptional heart care, we have created designated Provider Care Teams.  These Care Teams include your primary Cardiologist (physician) and Advanced Practice Providers (APPs -  Physician Assistants and Nurse Practitioners) who all work together to provide you with the care you need, when you need it.  We recommend signing up for the patient portal called "MyChart".  Sign up information is provided on this After Visit Summary.  MyChart is used to connect with patients for Virtual Visits (Telemedicine).  Patients are able to view lab/test results, encounter notes, upcoming appointments, etc.  Non-urgent messages can be sent to your provider as well.   To learn more about what you can do with MyChart, go to [email or web address].    Your next appointment:   12 month(s)  The format for your next appointment:   In Person  Provider:   [name], MD

## 2020-11-23 DIAGNOSIS — I10 Essential (primary) hypertension: Secondary | ICD-10-CM | POA: Diagnosis not present

## 2020-11-23 LAB — BASIC METABOLIC PANEL
BUN/Creatinine Ratio: 15 (ref 10–24)
BUN: 18 mg/dL (ref 8–27)
CO2: 22 mmol/L (ref 20–29)
Calcium: 9.6 mg/dL (ref 8.6–10.2)
Chloride: 99 mmol/L (ref 96–106)
Creatinine, Ser: 1.21 mg/dL (ref 0.76–1.27)
Glucose: 195 mg/dL — ABNORMAL HIGH (ref 65–99)
Potassium: 5.3 mmol/L — ABNORMAL HIGH (ref 3.5–5.2)
Sodium: 137 mmol/L (ref 134–144)
eGFR: 65 mL/min/{1.73_m2} (ref >59)

## 2020-12-14 DIAGNOSIS — E039 Hypothyroidism, unspecified: Secondary | ICD-10-CM | POA: Diagnosis not present

## 2020-12-14 DIAGNOSIS — E1165 Type 2 diabetes mellitus with hyperglycemia: Secondary | ICD-10-CM | POA: Diagnosis not present

## 2020-12-14 DIAGNOSIS — E782 Mixed hyperlipidemia: Secondary | ICD-10-CM | POA: Diagnosis not present

## 2020-12-14 DIAGNOSIS — E1140 Type 2 diabetes mellitus with diabetic neuropathy, unspecified: Secondary | ICD-10-CM | POA: Diagnosis not present

## 2020-12-14 DIAGNOSIS — E7849 Other hyperlipidemia: Secondary | ICD-10-CM | POA: Diagnosis not present

## 2020-12-14 DIAGNOSIS — N400 Enlarged prostate without lower urinary tract symptoms: Secondary | ICD-10-CM | POA: Diagnosis not present

## 2020-12-14 DIAGNOSIS — Z1389 Encounter for screening for other disorder: Secondary | ICD-10-CM | POA: Diagnosis not present

## 2020-12-14 DIAGNOSIS — Z0001 Encounter for general adult medical examination with abnormal findings: Secondary | ICD-10-CM | POA: Diagnosis not present

## 2020-12-14 DIAGNOSIS — Z6824 Body mass index (BMI) 24.0-24.9, adult: Secondary | ICD-10-CM | POA: Diagnosis not present

## 2020-12-14 DIAGNOSIS — Z125 Encounter for screening for malignant neoplasm of prostate: Secondary | ICD-10-CM | POA: Diagnosis not present

## 2020-12-14 DIAGNOSIS — B353 Tinea pedis: Secondary | ICD-10-CM | POA: Diagnosis not present

## 2020-12-14 DIAGNOSIS — I10 Essential (primary) hypertension: Secondary | ICD-10-CM | POA: Diagnosis not present

## 2020-12-14 DIAGNOSIS — Z1331 Encounter for screening for depression: Secondary | ICD-10-CM | POA: Diagnosis not present

## 2021-01-27 DIAGNOSIS — R972 Elevated prostate specific antigen [PSA]: Secondary | ICD-10-CM | POA: Diagnosis not present

## 2021-01-31 ENCOUNTER — Other Ambulatory Visit: Payer: Self-pay | Admitting: Cardiology

## 2021-02-01 DIAGNOSIS — N5201 Erectile dysfunction due to arterial insufficiency: Secondary | ICD-10-CM | POA: Diagnosis not present

## 2021-02-01 DIAGNOSIS — R972 Elevated prostate specific antigen [PSA]: Secondary | ICD-10-CM | POA: Diagnosis not present

## 2021-02-01 DIAGNOSIS — N401 Enlarged prostate with lower urinary tract symptoms: Secondary | ICD-10-CM | POA: Diagnosis not present

## 2021-02-01 DIAGNOSIS — R3912 Poor urinary stream: Secondary | ICD-10-CM | POA: Diagnosis not present

## 2021-02-04 DIAGNOSIS — L812 Freckles: Secondary | ICD-10-CM | POA: Diagnosis not present

## 2021-02-04 DIAGNOSIS — D225 Melanocytic nevi of trunk: Secondary | ICD-10-CM | POA: Diagnosis not present

## 2021-02-04 DIAGNOSIS — D2262 Melanocytic nevi of left upper limb, including shoulder: Secondary | ICD-10-CM | POA: Diagnosis not present

## 2021-02-04 DIAGNOSIS — D2261 Melanocytic nevi of right upper limb, including shoulder: Secondary | ICD-10-CM | POA: Diagnosis not present

## 2021-02-04 DIAGNOSIS — D1801 Hemangioma of skin and subcutaneous tissue: Secondary | ICD-10-CM | POA: Diagnosis not present

## 2021-02-04 DIAGNOSIS — D2271 Melanocytic nevi of right lower limb, including hip: Secondary | ICD-10-CM | POA: Diagnosis not present

## 2021-02-04 DIAGNOSIS — L821 Other seborrheic keratosis: Secondary | ICD-10-CM | POA: Diagnosis not present

## 2021-02-04 DIAGNOSIS — D2272 Melanocytic nevi of left lower limb, including hip: Secondary | ICD-10-CM | POA: Diagnosis not present

## 2021-02-04 DIAGNOSIS — L820 Inflamed seborrheic keratosis: Secondary | ICD-10-CM | POA: Diagnosis not present

## 2021-03-11 DIAGNOSIS — H5203 Hypermetropia, bilateral: Secondary | ICD-10-CM | POA: Diagnosis not present

## 2021-03-25 DIAGNOSIS — E1140 Type 2 diabetes mellitus with diabetic neuropathy, unspecified: Secondary | ICD-10-CM | POA: Diagnosis not present

## 2021-03-25 DIAGNOSIS — T466X5A Adverse effect of antihyperlipidemic and antiarteriosclerotic drugs, initial encounter: Secondary | ICD-10-CM | POA: Diagnosis not present

## 2021-03-25 DIAGNOSIS — G720 Drug-induced myopathy: Secondary | ICD-10-CM | POA: Diagnosis not present

## 2021-03-25 DIAGNOSIS — I10 Essential (primary) hypertension: Secondary | ICD-10-CM | POA: Diagnosis not present

## 2021-03-25 DIAGNOSIS — Z6824 Body mass index (BMI) 24.0-24.9, adult: Secondary | ICD-10-CM | POA: Diagnosis not present

## 2021-03-25 DIAGNOSIS — N400 Enlarged prostate without lower urinary tract symptoms: Secondary | ICD-10-CM | POA: Diagnosis not present

## 2021-06-25 DIAGNOSIS — Z6825 Body mass index (BMI) 25.0-25.9, adult: Secondary | ICD-10-CM | POA: Diagnosis not present

## 2021-06-25 DIAGNOSIS — I10 Essential (primary) hypertension: Secondary | ICD-10-CM | POA: Diagnosis not present

## 2021-06-25 DIAGNOSIS — N400 Enlarged prostate without lower urinary tract symptoms: Secondary | ICD-10-CM | POA: Diagnosis not present

## 2021-06-25 DIAGNOSIS — E663 Overweight: Secondary | ICD-10-CM | POA: Diagnosis not present

## 2021-06-25 DIAGNOSIS — E1140 Type 2 diabetes mellitus with diabetic neuropathy, unspecified: Secondary | ICD-10-CM | POA: Diagnosis not present

## 2021-07-16 DIAGNOSIS — G4733 Obstructive sleep apnea (adult) (pediatric): Secondary | ICD-10-CM | POA: Diagnosis not present

## 2021-08-11 ENCOUNTER — Other Ambulatory Visit: Payer: Self-pay | Admitting: Cardiology

## 2021-08-16 DIAGNOSIS — G4733 Obstructive sleep apnea (adult) (pediatric): Secondary | ICD-10-CM | POA: Diagnosis not present

## 2021-09-14 DIAGNOSIS — R972 Elevated prostate specific antigen [PSA]: Secondary | ICD-10-CM | POA: Diagnosis not present

## 2021-09-16 DIAGNOSIS — G4733 Obstructive sleep apnea (adult) (pediatric): Secondary | ICD-10-CM | POA: Diagnosis not present

## 2021-09-21 DIAGNOSIS — N401 Enlarged prostate with lower urinary tract symptoms: Secondary | ICD-10-CM | POA: Diagnosis not present

## 2021-09-21 DIAGNOSIS — R972 Elevated prostate specific antigen [PSA]: Secondary | ICD-10-CM | POA: Diagnosis not present

## 2021-09-21 DIAGNOSIS — N5201 Erectile dysfunction due to arterial insufficiency: Secondary | ICD-10-CM | POA: Diagnosis not present

## 2021-09-21 DIAGNOSIS — R3915 Urgency of urination: Secondary | ICD-10-CM | POA: Diagnosis not present

## 2021-09-27 DIAGNOSIS — E1140 Type 2 diabetes mellitus with diabetic neuropathy, unspecified: Secondary | ICD-10-CM | POA: Diagnosis not present

## 2021-09-27 DIAGNOSIS — N400 Enlarged prostate without lower urinary tract symptoms: Secondary | ICD-10-CM | POA: Diagnosis not present

## 2021-09-27 DIAGNOSIS — I10 Essential (primary) hypertension: Secondary | ICD-10-CM | POA: Diagnosis not present

## 2021-09-27 DIAGNOSIS — Z6825 Body mass index (BMI) 25.0-25.9, adult: Secondary | ICD-10-CM | POA: Diagnosis not present

## 2021-11-13 ENCOUNTER — Other Ambulatory Visit: Payer: Self-pay | Admitting: Cardiology

## 2021-11-13 DIAGNOSIS — I10 Essential (primary) hypertension: Secondary | ICD-10-CM

## 2021-12-09 ENCOUNTER — Other Ambulatory Visit: Payer: Self-pay | Admitting: Cardiology

## 2021-12-30 DIAGNOSIS — I10 Essential (primary) hypertension: Secondary | ICD-10-CM | POA: Diagnosis not present

## 2021-12-30 DIAGNOSIS — E1140 Type 2 diabetes mellitus with diabetic neuropathy, unspecified: Secondary | ICD-10-CM | POA: Diagnosis not present

## 2021-12-30 DIAGNOSIS — E559 Vitamin D deficiency, unspecified: Secondary | ICD-10-CM | POA: Diagnosis not present

## 2021-12-30 DIAGNOSIS — E663 Overweight: Secondary | ICD-10-CM | POA: Diagnosis not present

## 2021-12-30 DIAGNOSIS — Z125 Encounter for screening for malignant neoplasm of prostate: Secondary | ICD-10-CM | POA: Diagnosis not present

## 2021-12-30 DIAGNOSIS — N400 Enlarged prostate without lower urinary tract symptoms: Secondary | ICD-10-CM | POA: Diagnosis not present

## 2021-12-30 DIAGNOSIS — Z0001 Encounter for general adult medical examination with abnormal findings: Secondary | ICD-10-CM | POA: Diagnosis not present

## 2021-12-30 DIAGNOSIS — Z1331 Encounter for screening for depression: Secondary | ICD-10-CM | POA: Diagnosis not present

## 2021-12-30 DIAGNOSIS — G720 Drug-induced myopathy: Secondary | ICD-10-CM | POA: Diagnosis not present

## 2021-12-30 DIAGNOSIS — Z6825 Body mass index (BMI) 25.0-25.9, adult: Secondary | ICD-10-CM | POA: Diagnosis not present

## 2022-01-12 DIAGNOSIS — G4733 Obstructive sleep apnea (adult) (pediatric): Secondary | ICD-10-CM | POA: Diagnosis not present

## 2022-02-03 DIAGNOSIS — L237 Allergic contact dermatitis due to plants, except food: Secondary | ICD-10-CM | POA: Diagnosis not present

## 2022-02-09 ENCOUNTER — Other Ambulatory Visit: Payer: Self-pay | Admitting: Cardiology

## 2022-02-11 DIAGNOSIS — G4733 Obstructive sleep apnea (adult) (pediatric): Secondary | ICD-10-CM | POA: Diagnosis not present

## 2022-02-28 DIAGNOSIS — D1801 Hemangioma of skin and subcutaneous tissue: Secondary | ICD-10-CM | POA: Diagnosis not present

## 2022-02-28 DIAGNOSIS — L821 Other seborrheic keratosis: Secondary | ICD-10-CM | POA: Diagnosis not present

## 2022-02-28 DIAGNOSIS — D2272 Melanocytic nevi of left lower limb, including hip: Secondary | ICD-10-CM | POA: Diagnosis not present

## 2022-02-28 DIAGNOSIS — D225 Melanocytic nevi of trunk: Secondary | ICD-10-CM | POA: Diagnosis not present

## 2022-02-28 DIAGNOSIS — L812 Freckles: Secondary | ICD-10-CM | POA: Diagnosis not present

## 2022-03-14 DIAGNOSIS — G4733 Obstructive sleep apnea (adult) (pediatric): Secondary | ICD-10-CM | POA: Diagnosis not present

## 2022-03-14 DIAGNOSIS — R972 Elevated prostate specific antigen [PSA]: Secondary | ICD-10-CM | POA: Diagnosis not present

## 2022-03-21 DIAGNOSIS — N401 Enlarged prostate with lower urinary tract symptoms: Secondary | ICD-10-CM | POA: Diagnosis not present

## 2022-03-21 DIAGNOSIS — R3912 Poor urinary stream: Secondary | ICD-10-CM | POA: Diagnosis not present

## 2022-03-21 DIAGNOSIS — N5201 Erectile dysfunction due to arterial insufficiency: Secondary | ICD-10-CM | POA: Diagnosis not present

## 2022-03-21 DIAGNOSIS — R972 Elevated prostate specific antigen [PSA]: Secondary | ICD-10-CM | POA: Diagnosis not present

## 2022-03-29 ENCOUNTER — Telehealth: Payer: Self-pay | Admitting: *Deleted

## 2022-03-29 ENCOUNTER — Encounter: Payer: Self-pay | Admitting: *Deleted

## 2022-03-29 NOTE — Patient Outreach (Signed)
  Care Coordination   Initial Visit Note   03/29/2022 Name: Eric Ball MRN: [id number] DOB: Apr 09, 1951  Eric Ball is a 71 y.o. year old male who sees Redmond School, MD for primary care. I spoke with  Eric Ball by phone today.  What matters to the patients health and wellness today?  Being able to get prescription assistance for Praluent. Currently $140 per month and managing blood sugar.    Goals Addressed             This Visit's Progress    Care Coordination Services       Care Coordination Interventions: Evaluation of current treatment plan related to DM & HLD and patient's adherence to plan as established by provider Reviewed medications with patient and discussed cost of Praluent and inability to obtain assistance in the past due to limited funding Pharmacy referral for prescription assistance for Praluent sent via secure email to [email or web address] Discussed plans with patient for ongoing care management follow up and provided patient with direct contact information for care management team Assessed social determinant of health barriers Assessed mobility and ability to perform ADLs Assessed family/Social support Provided patient/caregiver with verbal information on [place] [phone]) Telephone appointment scheduled with Eric David, RN Care Coordinator [phone]) for 05/20/22 at 9:00am         SDOH assessments and interventions completed:  Yes  SDOH Interventions Today    Flowsheet Row Most Recent Value  SDOH Interventions   Housing Interventions Intervention Not Indicated  Transportation Interventions Intervention Not Indicated  Utilities Interventions Intervention Not Indicated  Financial Strain Interventions Intervention Not Indicated        Care Coordination Interventions Activated:  Yes  Care Coordination Interventions:  Yes, provided   Follow up plan: Follow up call scheduled for 05/20/22 at 9:00  with Eric David, RN Care Coordinator    Encounter Outcome:  Pt. Visit Completed   Eric Ball, BSN, RN-BC [place] / [place] Direct Dial: [phone]

## 2022-03-31 ENCOUNTER — Telehealth: Payer: Self-pay | Admitting: *Deleted

## 2022-03-31 NOTE — Patient Outreach (Signed)
  Care Coordination   Follow Up Visit Note   03/31/2022 Name: Eric Ball MRN: [id number] DOB: September 15, 1950  Eric Ball is a 71 y.o. year old male who sees Redmond School, MD for primary care. I  received an email response from [place] regarding appt to discuss prescription assistance.   What matters to the patients health and wellness today?  Prescription assistance for Praluent    Goals Addressed             This Visit's Progress    Care Coordination Services       Care Coordination Interventions: Evaluation of current treatment plan related to DM & HLD and patient's adherence to plan as established by provider Reviewed medications with patient and discussed cost of Praluent and inability to obtain assistance in the past due to limited funding Pharmacy referral for prescription assistance for Praluent sent via secure email to [email or web address] Discussed plans with patient for ongoing care management follow up and provided patient with direct contact information for care management team Assessed social determinant of health barriers Assessed mobility and ability to perform ADLs Assessed family/Social support Provided patient/caregiver with verbal information on [place] [phone]) Telephone appointment scheduled with Valente David, RN Care Coordinator [phone]) for 05/20/22 at 9:00am Received email from [facility] Referral Department. Patient is scheduled with Pharmacy to discuss Rx assistance on 04/26/22 at 8:30.          SDOH assessments and interventions completed:  No  Care Coordination Interventions Activated:  Yes  Care Coordination Interventions:  Yes, provided   Follow up plan: Follow up call scheduled for 05/20/22 with Valente David, RN Care Coordinator [phone])   Encounter Outcome:  Pt. Visit Completed   Chong Sicilian, BSN, RN-BC [place] / [place] Direct Dial:  [phone]

## 2022-04-01 NOTE — Progress Notes (Signed)
HPI: FU CAD; he has had PCI of his circumflex and right coronary artery. Echo December 2018 showed normal LV systolic function. Patient admitted with non-ST elevation myocardial infarction February 2021. He was found to have severe proximal LAD stenosis and was treated with drug-eluting stent. Previous stents in the circumflex and right coronary artery were patent. Ejection fraction 55%. Since last seen, the patient denies any dyspnea on exertion, orthopnea, PND, pedal edema, palpitations, syncope or chest pain.   Current Outpatient Medications  Medication Sig Dispense Refill   ASPIRIN LOW DOSE 81 MG EC tablet TAKE 1 TABLET BY MOUTH EVERY DAY 90 tablet 3   azelastine (ASTELIN) 0.1 % nasal spray Place 2 sprays into both nostrils 2 (two) times daily as needed for allergies.      carvedilol (COREG) 12.5 MG tablet TAKE 1 TABLET BY MOUTH TWICE A DAY WITH MEALS 180 tablet 1   Cholecalciferol (VITAMIN D3 PO) Take 1 capsule by mouth daily.     fluticasone (FLONASE) 50 MCG/ACT nasal spray Place 1 spray into both nostrils daily as needed for allergies.     glimepiride (AMARYL) 2 MG tablet Take 2 mg by mouth daily as needed (high blood sugar).     ketoconazole (NIZORAL) 2 % cream Apply 1 application topically daily as needed for irritation.     loperamide (IMODIUM) 2 MG capsule Take 2 mg by mouth as needed for diarrhea or loose stools.     metFORMIN (GLUCOPHAGE) 1000 MG tablet Take 1 tablet (1,000 mg total) by mouth 2 (two) times daily with a meal. Resume metformin on 09/12/19. (Patient taking differently: Take 500 mg by mouth 2 (two) times daily with a meal.) 180 tablet 3   Multiple Vitamins-Minerals (ZINC PO) Take 1 tablet by mouth daily.     nitroGLYCERIN (NITROSTAT) 0.4 MG SL tablet Place 1 tablet (0.4 mg total) under the tongue every 5 (five) minutes as needed for chest pain. 25 tablet 3   Omega-3 Fatty Acids (FISH OIL) 1000 MG CAPS Take 1 capsule by mouth daily at 12 noon.     pantoprazole  (PROTONIX) 40 MG tablet Take 1 tablet (40 mg total) by mouth daily. 90 tablet 3   PRALUENT 150 MG/ML SOAJ INJECT 150 MG INTO THE SKIN EVERY 14 (FOURTEEN) DAYS. 6 mL 3   QUERCETIN PO Take 1 tablet by mouth daily.     silodosin (RAPAFLO) 8 MG CAPS capsule Take 8 mg by mouth at bedtime.     valsartan (DIOVAN) 320 MG tablet TAKE 1 TABLET BY MOUTH EVERY DAY 90 tablet 3   No current facility-administered medications for this visit.     Past Medical History:  Diagnosis Date   Complication of anesthesia    "I'm slow to come out"   Coronary artery disease 2015   PTCA with DES to Cx; PCI  BMS 2004; LHC 08/2019 DES to LAD   GERD (gastroesophageal reflux disease)    H/O hiatal hernia    History of myocardial infarction    with a stent placed at that time   Hypercholesterolemia    with intolerant ot many of the cholesterol medicaitons   Hypertension    essentai hypertension   Ischemic heart disease    Myocardial infarction [facility]) 2004   with a stent placed at that time   Nephrosis  1960's   Sleep apnea    "suppose to wear mask; I don't wear it enough" (11/15/2013)   Type II diabetes mellitus ([place])  Past Surgical History:  Procedure Laterality Date   BACK SURGERY     COLONOSCOPY  10/07/2011   [name]:multiple colonic polyps removed/colonic diverticulosis, tubular adenoma   COLONOSCOPY N/A 12/08/2014   Procedure: COLONOSCOPY;  Surgeon: [name], MD;  [facility];  Service: Endoscopy;  Laterality: N/A;  [place] WITH STENT PLACEMENT  09/25/2002   EF-approximately 45% / stent placement to the mid arteriovenous circumflex with reduction of 99% narrowing to 0% with placement of a 3.0 x 16 mm Express II stent with improvement of TIMI grade 2 to TIMI grade 3 flow.   CORONARY ANGIOPLASTY WITH STENT PLACEMENT  11/15/2013   "1"   CORONARY STENT INTERVENTION N/A 07/03/2017   Procedure: CORONARY STENT INTERVENTION;  Surgeon: Nelva Bush, MD;  Location: [place] CV LAB;  Service: Cardiovascular;  Laterality: N/A;   CORONARY STENT INTERVENTION N/A 09/09/2019   Procedure: CORONARY STENT INTERVENTION;  Surgeon: Sherren Mocha, MD;  Location: [place] CV LAB;  Service: Cardiovascular;  Laterality: N/A;   EYE SURGERY     LEFT HEART CATH AND CORONARY ANGIOGRAPHY N/A 09/09/2019   Procedure: LEFT HEART CATH AND CORONARY ANGIOGRAPHY;  Surgeon: Sherren Mocha, MD;  Location: [place] CV LAB;  Service: Cardiovascular;  Laterality: N/A;   LEFT HEART CATHETERIZATION WITH CORONARY ANGIOGRAM N/A 11/15/2013   Procedure: LEFT HEART CATHETERIZATION WITH CORONARY ANGIOGRAM;  Surgeon: Burnell Blanks, MD;  Location: [facility] CATH LAB;  Service: Cardiovascular;  Laterality: N/A;   LUMBAR [place]   NASAL SEPTUM SURGERY  02/20/2001   Septal reconstruction and turbinate reduction   PROSTATE BIOPSY N/A 06/04/2020   Procedure: SATURATION BIOPSY TRANSRECTAL ULTRASONIC PROSTATE (TUBP);  Surgeon: Ardis Hughs, MD;  Location: [facility] ORS;  Service: Urology;  Laterality: N/A;   RIGHT/LEFT HEART CATH AND CORONARY ANGIOGRAPHY N/A 07/03/2017   Procedure: RIGHT/LEFT HEART CATH AND CORONARY ANGIOGRAPHY;  Surgeon: Nelva Bush, MD;  Location: [place] CV LAB;  Service: Cardiovascular;  Laterality: N/A;    Social History   Socioeconomic History   Marital status: Married    Spouse name: Not on file   Number of children: Not on file   Years of education: Not on file   Highest education level: Not on file  Occupational History   Not on file  Tobacco Use   Smoking status: Never   Smokeless tobacco: Never   Tobacco comments:    Never smoker  Vaping Use   Vaping Use: Never used  Substance and Sexual Activity   Alcohol use: No    Alcohol/week: 0.0 standard drinks of alcohol   Drug use: No   Sexual activity: Yes  Other Topics Concern   Not on file  Social History Narrative   Not on file   Social Determinants of Health   Financial Resource  Strain: Low Risk  (03/29/2022)   Overall Financial Resource Strain (CARDIA)    Difficulty of Paying Living Expenses: Not hard at all  Food Insecurity: Not on file  Transportation Needs: No Transportation Needs (03/29/2022)   PRAPARE - Hydrologist (Medical): No    Lack of Transportation (Non-Medical): No  Physical Activity: Not on file  Stress: Not on file  Social Connections: Not on file  Intimate Partner Violence: Not on file    Family History  Problem Relation Age of Onset   Coronary artery disease Father        with multiple angioplasties   Healthy  Sister    Hypertension Other        runs in the family   Colon cancer Neg Hx     ROS: no fevers or chills, productive cough, hemoptysis, dysphasia, odynophagia, melena, hematochezia, dysuria, hematuria, rash, seizure activity, orthopnea, PND, pedal edema, claudication. Remaining systems are negative.  Physical Exam: Well-developed well-nourished in no acute distress.  Skin is warm and dry.  HEENT is normal.  Neck is supple.  Chest is clear to auscultation with normal expansion.  Cardiovascular exam is regular rate and rhythm.  Abdominal exam nontender or distended. No masses palpated. Extremities show no edema. neuro grossly intact  ECG-normal sinus rhythm at a rate of 68, no ST changes.  Personally reviewed  A/P  1 coronary artery disease-patient doing well with no chest pain.  Plan to continue medical therapy.  Continue aspirin.  He is intolerant to statins.  2 hyperlipidemia-patient is intolerant to statins.  We will continue Praluent.  Price has increased.  I will ask him to see one of our pharmacists to see which option would be least expensive (Praluent, Repatha or inclisiran).  I will have most recent lipids and liver forwarded to [place] from primary care.  3 hypertension-blood pressure controlled.  Continue present medications.  [name], MD

## 2022-04-11 ENCOUNTER — Ambulatory Visit: Payer: Medicare HMO | Attending: Cardiology | Admitting: Cardiology

## 2022-04-11 ENCOUNTER — Encounter: Payer: Self-pay | Admitting: Cardiology

## 2022-04-11 VITALS — BP 134/78 | HR 68 | Ht 69.0 in | Wt 176.6 lb

## 2022-04-11 DIAGNOSIS — E7800 Pure hypercholesterolemia, unspecified: Secondary | ICD-10-CM

## 2022-04-11 DIAGNOSIS — I2510 Atherosclerotic heart disease of native coronary artery without angina pectoris: Secondary | ICD-10-CM

## 2022-04-11 DIAGNOSIS — I10 Essential (primary) hypertension: Secondary | ICD-10-CM | POA: Diagnosis not present

## 2022-04-11 NOTE — Patient Instructions (Signed)
  Follow-Up: At [facility], you and your health needs are our priority.  As part of our continuing mission to provide you with exceptional heart care, we have created designated Provider Care Teams.  These Care Teams include your primary Cardiologist (physician) and Advanced Practice Providers (APPs -  Physician Assistants and Nurse Practitioners) who all work together to provide you with the care you need, when you need it.  We recommend signing up for the patient portal called "MyChart".  Sign up information is provided on this After Visit Summary.  MyChart is used to connect with patients for Virtual Visits (Telemedicine).  Patients are able to view lab/test results, encounter notes, upcoming appointments, etc.  Non-urgent messages can be sent to your provider as well.   To learn more about what you can do with MyChart, go to [email or web address].    Your next appointment:   12 month(s)  The format for your next appointment:   In Person  Provider:   Brian Crenshaw, MD   

## 2022-04-20 ENCOUNTER — Encounter: Payer: Self-pay | Admitting: *Deleted

## 2022-04-20 DIAGNOSIS — E7800 Pure hypercholesterolemia, unspecified: Secondary | ICD-10-CM

## 2022-04-20 DIAGNOSIS — I2510 Atherosclerotic heart disease of native coronary artery without angina pectoris: Secondary | ICD-10-CM

## 2022-04-20 MED ORDER — EZETIMIBE 10 MG PO TABS: 10.0000 mg | ORAL_TABLET | Freq: Every day | ORAL | 3 refills | Status: DC

## 2022-04-27 DIAGNOSIS — G720 Drug-induced myopathy: Secondary | ICD-10-CM | POA: Diagnosis not present

## 2022-04-27 DIAGNOSIS — I10 Essential (primary) hypertension: Secondary | ICD-10-CM | POA: Diagnosis not present

## 2022-04-27 DIAGNOSIS — E663 Overweight: Secondary | ICD-10-CM | POA: Diagnosis not present

## 2022-04-27 DIAGNOSIS — Z6826 Body mass index (BMI) 26.0-26.9, adult: Secondary | ICD-10-CM | POA: Diagnosis not present

## 2022-04-27 DIAGNOSIS — E1165 Type 2 diabetes mellitus with hyperglycemia: Secondary | ICD-10-CM | POA: Diagnosis not present

## 2022-04-27 DIAGNOSIS — N400 Enlarged prostate without lower urinary tract symptoms: Secondary | ICD-10-CM | POA: Diagnosis not present

## 2022-05-10 ENCOUNTER — Encounter: Payer: Self-pay | Admitting: Pharmacist

## 2022-05-10 ENCOUNTER — Ambulatory Visit: Payer: Medicare HMO | Attending: Cardiology | Admitting: Pharmacist

## 2022-05-10 DIAGNOSIS — T466X5D Adverse effect of antihyperlipidemic and antiarteriosclerotic drugs, subsequent encounter: Secondary | ICD-10-CM

## 2022-05-10 DIAGNOSIS — M791 Myalgia, unspecified site: Secondary | ICD-10-CM

## 2022-05-10 DIAGNOSIS — T466X5A Adverse effect of antihyperlipidemic and antiarteriosclerotic drugs, initial encounter: Secondary | ICD-10-CM

## 2022-05-10 DIAGNOSIS — I2510 Atherosclerotic heart disease of native coronary artery without angina pectoris: Secondary | ICD-10-CM

## 2022-05-10 DIAGNOSIS — I2583 Coronary atherosclerosis due to lipid rich plaque: Secondary | ICD-10-CM

## 2022-05-10 DIAGNOSIS — I214 Non-ST elevation (NSTEMI) myocardial infarction: Secondary | ICD-10-CM

## 2022-05-10 NOTE — Progress Notes (Signed)
Patient ID: Eric Ball                 DOB: [birth year]                    MRN: [id number]  HPI: Eric Ball is a 71 y.o. male patient referred to lipid clinic by Dr Stanford Breed. PMH is significant for CAD, angina, NSTEMI, T2DM and statin intolerance.   Previously on patient assistance for Praluent but it expired. Reports copay has become expensive.  Current Medications: Praluent '150mg'$   Intolerances:  Atorvastatin Rosuvastatin  Risk Factors:  CAD NSTEMI T2DM  LDL goal: <55   Past Medical History:  Diagnosis Date   Complication of anesthesia    "I'm slow to come out"   Coronary artery disease 2015   PTCA with DES to Cx; PCI  BMS 2004; LHC 08/2019 DES to LAD   GERD (gastroesophageal reflux disease)    H/O hiatal hernia    History of myocardial infarction    with a stent placed at that time   Hypercholesterolemia    with intolerant ot many of the cholesterol medicaitons   Hypertension    essentai hypertension   Ischemic heart disease    Myocardial infarction [facility]) 2004   with a stent placed at that time   Nephrosis  1960's   Sleep apnea    "suppose to wear mask; I don't wear it enough" (11/15/2013)   Type II diabetes mellitus ([facility])     Current Outpatient Medications on File Prior to Visit  Medication Sig Dispense Refill   ASPIRIN LOW DOSE 81 MG EC tablet TAKE 1 TABLET BY MOUTH EVERY DAY 90 tablet 3   azelastine (ASTELIN) 0.1 % nasal spray Place 2 sprays into both nostrils 2 (two) times daily as needed for allergies.      carvedilol (COREG) 12.5 MG tablet TAKE 1 TABLET BY MOUTH TWICE A DAY WITH MEALS 180 tablet 1   Cholecalciferol (VITAMIN D3 PO) Take 1 capsule by mouth daily.     ezetimibe (ZETIA) 10 MG tablet Take 1 tablet (10 mg total) by mouth daily. 90 tablet 3   fluticasone (FLONASE) 50 MCG/ACT nasal spray Place 1 spray into both nostrils daily as needed for allergies.     glimepiride (AMARYL) 4 MG tablet Take 4 mg by mouth daily.     ketoconazole  (NIZORAL) 2 % cream Apply 1 application topically daily as needed for irritation.     loperamide (IMODIUM) 2 MG capsule Take 2 mg by mouth as needed for diarrhea or loose stools.     metFORMIN (GLUCOPHAGE) 1000 MG tablet Take 1 tablet (1,000 mg total) by mouth 2 (two) times daily with a meal. Resume metformin on 09/12/19. (Patient taking differently: Take 500 mg by mouth 2 (two) times daily with a meal.) 180 tablet 3   Multiple Vitamins-Minerals (ZINC PO) Take 1 tablet by mouth daily.     nitroGLYCERIN (NITROSTAT) 0.4 MG SL tablet Place 1 tablet (0.4 mg total) under the tongue every 5 (five) minutes as needed for chest pain. 25 tablet 3   Omega-3 Fatty Acids (FISH OIL) 1000 MG CAPS Take 1 capsule by mouth daily at 12 noon.     pantoprazole (PROTONIX) 40 MG tablet Take 1 tablet (40 mg total) by mouth daily. 90 tablet 3   PRALUENT 150 MG/ML SOAJ INJECT 150 MG INTO THE SKIN EVERY 14 (FOURTEEN) DAYS. 6 mL 3   QUERCETIN PO Take 1 tablet by mouth  daily.     silodosin (RAPAFLO) 8 MG CAPS capsule Take 8 mg by mouth at bedtime.     valsartan (DIOVAN) 320 MG tablet TAKE 1 TABLET BY MOUTH EVERY DAY 90 tablet 3   No current facility-administered medications on file prior to visit.    Allergies  Allergen Reactions   Statins Other (See Comments)    Joint pain     Assessment/Plan:  1. Hyperlipidemia - Patient tolerates Praluent well but is becoming cost prohibitive. Completed patient assistance form online from [place]. Completed prescription and had Dr Stanford Breed sign. Faxed to [employer].  Follow up as needed.  Karren Cobble, PharmD, BCACP, [place], [place], [place], [place], [address] Phone: [phone], Fax: [phone]

## 2022-05-20 ENCOUNTER — Ambulatory Visit: Payer: Self-pay | Admitting: *Deleted

## 2022-05-20 ENCOUNTER — Encounter: Payer: Self-pay | Admitting: *Deleted

## 2022-05-20 NOTE — Patient Outreach (Signed)
  Care Coordination   Initial Visit Note   05/20/2022 Name: Eric Ball MRN: [id number] DOB: 02-16-1951  Eric Ball is a 71 y.o. year old male who sees Redmond School, MD for primary care. I spoke with  [name] Comment by phone today.  What matters to the patients health and wellness today?  Get Praluent cheaper    Goals Addressed             This Visit's Progress    Care Coordination Services       Care Coordination Interventions: Evaluation of current treatment plan related to DM & HLD and patient's adherence to plan as established by provider Reviewed medications with patient and discussed cost of Praluent. Most other medications are well covered by insurance and have a low copay or no copay Discussed plans with patient for ongoing care management follow up and provided patient with direct contact information for care management team Assessed social determinant of health barriers Discussed prior referral to [facility] Pharmacist with [facility]. He met with pharmacist and they assisted him in applying for Praluent assistance. He did qualify based on income but not based on the amount he had paid out-of-pocket for prescriptions Provided verbal and written information (via email) regarding Medicare Extra Help RNCC to follow-up in 2 months  Encouraged patient to reach out sooner if needed         SDOH assessments and interventions completed:  Yes  SDOH Interventions Today    Flowsheet Row Most Recent Value  SDOH Interventions   Housing Interventions Intervention Not Indicated  Transportation Interventions Intervention Not Indicated  Utilities Interventions Intervention Not Indicated  Financial Strain Interventions Intervention Not Indicated        Care Coordination Interventions Activated:  Yes  Care Coordination Interventions:  Yes, provided   Follow up plan: Follow up call scheduled for 07/15/22 at 11:30    Encounter Outcome:  Pt. Visit Completed    Chong Sicilian, BSN, RN-BC RN Care Coordinator [place] Direct Dial: [phone] Main #: [phone]

## 2022-05-24 DIAGNOSIS — I10 Essential (primary) hypertension: Secondary | ICD-10-CM | POA: Diagnosis not present

## 2022-05-24 DIAGNOSIS — E1140 Type 2 diabetes mellitus with diabetic neuropathy, unspecified: Secondary | ICD-10-CM | POA: Diagnosis not present

## 2022-07-15 ENCOUNTER — Ambulatory Visit: Payer: Self-pay | Admitting: *Deleted

## 2022-07-15 ENCOUNTER — Encounter: Payer: Self-pay | Admitting: *Deleted

## 2022-07-15 NOTE — Patient Outreach (Signed)
  Care Coordination   Follow Up Visit Note   07/15/2022 Name: CESARE SUMLIN MRN: [id number] DOB: [birth year]  ISAIAHS CHANCY is a 71 y.o. year old male who sees Redmond School, MD for primary care. I visited  KAYLE CORREA in their home today.  What matters to the patients health and wellness today? Paying for medication    Goals Addressed             This Visit's Progress    Care Coordination Services       Care Coordination Interventions: Evaluation of current treatment plan related to DM & HLD and patient's adherence to plan as established by provider Reviewed medications with patient and discussed that Praluent won't be on his insurance company's formulary for 2024 and they recommended switching to [place] social determinant of health barriers Patient has worked with Nicholas Lose, PharmD at [facility] in the past and didn't qualify for Praluent assistance. I will send Nicki Reaper a secure email advising that his ins is recommending that he switch to [place] in Jan and see if they can start the process for med assistance.  Discussed upcoming appt with Dr Gerarda Fraction in Jan 2024 per patient Assessed for additional resource or care coordination needs. None at this time        SDOH assessments and interventions completed:  Yes  SDOH Interventions Today    Flowsheet Row Most Recent Value  SDOH Interventions   Housing Interventions Intervention Not Indicated  Transportation Interventions Intervention Not Indicated  Financial Strain Interventions Intervention Not Indicated        Care Coordination Interventions:  Yes, provided   Follow up plan: No further intervention required.   Encounter Outcome:  Pt. Visit Completed   Chong Sicilian, BSN, RN-BC RN Care Coordinator [place] Direct Dial: [phone] Main #: [phone]

## 2022-08-01 DIAGNOSIS — E663 Overweight: Secondary | ICD-10-CM | POA: Diagnosis not present

## 2022-08-01 DIAGNOSIS — I10 Essential (primary) hypertension: Secondary | ICD-10-CM | POA: Diagnosis not present

## 2022-08-01 DIAGNOSIS — E1140 Type 2 diabetes mellitus with diabetic neuropathy, unspecified: Secondary | ICD-10-CM | POA: Diagnosis not present

## 2022-08-01 DIAGNOSIS — E1165 Type 2 diabetes mellitus with hyperglycemia: Secondary | ICD-10-CM | POA: Diagnosis not present

## 2022-08-01 DIAGNOSIS — Z6826 Body mass index (BMI) 26.0-26.9, adult: Secondary | ICD-10-CM | POA: Diagnosis not present

## 2022-08-01 DIAGNOSIS — N400 Enlarged prostate without lower urinary tract symptoms: Secondary | ICD-10-CM | POA: Diagnosis not present

## 2022-08-01 DIAGNOSIS — J329 Chronic sinusitis, unspecified: Secondary | ICD-10-CM | POA: Diagnosis not present

## 2022-08-01 DIAGNOSIS — G720 Drug-induced myopathy: Secondary | ICD-10-CM | POA: Diagnosis not present

## 2022-08-01 DIAGNOSIS — K219 Gastro-esophageal reflux disease without esophagitis: Secondary | ICD-10-CM | POA: Diagnosis not present

## 2022-08-05 ENCOUNTER — Other Ambulatory Visit: Payer: Self-pay | Admitting: Cardiology

## 2022-08-11 DIAGNOSIS — Z6826 Body mass index (BMI) 26.0-26.9, adult: Secondary | ICD-10-CM | POA: Diagnosis not present

## 2022-08-11 DIAGNOSIS — I10 Essential (primary) hypertension: Secondary | ICD-10-CM | POA: Diagnosis not present

## 2022-08-11 DIAGNOSIS — E1140 Type 2 diabetes mellitus with diabetic neuropathy, unspecified: Secondary | ICD-10-CM | POA: Diagnosis not present

## 2022-08-11 DIAGNOSIS — T466X5S Adverse effect of antihyperlipidemic and antiarteriosclerotic drugs, sequela: Secondary | ICD-10-CM | POA: Diagnosis not present

## 2022-08-11 DIAGNOSIS — G720 Drug-induced myopathy: Secondary | ICD-10-CM | POA: Diagnosis not present

## 2022-08-11 DIAGNOSIS — J329 Chronic sinusitis, unspecified: Secondary | ICD-10-CM | POA: Diagnosis not present

## 2022-08-11 DIAGNOSIS — K219 Gastro-esophageal reflux disease without esophagitis: Secondary | ICD-10-CM | POA: Diagnosis not present

## 2022-08-11 DIAGNOSIS — E1165 Type 2 diabetes mellitus with hyperglycemia: Secondary | ICD-10-CM | POA: Diagnosis not present

## 2022-08-11 DIAGNOSIS — E663 Overweight: Secondary | ICD-10-CM | POA: Diagnosis not present

## 2022-08-11 DIAGNOSIS — J209 Acute bronchitis, unspecified: Secondary | ICD-10-CM | POA: Diagnosis not present

## 2022-09-05 DIAGNOSIS — R0982 Postnasal drip: Secondary | ICD-10-CM | POA: Diagnosis not present

## 2022-09-05 DIAGNOSIS — K219 Gastro-esophageal reflux disease without esophagitis: Secondary | ICD-10-CM | POA: Diagnosis not present

## 2022-09-05 DIAGNOSIS — R053 Chronic cough: Secondary | ICD-10-CM | POA: Diagnosis not present

## 2022-09-07 DIAGNOSIS — J342 Deviated nasal septum: Secondary | ICD-10-CM | POA: Diagnosis not present

## 2022-09-07 DIAGNOSIS — R0982 Postnasal drip: Secondary | ICD-10-CM | POA: Diagnosis not present

## 2022-09-16 DIAGNOSIS — R972 Elevated prostate specific antigen [PSA]: Secondary | ICD-10-CM | POA: Diagnosis not present

## 2022-09-22 DIAGNOSIS — H5203 Hypermetropia, bilateral: Secondary | ICD-10-CM | POA: Diagnosis not present

## 2022-09-23 DIAGNOSIS — R351 Nocturia: Secondary | ICD-10-CM | POA: Diagnosis not present

## 2022-09-23 DIAGNOSIS — R972 Elevated prostate specific antigen [PSA]: Secondary | ICD-10-CM | POA: Diagnosis not present

## 2022-09-23 DIAGNOSIS — N5201 Erectile dysfunction due to arterial insufficiency: Secondary | ICD-10-CM | POA: Diagnosis not present

## 2022-09-23 DIAGNOSIS — N401 Enlarged prostate with lower urinary tract symptoms: Secondary | ICD-10-CM | POA: Diagnosis not present

## 2022-10-28 ENCOUNTER — Telehealth: Payer: Self-pay | Admitting: *Deleted

## 2022-10-28 NOTE — Progress Notes (Signed)
  Care Coordination   Note   10/28/2022 Name: HAIDER CARRERA MRN: [id number] DOB: August 23, 1950  OLUJIMI ALMEYDA is a 72 y.o. year old male who sees Elfredia Nevins, MD for primary care. I reached out to Windy Canny by phone today to offer care coordination services.  Mr. Byars was given information about Care Coordination services today including:   The Care Coordination services include support from the care team which includes your Nurse Coordinator, Clinical Social Worker, or Pharmacist.  The Care Coordination team is here to help remove barriers to the health concerns and goals most important to you. Care Coordination services are voluntary, and the patient may decline or stop services at any time by request to their care team member.   Care Coordination Consent Status: Patient agreed to services and verbal consent obtained.   Follow up plan:  Telephone appointment with care coordination team member scheduled for:  11/21/22  Encounter Outcome:  Pt. Scheduled  [facility] Coordination Care Guide  Direct Dial: [phone]

## 2022-11-04 DIAGNOSIS — E1165 Type 2 diabetes mellitus with hyperglycemia: Secondary | ICD-10-CM | POA: Diagnosis not present

## 2022-11-04 DIAGNOSIS — E1140 Type 2 diabetes mellitus with diabetic neuropathy, unspecified: Secondary | ICD-10-CM | POA: Diagnosis not present

## 2022-11-04 DIAGNOSIS — K219 Gastro-esophageal reflux disease without esophagitis: Secondary | ICD-10-CM | POA: Diagnosis not present

## 2022-11-04 DIAGNOSIS — E663 Overweight: Secondary | ICD-10-CM | POA: Diagnosis not present

## 2022-11-04 DIAGNOSIS — G720 Drug-induced myopathy: Secondary | ICD-10-CM | POA: Diagnosis not present

## 2022-11-04 DIAGNOSIS — J329 Chronic sinusitis, unspecified: Secondary | ICD-10-CM | POA: Diagnosis not present

## 2022-11-04 DIAGNOSIS — I10 Essential (primary) hypertension: Secondary | ICD-10-CM | POA: Diagnosis not present

## 2022-11-04 DIAGNOSIS — Z6825 Body mass index (BMI) 25.0-25.9, adult: Secondary | ICD-10-CM | POA: Diagnosis not present

## 2022-11-18 ENCOUNTER — Other Ambulatory Visit: Payer: Self-pay | Admitting: Cardiology

## 2022-11-18 DIAGNOSIS — I10 Essential (primary) hypertension: Secondary | ICD-10-CM

## 2022-11-21 ENCOUNTER — Encounter: Payer: Self-pay | Admitting: *Deleted

## 2022-11-21 ENCOUNTER — Ambulatory Visit: Payer: Self-pay | Admitting: *Deleted

## 2022-11-21 NOTE — Patient Outreach (Signed)
Care Coordination   Follow Up Visit Note   11/21/2022 Name: Eric Ball MRN: [id number] DOB: June 20, 1951  EDD REPPERT is a 72 y.o. year old male who sees Elfredia Nevins, MD for primary care. I spoke with  Windy Canny by phone today.  What matters to the patients health and wellness today?  Managing blood sugar and cholesterol    Goals Addressed             This Visit's Progress    COMPLETED: Care Coordination Services       Care Coordination Interventions: Evaluation of current treatment plan related to DM & HLD and patient's adherence to plan as established by provider Reviewed medications with patient and discussed that Praluent won't be on his insurance company's formulary for 2024 and they recommended switching to Repatha  Assessed social determinant of health barriers Patient has worked with Maxwell Caul, PharmD at [facility] in the past and didn't qualify for Praluent assistance. I will send Lorin Picket a secure email advising that his ins is recommending that he switch to Repatha in Jan and see if they can start the process for med assistance.  Discussed upcoming appt with Dr Sherwood Gambler in Jan 2024 per patient Assessed for additional resource or care coordination needs. None at this time     Manage Cholesterol       Care Coordination Goals: Patient will take medications as prescribed Switched from Praluent to Repatha in Jan because of insurance coverage Patient will work with Robbie Lis PharmD for prescription assistance if needed $140 for 3 month supply. Not cost prohibitive at present. No assistance was available when they last checked Patient will follow a heart healthy diet Patient will remain active with a goal of 150 minutes per week Patient will keep all medical appointments Patient will reach out to RN Care Manager with any resource or care coordination needs [phone]      Manage Diabetes       Care Coordination Goals: Patient will follow-up with PCP  and/or endocrinologist every 3 months or as recommended Patient will take medication as prescribed and reach out to provider with any negative side effects Patient will continue to monitor and record blood sugar daily and as needed with glucometer, and will call PCP or endocrinologist with any readings outside of recommended range Patient will take blood sugar log and meter to provider visits for review Patient will follow a modified carbohydrate diet and decrease simple carbohydrates and sugars Patient will increase activity level as tolerated with an ultimate goal of at least 150 minutes of exercise per week Patient will check feet daily for sores, wounds, calluses, etc and will notify provider of any abnormal findings Patient will have yearly eye exams to check for or monitor diabetic retinopathy Patient will reach out to RN Care Coordinator [phone] with any care coordination or resource needs      Manage Fatigue       Care Coordination Goals: Patient will take medication as prescribed Patient will keep all medical appointments Patient will f/u with cardiologist with any new or worsening symptoms Patient will remain active and rest as needed Patient will seek emergency medical attention if needed Patient will reach out to RN Care Coordinator with any resource or care coordination needs [phone]         SDOH assessments and interventions completed:  Yes    SDOH Interventions Today    Flowsheet Row Most Recent Value  SDOH Interventions   Transportation Interventions Intervention  Not Indicated  Financial Strain Interventions Intervention Not Indicated        Care Coordination Interventions:  Yes, provided  Interventions Today    Flowsheet Row Most Recent Value  Chronic Disease   Chronic disease during today's visit Diabetes, Other  [CAD & HLD]  General Interventions   General Interventions Discussed/Reviewed General Interventions Discussed, General Interventions  Reviewed, Labs, Lipid Profile, Durable Medical Equipment (DME), Doctor Visits, Health Screening  Labs Hgb A1c every 3 months, Kidney Function  [Lipid panel]  Doctor Visits Discussed/Reviewed Doctor Visits Discussed, Doctor Visits Reviewed, Specialist, PCP  Health Screening Colonoscopy, Prostate  Durable Medical Equipment (DME) BP Cuff, Glucomoter  PCP/Specialist Visits Compliance with follow-up visit  Exercise Interventions   Exercise Discussed/Reviewed Physical Activity  Physical Activity Discussed/Reviewed Physical Activity Discussed, Physical Activity Reviewed  Education Interventions   Education Provided Provided Education  Provided Verbal Education On Nutrition, Exercise, Blood Sugar Monitoring, Labs, When to see the doctor, Mental Health/Coping with Illness  Labs Reviewed Hgb A1c, Lipid Profile  Nutrition Interventions   Nutrition Discussed/Reviewed Nutrition Discussed, Nutrition Reviewed, Portion sizes  Pharmacy Interventions   Pharmacy Dicussed/Reviewed Medications and their functions, Pharmacy Topics Discussed, Pharmacy Topics Reviewed  Safety Interventions   Safety Discussed/Reviewed Safety Discussed       Follow up plan: Follow up call scheduled for 02/20/23    Encounter Outcome:  Pt. Visit Completed   Demetrios Loll, BSN, RN-BC RN Care Coordinator [facility]  [facility] Direct Dial: [phone] Main #: [phone]

## 2023-02-14 DIAGNOSIS — E559 Vitamin D deficiency, unspecified: Secondary | ICD-10-CM | POA: Diagnosis not present

## 2023-02-14 DIAGNOSIS — I10 Essential (primary) hypertension: Secondary | ICD-10-CM | POA: Diagnosis not present

## 2023-02-14 DIAGNOSIS — Z125 Encounter for screening for malignant neoplasm of prostate: Secondary | ICD-10-CM | POA: Diagnosis not present

## 2023-02-14 DIAGNOSIS — E1165 Type 2 diabetes mellitus with hyperglycemia: Secondary | ICD-10-CM | POA: Diagnosis not present

## 2023-02-14 DIAGNOSIS — D518 Other vitamin B12 deficiency anemias: Secondary | ICD-10-CM | POA: Diagnosis not present

## 2023-02-14 DIAGNOSIS — Z0001 Encounter for general adult medical examination with abnormal findings: Secondary | ICD-10-CM | POA: Diagnosis not present

## 2023-02-14 DIAGNOSIS — E663 Overweight: Secondary | ICD-10-CM | POA: Diagnosis not present

## 2023-02-14 DIAGNOSIS — K219 Gastro-esophageal reflux disease without esophagitis: Secondary | ICD-10-CM | POA: Diagnosis not present

## 2023-02-14 DIAGNOSIS — G720 Drug-induced myopathy: Secondary | ICD-10-CM | POA: Diagnosis not present

## 2023-02-14 DIAGNOSIS — Z1331 Encounter for screening for depression: Secondary | ICD-10-CM | POA: Diagnosis not present

## 2023-02-14 DIAGNOSIS — G9332 Myalgic encephalomyelitis/chronic fatigue syndrome: Secondary | ICD-10-CM | POA: Diagnosis not present

## 2023-02-20 ENCOUNTER — Encounter: Payer: Self-pay | Admitting: *Deleted

## 2023-02-20 ENCOUNTER — Ambulatory Visit: Payer: Self-pay | Admitting: *Deleted

## 2023-02-20 NOTE — Patient Outreach (Signed)
  Care Coordination   Follow Up Visit Note   02/20/2023 Name: Eric Ball MRN: [id number] DOB: November 06, 1950  Eric Ball is a 72 y.o. year old male who sees Elfredia Nevins, MD for primary care. I spoke with  Windy Canny by phone today.  What matters to the patients health and wellness today?  Managing blood sugar    Goals Addressed             This Visit's Progress    Manage Diabetes   Not on track    Care Coordination Goals: Patient will follow-up with PCP every 3 months or as recommended Patient will take medication as prescribed and reach out to provider with any negative side effects Patient will check and record blood sugar daily and as needed with glucometer, and will call PCP with any readings outside of recommended range Patient will take blood sugar log and meter to provider visits for review Patient will follow a modified carbohydrate diet and decrease simple carbohydrates and sugars Patient will review handouts provided on carb counting and portion sizes Patient will increase activity level as tolerated with an ultimate goal of at least 150 minutes of exercise per week Patient will reach out to RN Care Coordinator [phone] with any care coordination or resource needs         SDOH assessments and interventions completed:  Yes SDOH Interventions Today    Flowsheet Row Most Recent Value  SDOH Interventions   Transportation Interventions Intervention Not Indicated  Financial Strain Interventions Intervention Not Indicated        Care Coordination Interventions:  Yes, provided  Interventions Today    Flowsheet Row Most Recent Value  Chronic Disease   Chronic disease during today's visit Diabetes  General Interventions   General Interventions Discussed/Reviewed General Interventions Discussed, General Interventions Reviewed, Durable Medical Equipment (DME), Doctor Visits  Doctor Visits Discussed/Reviewed Doctor Visits Discussed, PCP, Doctor  Visits Reviewed  Annabell Sabal [facility] EMR) and discussed recent PCP visit with Dr Sherwood Gambler on 02/14/23]  Durable Medical Equipment (DME) Glucomoter  [Does not check blood sugar routinely]  PCP/Specialist Visits Compliance with follow-up visit  Exercise Interventions   Exercise Discussed/Reviewed Physical Activity, Exercise Discussed, Exercise Reviewed  Physical Activity Discussed/Reviewed Physical Activity Discussed, Physical Activity Reviewed  Education Interventions   Education Provided Provided Printed Education, Provided Education  Provided Verbal Education On Nutrition, Blood Sugar Monitoring, When to see the doctor, Labs  [Printed education on carb counting and portion sizes. 3 meals per day with 30 g of carbs and up to 2 snack with <15 g of carbs. Check and record blood sugar at least once a day]  Labs Reviewed Hgb A1c  [02/14/23 A1C 8.7]  Nutrition Interventions   Nutrition Discussed/Reviewed Nutrition Discussed, Nutrition Reviewed, Carbohydrate meal planning, Portion sizes, Decreasing sugar intake, Increasing proteins, Fluid intake  Pharmacy Interventions   Pharmacy Dicussed/Reviewed Pharmacy Topics Discussed, Pharmacy Topics Reviewed, Medications and their functions  [Metformin and amaryl for blood sugar. PCP recommended alpha-lipoic acid OTC as well]       Follow up plan: Follow up call scheduled for 03/23/23    Encounter Outcome:  Pt. Visit Completed   Demetrios Loll, BSN, RN-BC RN Care Coordinator [facility]  [facility] Direct Dial: [phone] Main #: [phone]

## 2023-02-22 ENCOUNTER — Encounter: Payer: Self-pay | Admitting: *Deleted

## 2023-03-02 ENCOUNTER — Other Ambulatory Visit: Payer: Self-pay | Admitting: Cardiology

## 2023-03-07 DIAGNOSIS — R3912 Poor urinary stream: Secondary | ICD-10-CM | POA: Diagnosis not present

## 2023-03-07 DIAGNOSIS — R972 Elevated prostate specific antigen [PSA]: Secondary | ICD-10-CM | POA: Diagnosis not present

## 2023-03-07 DIAGNOSIS — N403 Nodular prostate with lower urinary tract symptoms: Secondary | ICD-10-CM | POA: Diagnosis not present

## 2023-03-23 ENCOUNTER — Encounter: Payer: Self-pay | Admitting: *Deleted

## 2023-03-24 ENCOUNTER — Other Ambulatory Visit: Payer: Self-pay | Admitting: Cardiology

## 2023-03-29 ENCOUNTER — Ambulatory Visit: Payer: Self-pay | Admitting: *Deleted

## 2023-03-29 ENCOUNTER — Encounter: Payer: Self-pay | Admitting: *Deleted

## 2023-03-29 NOTE — Patient Outreach (Signed)
Care Coordination   Follow Up Visit Note   03/29/2023 Name: Eric Ball MRN: [id number] DOB: 1950-11-24  Eric Ball is a 72 y.o. year old male who sees Elfredia Nevins, MD for primary care. I spoke with  Windy Canny by phone today.  What matters to the patients health and wellness today?  Following up with cardiologist regarding increase in shortness of breath and fatigue with exertion.    Goals Addressed             This Visit's Progress    Manage Diabetes   Not on track    Care Coordination Goals: Patient will check and record blood sugar daily and as needed with glucometer, and will call PCP with any readings outside of recommended range Patient will take blood sugar log and meter to provider visits for review Patient will eat 3 meals a day with 30 grams of carbohydrates and up to 2 snacks per day if needed with less than 15 grams of carbohydrates Patient will reference handouts provided on carbohydrate counting and portion sizes Patient will reach out to RN Care Coordinator [phone] with any care coordination or resource needs      Manage Fatigue   On track    Care Coordination Goals: Patient will keep appointment with cardiologist on 04/06/23 Patient will seek emergency medical attention if needed Patient will reach out to RN Care Coordinator with any resource or care coordination needs [phone]         SDOH assessments and interventions completed:  Yes  SDOH Interventions Today    Flowsheet Row Most Recent Value  SDOH Interventions   Transportation Interventions Intervention Not Indicated  Physical Activity Interventions Patient Declined        Care Coordination Interventions:  Yes, provided  Interventions Today    Flowsheet Row Most Recent Value  Chronic Disease   Chronic disease during today's visit Diabetes, Other  [CAD]  General Interventions   General Interventions Discussed/Reviewed General Interventions Discussed, General  Interventions Reviewed, Labs, Durable Medical Equipment (DME), Doctor Visits  Labs Hgb A1c every 3 months  Doctor Visits Discussed/Reviewed Doctor Visits Discussed, Doctor Visits Reviewed, PCP, Specialist  Durable Medical Equipment (DME) Glucomoter  [Patient does not check his blood sugar regularly but states that when he does check it, it is around 130-140 fasting and can be over 200 in the evening sometimes depending on what he's eaten]  PCP/Specialist Visits Compliance with follow-up visit  [Follow-up with PCP in 2 months as planned. Keep appointment with cardiologist on 04/06/23]  Exercise Interventions   Exercise Discussed/Reviewed Exercise Discussed, Exercise Reviewed, Physical Activity  Physical Activity Discussed/Reviewed Physical Activity Discussed, Physical Activity Reviewed  [Able to perform ADLs. States room for improvement with exercise but is currently limited by increase in SOB and fatigue with exertion. Following up with cardiologist next week.]  Education Interventions   Education Provided Provided Education  Provided Verbal Education On Nutrition, Blood Sugar Monitoring, Exercise, Medication, When to see the doctor, Labs  Labs Reviewed Hgb A1c  [02/14/23 A1C 8.7%]  Nutrition Interventions   Nutrition Discussed/Reviewed Nutrition Discussed, Nutrition Reviewed, Carbohydrate meal planning, Portion sizes, Increasing proteins, Adding fruits and vegetables, Fluid intake  [Patient mainly eats meat, potatoes, and vegetables. Drinks mostly water. No soda, desserts, or bread. Encouraged to decrease starchy vegetables, like potatoes. Previously provided handout on carbohydrate counting and meal planning.]  Pharmacy Interventions   Pharmacy Dicussed/Reviewed Pharmacy Topics Discussed, Pharmacy Topics Reviewed, Medications and their functions  [taking medications as  prescribed]       Follow up plan: Follow up call scheduled for 04/17/23    Encounter Outcome:  Patient Visit Completed    Demetrios Loll, RN, BSN Care Management Coordinator [facility]  [facility] Direct Dial: [phone] Main #: [phone]

## 2023-03-30 NOTE — Progress Notes (Signed)
HPI: FU CAD; he has had PCI of his circumflex and right coronary artery. Echo December 2018 showed normal LV systolic function. Patient admitted with non-ST elevation myocardial infarction February 2021. He was found to have severe proximal LAD stenosis and was treated with drug-eluting stent. Previous stents in the circumflex and right coronary artery were patent. Ejection fraction 55%. Since last seen, patient describes progressive increased dyspnea on exertion with associated chest heaviness.  He states his symptoms are similar to those prior to his PCI in the past.  They are slowly worsening over time by his report.  He denies orthopnea, PND, pedal edema or syncope.  Current Outpatient Medications  Medication Sig Dispense Refill   acarbose (PRECOSE) 100 MG tablet Take 100 mg by mouth 3 (three) times daily.     ASPIRIN LOW DOSE 81 MG tablet TAKE 1 TABLET BY MOUTH EVERY DAY 90 tablet 3   azelastine (ASTELIN) 0.1 % nasal spray Place 2 sprays into both nostrils 2 (two) times daily as needed for allergies.      Azelastine-Fluticasone 137-50 MCG/ACT SUSP Place 1 spray into both nostrils 2 (two) times daily.     carvedilol (COREG) 12.5 MG tablet TAKE 1 TABLET BY MOUTH TWICE A DAY WITH FOOD 180 tablet 0   Cholecalciferol (VITAMIN D3 PO) Take 1 capsule by mouth daily.     ezetimibe (ZETIA) 10 MG tablet Take 1 tablet (10 mg total) by mouth daily. 90 tablet 3   finasteride (PROSCAR) 5 MG tablet Take 5 mg by mouth daily.     fluticasone (FLONASE) 50 MCG/ACT nasal spray Place 1 spray into both nostrils daily as needed for allergies.     gabapentin (NEURONTIN) 100 MG capsule Take 100 mg by mouth 3 (three) times daily.     glimepiride (AMARYL) 4 MG tablet Take 4 mg by mouth daily.     ketoconazole (NIZORAL) 2 % cream Apply 1 application topically daily as needed for irritation.     loperamide (IMODIUM) 2 MG capsule Take 2 mg by mouth as needed for diarrhea or loose stools.     metFORMIN (GLUCOPHAGE)  1000 MG tablet Take 1 tablet (1,000 mg total) by mouth 2 (two) times daily with a meal. Resume metformin on 09/12/19. 180 tablet 3   montelukast (SINGULAIR) 10 MG tablet Take 10 mg by mouth daily.     Multiple Vitamins-Minerals (ZINC PO) Take 1 tablet by mouth daily.     nitroGLYCERIN (NITROSTAT) 0.4 MG SL tablet Place 1 tablet (0.4 mg total) under the tongue every 5 (five) minutes as needed for chest pain. 25 tablet 3   omega-3 acid ethyl esters (LOVAZA) 1 g capsule Take by mouth daily.     Omega-3 Fatty Acids (FISH OIL) 1000 MG CAPS Take 1 capsule by mouth daily at 12 noon.     pantoprazole (PROTONIX) 40 MG tablet Take 1 tablet (40 mg total) by mouth daily. 90 tablet 3   QUERCETIN PO Take 1 tablet by mouth daily.     REPATHA SURECLICK 140 MG/ML SOAJ Inject 140 mg as directed every 14 (fourteen) days.     silodosin (RAPAFLO) 8 MG CAPS capsule Take 8 mg by mouth at bedtime.     valsartan (DIOVAN) 320 MG tablet TAKE 1 TABLET BY MOUTH EVERY DAY 90 tablet 3   Vitamin D, Ergocalciferol, (DRISDOL) 1.25 MG (50000 UNIT) CAPS capsule Take 50,000 Units by mouth once a week.     No current facility-administered medications for this visit.  Past Medical History:  Diagnosis Date   Complication of anesthesia    "I'm slow to come out"   Coronary artery disease 2015   PTCA with DES to Cx; PCI  BMS 2004; LHC 08/2019 DES to LAD   GERD (gastroesophageal reflux disease)    H/O hiatal hernia    History of myocardial infarction    with a stent placed at that time   Hypercholesterolemia    with intolerant ot many of the cholesterol medicaitons   Hypertension    essentai hypertension   Ischemic heart disease    Myocardial infarction (HCC) 2004   with a stent placed at that time   Nephrosis  1960's   Sleep apnea    "suppose to wear mask; I don't wear it enough" (11/15/2013)   Type II diabetes mellitus (HCC)     Past Surgical History:  Procedure Laterality Date   BACK SURGERY     COLONOSCOPY   10/07/2011   [name]:multiple colonic polyps removed/colonic diverticulosis, tubular adenoma   COLONOSCOPY N/A 12/08/2014   Procedure: COLONOSCOPY;  Surgeon: Corbin Ade, MD;  Location: [facility];  Service: Endoscopy;  Laterality: N/A;  930   CORONARY ANGIOPLASTY WITH STENT PLACEMENT  09/25/2002   EF-approximately 45% / stent placement to the mid arteriovenous circumflex with reduction of 99% narrowing to 0% with placement of a 3.0 x 16 mm Express II stent with improvement of TIMI grade 2 to TIMI grade 3 flow.   CORONARY ANGIOPLASTY WITH STENT PLACEMENT  11/15/2013   "1"   CORONARY STENT INTERVENTION N/A 07/03/2017   Procedure: CORONARY STENT INTERVENTION;  Surgeon: Yvonne Kendall, MD;  Location: [facility] INVASIVE CV LAB;  Service: Cardiovascular;  Laterality: N/A;   CORONARY STENT INTERVENTION N/A 09/09/2019   Procedure: CORONARY STENT INTERVENTION;  Surgeon: Tonny Bollman, MD;  Location[facility] INVASIVE CV LAB;  Service: Cardiovascular;  Laterality: N/A;   EYE SURGERY     LEFT HEART CATH AND CORONARY ANGIOGRAPHY N/A 09/09/2019   Procedure: LEFT HEART CATH AND CORONARY ANGIOGRAPHY;  Surgeon: Tonny Bollman, MD;  Location: [facility] INVASIVE CV LAB;  Service: Cardiovascular;  Laterality: N/A;   LEFT HEART CATHETERIZATION WITH CORONARY ANGIOGRAM N/A 11/15/2013   Procedure: LEFT HEART CATHETERIZATION WITH CORONARY ANGIOGRAM;  Surgeon: Kathleene Hazel, MD;  Location[facility] CATH LAB;  Service: Cardiovascular;  Laterality: N/A;   LUMBAR DISC SURGERY  1991   NASAL SEPTUM SURGERY  02/20/2001   Septal reconstruction and turbinate reduction   PROSTATE BIOPSY N/A 06/04/2020   Procedure: SATURATION BIOPSY TRANSRECTAL ULTRASONIC PROSTATE (TUBP);  Surgeon: Crist Fat, MD;  Location: [facility] ORS;  Service: Urology;  Laterality: N/A;   RIGHT/LEFT HEART CATH AND CORONARY ANGIOGRAPHY N/A 07/03/2017   Procedure: RIGHT/LEFT HEART CATH AND CORONARY ANGIOGRAPHY;  Surgeon: Yvonne Kendall, MD;  Location: [facility]  INVASIVE CV LAB;  Service: Cardiovascular;  Laterality: N/A;    Social History   Socioeconomic History   Marital status: Married    Spouse name: Not on file   Number of children: Not on file   Years of education: Not on file   Highest education level: Not on file  Occupational History   Not on file  Tobacco Use   Smoking status: Never   Smokeless tobacco: Never   Tobacco comments:    Never smoker  Vaping Use   Vaping status: Never Used  Substance and Sexual Activity   Alcohol use: No    Alcohol/week: 0.0 standard drinks of alcohol   Drug use:  No   Sexual activity: Yes  Other Topics Concern   Not on file  Social History Narrative   Not on file   Social Determinants of Health   Financial Resource Strain: Low Risk  (02/20/2023)   Overall Financial Resource Strain (CARDIA)    Difficulty of Paying Living Expenses: Not very hard  Food Insecurity: Not on file  Transportation Needs: No Transportation Needs (03/29/2023)   PRAPARE - Administrator, Civil Service (Medical): No    Lack of Transportation (Non-Medical): No  Physical Activity: Inactive (03/29/2023)   Exercise Vital Sign    Days of Exercise per Week: 0 days    Minutes of Exercise per Session: 0 min  Stress: Not on file  Social Connections: Not on file  Intimate Partner Violence: Not on file    Family History  Problem Relation Age of Onset   Coronary artery disease Father        with multiple angioplasties   Healthy Sister    Hypertension Other        runs in the family   Colon cancer Neg Hx     ROS: no fevers or chills, productive cough, hemoptysis, dysphasia, odynophagia, melena, hematochezia, dysuria, hematuria, rash, seizure activity, orthopnea, PND, pedal edema, claudication. Remaining systems are negative.  Physical Exam: Well-developed well-nourished in no acute distress.  Skin is warm and dry.  HEENT is normal.  Neck is supple.  Chest is clear to auscultation with normal expansion.   Cardiovascular exam is regular rate and rhythm.  Abdominal exam nontender or distended. No masses palpated. Extremities show no edema. neuro grossly intact  EKG Interpretation Date/Time:  Thursday April 06 2023 08:36:52 EDT Ventricular Rate:  67 PR Interval:  168 QRS Duration:  96 QT Interval:  386 QTC Calculation: 407 R Axis:   233  Text Interpretation: Normal sinus rhythm Confirmed by Olga Millers ([id number]) on 04/06/2023 8:38:23 AM    A/P  1 coronary artery disease-continue aspirin.  Intolerant to statins.  Patient is describing progressive dyspnea on exertion/chest heaviness.  He states his symptoms are similar to those prior to his PCI.  Given progressive nature of his symptoms I feel definitive evaluation is warranted.  We will plan to proceed with cardiac catheterization.  The risk and benefits including myocardial infarction, CVA and death discussed and he agrees to proceed.  Check hemoglobin and renal function.  2 hyperlipidemia-intolerant to statins.  Continue Praluent and zetia.  Check lipids and liver.  3 hypertension-blood pressure controlled.  Continue present medical regimen.  Check potassium and renal function.  Olga Millers, MD

## 2023-03-30 NOTE — H&P (View-Only) (Signed)
HPI: FU CAD; he has had PCI of his circumflex and right coronary artery. Echo December 2018 showed normal LV systolic function. Patient admitted with non-ST elevation myocardial infarction February 2021. He was found to have severe proximal LAD stenosis and was treated with drug-eluting stent. Previous stents in the circumflex and right coronary artery were patent. Ejection fraction 55%. Since last seen, patient describes progressive increased dyspnea on exertion with associated chest heaviness.  He states his symptoms are similar to those prior to his PCI in the past.  They are slowly worsening over time by his report.  He denies orthopnea, PND, pedal edema or syncope.  Current Outpatient Medications  Medication Sig Dispense Refill   acarbose (PRECOSE) 100 MG tablet Take 100 mg by mouth 3 (three) times daily.     ASPIRIN LOW DOSE 81 MG tablet TAKE 1 TABLET BY MOUTH EVERY DAY 90 tablet 3   azelastine (ASTELIN) 0.1 % nasal spray Place 2 sprays into both nostrils 2 (two) times daily as needed for allergies.      Azelastine-Fluticasone 137-50 MCG/ACT SUSP Place 1 spray into both nostrils 2 (two) times daily.     carvedilol (COREG) 12.5 MG tablet TAKE 1 TABLET BY MOUTH TWICE A DAY WITH FOOD 180 tablet 0   Cholecalciferol (VITAMIN D3 PO) Take 1 capsule by mouth daily.     ezetimibe (ZETIA) 10 MG tablet Take 1 tablet (10 mg total) by mouth daily. 90 tablet 3   finasteride (PROSCAR) 5 MG tablet Take 5 mg by mouth daily.     fluticasone (FLONASE) 50 MCG/ACT nasal spray Place 1 spray into both nostrils daily as needed for allergies.     gabapentin (NEURONTIN) 100 MG capsule Take 100 mg by mouth 3 (three) times daily.     glimepiride (AMARYL) 4 MG tablet Take 4 mg by mouth daily.     ketoconazole (NIZORAL) 2 % cream Apply 1 application topically daily as needed for irritation.     loperamide (IMODIUM) 2 MG capsule Take 2 mg by mouth as needed for diarrhea or loose stools.     metFORMIN (GLUCOPHAGE)  1000 MG tablet Take 1 tablet (1,000 mg total) by mouth 2 (two) times daily with a meal. Resume metformin on 09/12/19. 180 tablet 3   montelukast (SINGULAIR) 10 MG tablet Take 10 mg by mouth daily.     Multiple Vitamins-Minerals (ZINC PO) Take 1 tablet by mouth daily.     nitroGLYCERIN (NITROSTAT) 0.4 MG SL tablet Place 1 tablet (0.4 mg total) under the tongue every 5 (five) minutes as needed for chest pain. 25 tablet 3   omega-3 acid ethyl esters (LOVAZA) 1 g capsule Take by mouth daily.     Omega-3 Fatty Acids (FISH OIL) 1000 MG CAPS Take 1 capsule by mouth daily at 12 noon.     pantoprazole (PROTONIX) 40 MG tablet Take 1 tablet (40 mg total) by mouth daily. 90 tablet 3   QUERCETIN PO Take 1 tablet by mouth daily.     REPATHA SURECLICK 140 MG/ML SOAJ Inject 140 mg as directed every 14 (fourteen) days.     silodosin (RAPAFLO) 8 MG CAPS capsule Take 8 mg by mouth at bedtime.     valsartan (DIOVAN) 320 MG tablet TAKE 1 TABLET BY MOUTH EVERY DAY 90 tablet 3   Vitamin D, Ergocalciferol, (DRISDOL) 1.25 MG (50000 UNIT) CAPS capsule Take 50,000 Units by mouth once a week.     No current facility-administered medications for this visit.  Past Medical History:  Diagnosis Date   Complication of anesthesia    "I'm slow to come out"   Coronary artery disease 2015   PTCA with DES to Cx; PCI  BMS 2004; LHC 08/2019 DES to LAD   GERD (gastroesophageal reflux disease)    H/O hiatal hernia    History of myocardial infarction    with a stent placed at that time   Hypercholesterolemia    with intolerant ot many of the cholesterol medicaitons   Hypertension    essentai hypertension   Ischemic heart disease    Myocardial infarction (HCC) 2004   with a stent placed at that time   Nephrosis  1960's   Sleep apnea    "suppose to wear mask; I don't wear it enough" (11/15/2013)   Type II diabetes mellitus (HCC)     Past Surgical History:  Procedure Laterality Date   BACK SURGERY     COLONOSCOPY   10/07/2011   [name]:multiple colonic polyps removed/colonic diverticulosis, tubular adenoma   COLONOSCOPY N/A 12/08/2014   Procedure: COLONOSCOPY;  Surgeon: Corbin Ade, MD;  Location: [facility];  Service: Endoscopy;  Laterality: N/A;  930   CORONARY ANGIOPLASTY WITH STENT PLACEMENT  09/25/2002   EF-approximately 45% / stent placement to the mid arteriovenous circumflex with reduction of 99% narrowing to 0% with placement of a 3.0 x 16 mm Express II stent with improvement of TIMI grade 2 to TIMI grade 3 flow.   CORONARY ANGIOPLASTY WITH STENT PLACEMENT  11/15/2013   "1"   CORONARY STENT INTERVENTION N/A 07/03/2017   Procedure: CORONARY STENT INTERVENTION;  Surgeon: Yvonne Kendall, MD;  Location: [facility] INVASIVE CV LAB;  Service: Cardiovascular;  Laterality: N/A;   CORONARY STENT INTERVENTION N/A 09/09/2019   Procedure: CORONARY STENT INTERVENTION;  Surgeon: Tonny Bollman, MD;  Location: [facility] INVASIVE CV LAB;  Service: Cardiovascular;  Laterality: N/A;   EYE SURGERY     LEFT HEART CATH AND CORONARY ANGIOGRAPHY N/A 09/09/2019   Procedure: LEFT HEART CATH AND CORONARY ANGIOGRAPHY;  Surgeon: Tonny Bollman, MD;  Location: [facility] INVASIVE CV LAB;  Service: Cardiovascular;  Laterality: N/A;   LEFT HEART CATHETERIZATION WITH CORONARY ANGIOGRAM N/A 11/15/2013   Procedure: LEFT HEART CATHETERIZATION WITH CORONARY ANGIOGRAM;  Surgeon: Kathleene Hazel, MD;  Location: [facility] CATH LAB;  Service: Cardiovascular;  Laterality: N/A;   LUMBAR DISC SURGERY  1991   NASAL SEPTUM SURGERY  02/20/2001   Septal reconstruction and turbinate reduction   PROSTATE BIOPSY N/A 06/04/2020   Procedure: SATURATION BIOPSY TRANSRECTAL ULTRASONIC PROSTATE (TUBP);  Surgeon: Crist Fat, MD;  Location: [facility] ORS;  Service: Urology;  Laterality: N/A;   RIGHT/LEFT HEART CATH AND CORONARY ANGIOGRAPHY N/A 07/03/2017   Procedure: RIGHT/LEFT HEART CATH AND CORONARY ANGIOGRAPHY;  Surgeon: Yvonne Kendall, MD;  Location: [facility]  INVASIVE CV LAB;  Service: Cardiovascular;  Laterality: N/A;    Social History   Socioeconomic History   Marital status: Married    Spouse name: Not on file   Number of children: Not on file   Years of education: Not on file   Highest education level: Not on file  Occupational History   Not on file  Tobacco Use   Smoking status: Never   Smokeless tobacco: Never   Tobacco comments:    Never smoker  Vaping Use   Vaping status: Never Used  Substance and Sexual Activity   Alcohol use: No    Alcohol/week: 0.0 standard drinks of alcohol   Drug use:  No   Sexual activity: Yes  Other Topics Concern   Not on file  Social History Narrative   Not on file   Social Determinants of Health   Financial Resource Strain: Low Risk  (02/20/2023)   Overall Financial Resource Strain (CARDIA)    Difficulty of Paying Living Expenses: Not very hard  Food Insecurity: Not on file  Transportation Needs: No Transportation Needs (03/29/2023)   PRAPARE - Administrator, Civil Service (Medical): No    Lack of Transportation (Non-Medical): No  Physical Activity: Inactive (03/29/2023)   Exercise Vital Sign    Days of Exercise per Week: 0 days    Minutes of Exercise per Session: 0 min  Stress: Not on file  Social Connections: Not on file  Intimate Partner Violence: Not on file    Family History  Problem Relation Age of Onset   Coronary artery disease Father        with multiple angioplasties   Healthy Sister    Hypertension Other        runs in the family   Colon cancer Neg Hx     ROS: no fevers or chills, productive cough, hemoptysis, dysphasia, odynophagia, melena, hematochezia, dysuria, hematuria, rash, seizure activity, orthopnea, PND, pedal edema, claudication. Remaining systems are negative.  Physical Exam: Well-developed well-nourished in no acute distress.  Skin is warm and dry.  HEENT is normal.  Neck is supple.  Chest is clear to auscultation with normal expansion.   Cardiovascular exam is regular rate and rhythm.  Abdominal exam nontender or distended. No masses palpated. Extremities show no edema. neuro grossly intact  EKG Interpretation Date/Time:  Thursday April 06 2023 08:36:52 EDT Ventricular Rate:  67 PR Interval:  168 QRS Duration:  96 QT Interval:  386 QTC Calculation: 407 R Axis:   233  Text Interpretation: Normal sinus rhythm Confirmed by Olga Millers ([id number]) on 04/06/2023 8:38:23 AM    A/P  1 coronary artery disease-continue aspirin.  Intolerant to statins.  Patient is describing progressive dyspnea on exertion/chest heaviness.  He states his symptoms are similar to those prior to his PCI.  Given progressive nature of his symptoms I feel definitive evaluation is warranted.  We will plan to proceed with cardiac catheterization.  The risk and benefits including myocardial infarction, CVA and death discussed and he agrees to proceed.  Check hemoglobin and renal function.  2 hyperlipidemia-intolerant to statins.  Continue Praluent and zetia.  Check lipids and liver.  3 hypertension-blood pressure controlled.  Continue present medical regimen.  Check potassium and renal function.  Olga Millers, MD

## 2023-04-06 ENCOUNTER — Ambulatory Visit: Payer: Medicare HMO | Attending: Cardiology | Admitting: Cardiology

## 2023-04-06 ENCOUNTER — Other Ambulatory Visit: Payer: Self-pay | Admitting: *Deleted

## 2023-04-06 ENCOUNTER — Encounter: Payer: Self-pay | Admitting: Cardiology

## 2023-04-06 VITALS — BP 120/58 | HR 67 | Ht 70.0 in | Wt 175.8 lb

## 2023-04-06 DIAGNOSIS — I10 Essential (primary) hypertension: Secondary | ICD-10-CM

## 2023-04-06 DIAGNOSIS — I214 Non-ST elevation (NSTEMI) myocardial infarction: Secondary | ICD-10-CM

## 2023-04-06 DIAGNOSIS — I2583 Coronary atherosclerosis due to lipid rich plaque: Secondary | ICD-10-CM

## 2023-04-06 DIAGNOSIS — R072 Precordial pain: Secondary | ICD-10-CM

## 2023-04-06 DIAGNOSIS — I2510 Atherosclerotic heart disease of native coronary artery without angina pectoris: Secondary | ICD-10-CM | POA: Diagnosis not present

## 2023-04-06 NOTE — Patient Instructions (Signed)
   Testing/Procedures:      Cardiac Catheterization   You are scheduled for a Cardiac Catheterization on Tuesday, September 17 with Dr. Lance Muss.  1. Please arrive at the [facility] (Main Entrance A) at [facility]: [address] [place], [place] [address] at 7:00 AM (This time is 2 hour(s) before your procedure to ensure your preparation). Free valet parking service is available. You will check in at ADMITTING. The support person will be asked to wait in the waiting room.  It is OK to have someone drop you off and come back when you are ready to be discharged.        Special note: Every effort is made to have your procedure done on time. Please understand that emergencies sometimes delay scheduled procedures.  2. Diet: Do not eat solid foods after midnight.  You may have clear liquids until 5 AM the day of the procedure.  3. Labs: You will need to have blood drawn on today  4. Medication instructions in preparation for your procedure:  DO NOT TAKE GLIMEPIRIDE THE MORNING OF THE PROCEDURE  DO NOT TAKE METFORMIN THE MORNING OF THE PROCEDURE AND 2 DAYS FOLLOWING THE PROCEDURE-RESTART THE 3 RD DAY POST PROCEDURE   On the morning of your procedure, take Aspirin 81 mg and any morning medicines NOT listed above.  You may use sips of water.  5. Plan to go home the same day, you will only stay overnight if medically necessary. 6. You MUST have a responsible adult to drive you home. 7. An adult MUST be with you the first 24 hours after you arrive home. 8. Bring a current list of your medications, and the last time and date medication taken. 9. Bring ID and current insurance cards. 10.Please wear clothes that are easy to get on and off and wear slip-on shoes.  Thank you for allowing [place] to care for you!   -- [facility]    Follow-Up: At [facility], you and your health needs are our priority.  As part of our continuing mission  to provide you with exceptional heart care, we have created designated Provider Care Teams.  These Care Teams include your primary Cardiologist (physician) and Advanced Practice Providers (APPs -  Physician Assistants and Nurse Practitioners) who all work together to provide you with the care you need, when you need it.  We recommend signing up for the patient portal called "MyChart".  Sign up information is provided on this After Visit Summary.  MyChart is used to connect with patients for Virtual Visits (Telemedicine).  Patients are able to view lab/test results, encounter notes, upcoming appointments, etc.  Non-urgent messages can be sent to your provider as well.   To learn more about what you can do with MyChart, go to [email or web address].    Your next appointment:   3 month(s)  Provider:   Olga Millers, MD

## 2023-04-07 LAB — COMPREHENSIVE METABOLIC PANEL
ALT: 75 IU/L — ABNORMAL HIGH (ref 0–44)
AST: 46 IU/L — ABNORMAL HIGH (ref 0–40)
Albumin: 4.8 g/dL (ref 3.8–4.8)
Alkaline Phosphatase: 49 IU/L (ref 44–121)
BUN/Creatinine Ratio: 14 (ref 10–24)
BUN: 17 mg/dL (ref 8–27)
Bilirubin Total: 0.3 mg/dL (ref 0.0–1.2)
CO2: 21 mmol/L (ref 20–29)
Calcium: 10.1 mg/dL (ref 8.6–10.2)
Chloride: 100 mmol/L (ref 96–106)
Creatinine, Ser: 1.18 mg/dL (ref 0.76–1.27)
Globulin, Total: 2.4 g/dL (ref 1.5–4.5)
Glucose: 209 mg/dL — ABNORMAL HIGH (ref 70–99)
Potassium: 5.1 mmol/L (ref 3.5–5.2)
Sodium: 137 mmol/L (ref 134–144)
Total Protein: 7.2 g/dL (ref 6.0–8.5)
eGFR: 66 mL/min/{1.73_m2} (ref >59)

## 2023-04-07 LAB — CBC
Hematocrit: 41.4 % (ref 37.5–51.0)
Hemoglobin: 13.5 g/dL (ref 13.0–17.7)
MCH: 29.7 pg (ref 26.6–33.0)
MCHC: 32.6 g/dL (ref 31.5–35.7)
MCV: 91 fL (ref 79–97)
Platelets: 240 10*3/uL (ref 150–450)
RBC: 4.54 x10E6/uL (ref 4.14–5.80)
RDW: 12.6 % (ref 11.6–15.4)
WBC: 5.2 10*3/uL (ref 3.4–10.8)

## 2023-04-07 LAB — LIPID PANEL
Chol/HDL Ratio: 3.2 ratio (ref 0.0–5.0)
Cholesterol, Total: 146 mg/dL (ref 100–199)
HDL: 45 mg/dL (ref >39)
LDL Chol Calc (NIH): 62 mg/dL (ref 0–99)
Triglycerides: 245 mg/dL — ABNORMAL HIGH (ref 0–149)
VLDL Cholesterol Cal: 39 mg/dL (ref 5–40)

## 2023-04-10 ENCOUNTER — Telehealth: Payer: Self-pay | Admitting: *Deleted

## 2023-04-10 NOTE — Telephone Encounter (Signed)
Cardiac Catheterization scheduled at [facility] for: Tuesday April 11, 2023 9 AM Arrival time [facility] Main Entrance A at: 7 AM  Nothing to eat after midnight prior to procedure, clear liquids until 5 AM day of procedure.  Medication instructions: -Hold:  Metformin-day of procedure and 48 hours post procedure  Glimepiride-AM of procedure -Other usual morning medications can be taken with sips of water including aspirin 81 mg.  Plan to go home the same day, you will only stay overnight if medically necessary.  You must have responsible adult to drive you home.  Someone must be with you the first 24 hours after you arrive home.  Reviewed procedure instructions with patient.

## 2023-04-11 ENCOUNTER — Encounter (HOSPITAL_COMMUNITY)
Admission: RE | Disposition: A | Discharge status: Home / Self Care | Payer: Self-pay | Source: Home / Self Care | Attending: Interventional Cardiology

## 2023-04-11 ENCOUNTER — Ambulatory Visit (HOSPITAL_COMMUNITY)
Admission: RE | Admit: 2023-04-11 | Discharge: 2023-04-11 | Disposition: A | Discharge status: Home / Self Care | Payer: Medicare HMO | Attending: Interventional Cardiology | Admitting: Interventional Cardiology

## 2023-04-11 DIAGNOSIS — I252 Old myocardial infarction: Secondary | ICD-10-CM | POA: Insufficient documentation

## 2023-04-11 DIAGNOSIS — Z7982 Long term (current) use of aspirin: Secondary | ICD-10-CM | POA: Insufficient documentation

## 2023-04-11 DIAGNOSIS — E785 Hyperlipidemia, unspecified: Secondary | ICD-10-CM | POA: Diagnosis not present

## 2023-04-11 DIAGNOSIS — I214 Non-ST elevation (NSTEMI) myocardial infarction: Secondary | ICD-10-CM

## 2023-04-11 DIAGNOSIS — I10 Essential (primary) hypertension: Secondary | ICD-10-CM | POA: Diagnosis not present

## 2023-04-11 DIAGNOSIS — R072 Precordial pain: Secondary | ICD-10-CM

## 2023-04-11 DIAGNOSIS — Z79899 Other long term (current) drug therapy: Secondary | ICD-10-CM | POA: Diagnosis not present

## 2023-04-11 DIAGNOSIS — Z955 Presence of coronary angioplasty implant and graft: Secondary | ICD-10-CM | POA: Insufficient documentation

## 2023-04-11 DIAGNOSIS — I25118 Atherosclerotic heart disease of native coronary artery with other forms of angina pectoris: Secondary | ICD-10-CM | POA: Insufficient documentation

## 2023-04-11 HISTORY — PX: LEFT HEART CATH AND CORONARY ANGIOGRAPHY: CATH118249

## 2023-04-11 LAB — GLUCOSE, CAPILLARY: Glucose-Capillary: 228 mg/dL — ABNORMAL HIGH (ref 70–99)

## 2023-04-11 SURGERY — LEFT HEART CATH AND CORONARY ANGIOGRAPHY
Anesthesia: LOCAL

## 2023-04-11 MED ORDER — MIDAZOLAM HCL 2 MG/2ML IJ SOLN
INTRAMUSCULAR | Status: AC
  Filled 2023-04-11: qty 2

## 2023-04-11 MED ORDER — VERAPAMIL HCL 2.5 MG/ML IV SOLN
INTRAVENOUS | Status: DC | PRN
  Administered 2023-04-11: 10 mL via INTRA_ARTERIAL

## 2023-04-11 MED ORDER — MIDAZOLAM HCL 2 MG/2ML IJ SOLN
INTRAMUSCULAR | Status: DC | PRN
  Administered 2023-04-11: 2 mg via INTRAVENOUS
  Administered 2023-04-11: 1 mg via INTRAVENOUS

## 2023-04-11 MED ORDER — IOHEXOL 350 MG/ML SOLN
INTRAVENOUS | Status: DC | PRN
  Administered 2023-04-11: 50 mL

## 2023-04-11 MED ORDER — ASPIRIN 81 MG PO CHEW: 81.0000 mg | CHEWABLE_TABLET | ORAL | Status: DC

## 2023-04-11 MED ORDER — VERAPAMIL HCL 2.5 MG/ML IV SOLN
INTRAVENOUS | Status: AC
  Filled 2023-04-11: qty 2

## 2023-04-11 MED ORDER — ACETAMINOPHEN 325 MG PO TABS: 650.0000 mg | ORAL_TABLET | ORAL | Status: DC | PRN

## 2023-04-11 MED ORDER — HEPARIN SODIUM (PORCINE) 1000 UNIT/ML IJ SOLN
INTRAMUSCULAR | Status: AC
  Filled 2023-04-11: qty 10

## 2023-04-11 MED ORDER — FENTANYL CITRATE (PF) 100 MCG/2ML IJ SOLN
INTRAMUSCULAR | Status: DC | PRN
  Administered 2023-04-11 (×2): 25 ug via INTRAVENOUS

## 2023-04-11 MED ORDER — HEPARIN SODIUM (PORCINE) 1000 UNIT/ML IJ SOLN
INTRAMUSCULAR | Status: DC | PRN
  Administered 2023-04-11: 3000 [IU] via INTRAVENOUS

## 2023-04-11 MED ORDER — METFORMIN HCL 1000 MG PO TABS: 1000.0000 mg | ORAL_TABLET | Freq: Two times a day (BID) | ORAL | Status: AC

## 2023-04-11 MED ORDER — SODIUM CHLORIDE 0.9% FLUSH: 3.0000 mL | Freq: Two times a day (BID) | INTRAVENOUS | Status: DC

## 2023-04-11 MED ORDER — SODIUM CHLORIDE 0.9 % IV SOLN: INTRAVENOUS | Status: AC

## 2023-04-11 MED ORDER — SODIUM CHLORIDE 0.9 % WEIGHT BASED INFUSION: 1.0000 mL/kg/h | INTRAVENOUS | Status: DC

## 2023-04-11 MED ORDER — SODIUM CHLORIDE 0.9% FLUSH: 3.0000 mL | INTRAVENOUS | Status: DC | PRN

## 2023-04-11 MED ORDER — LIDOCAINE HCL (PF) 1 % IJ SOLN
INTRAMUSCULAR | Status: DC | PRN
  Administered 2023-04-11: 5 mL via INTRADERMAL

## 2023-04-11 MED ORDER — SODIUM CHLORIDE 0.9 % IV SOLN: 250.0000 mL | INTRAVENOUS | Status: DC | PRN

## 2023-04-11 MED ORDER — LIDOCAINE HCL (PF) 1 % IJ SOLN
INTRAMUSCULAR | Status: AC
  Filled 2023-04-11: qty 30

## 2023-04-11 MED ORDER — HEPARIN (PORCINE) IN NACL 1000-0.9 UT/500ML-% IV SOLN
INTRAVENOUS | Status: DC | PRN
  Administered 2023-04-11 (×2): 500 mL

## 2023-04-11 MED ORDER — SODIUM CHLORIDE 0.9 % WEIGHT BASED INFUSION
3.0000 mL/kg/h | INTRAVENOUS | Status: AC
  Administered 2023-04-11: 3 mL/kg/h via INTRAVENOUS

## 2023-04-11 MED ORDER — ONDANSETRON HCL 4 MG/2ML IJ SOLN: 4.0000 mg | Freq: Four times a day (QID) | INTRAMUSCULAR | Status: DC | PRN

## 2023-04-11 MED ORDER — HYDRALAZINE HCL 20 MG/ML IJ SOLN: 10.0000 mg | INTRAMUSCULAR | Status: DC | PRN

## 2023-04-11 MED ORDER — FENTANYL CITRATE (PF) 100 MCG/2ML IJ SOLN
INTRAMUSCULAR | Status: AC
  Filled 2023-04-11: qty 2

## 2023-04-11 MED ORDER — LABETALOL HCL 5 MG/ML IV SOLN: 10.0000 mg | INTRAVENOUS | Status: DC | PRN

## 2023-04-11 SURGICAL SUPPLY — 9 items
CATH 5FR JL3.5 JR4 ANG PIG MP (CATHETERS) IMPLANT
DEVICE RAD COMP TR BAND LRG (VASCULAR PRODUCTS) IMPLANT
GLIDESHEATH SLEND SS 6F .021 (SHEATH) IMPLANT
GUIDEWIRE INQWIRE 1.5J.035X260 (WIRE) IMPLANT
INQWIRE 1.5J .035X260CM (WIRE) ×1
PACK CARDIAC CATHETERIZATION (CUSTOM PROCEDURE TRAY) ×1 IMPLANT
SET ATX-X65L (MISCELLANEOUS) IMPLANT
SHEATH PROBE COVER 6X72 (BAG) IMPLANT
WIRE HI TORQ VERSACORE-J 145CM (WIRE) IMPLANT

## 2023-04-11 NOTE — Interval H&P Note (Signed)
Cath Lab Visit (complete for each Cath Lab visit)  Clinical Evaluation Leading to the Procedure:   ACS: No.  Non-ACS:    Anginal Classification: CCS III  Anti-ischemic medical therapy: Minimal Therapy (1 class of medications)  Non-Invasive Test Results: No non-invasive testing performed  Prior CABG: No previous CABG      History and Physical Interval Note:  04/11/2023 9:02 AM  Eric Ball  has presented today for surgery, with the diagnosis of chest pain.  The various methods of treatment have been discussed with the patient and family. After consideration of risks, benefits and other options for treatment, the patient has consented to  Procedure(s): LEFT HEART CATH AND CORONARY ANGIOGRAPHY (N/A) as a surgical intervention.  The patient's history has been reviewed, patient examined, no change in status, stable for surgery.  I have reviewed the patient's chart and labs.  Questions were answered to the patient's satisfaction.     Lance Muss

## 2023-04-12 ENCOUNTER — Encounter (HOSPITAL_COMMUNITY): Payer: Self-pay | Admitting: Interventional Cardiology

## 2023-04-17 ENCOUNTER — Ambulatory Visit: Payer: Self-pay | Admitting: *Deleted

## 2023-04-17 ENCOUNTER — Encounter: Payer: Self-pay | Admitting: *Deleted

## 2023-04-18 NOTE — Patient Outreach (Signed)
Care Coordination   Follow Up Visit Note   04/17/2023 Name: Eric Ball MRN: [id number] DOB: [birth year]  Eric Ball is a 72 y.o. year old male who sees Elfredia Nevins, MD for primary care. I spoke with  Windy Canny by phone today.  What matters to the patients health and wellness today?  Improving energy level    Goals Addressed             This Visit's Progress    Manage Diabetes   Not on track    Care Coordination Goals: Patient will check and record blood sugar daily and as needed with glucometer, and will call PCP with any readings outside of recommended range Patient will take blood sugar log and meter to provider visits for review Patient will have readings available to review with RN Care Management Coordinator during telephone follow-ups Patient will eat 3 meals a day with 30 grams of carbohydrates and up to 2 snacks per day if needed with less than 15 grams of carbohydrates Patient will reference handouts provided on carbohydrate counting and portion sizes Patient will reach out to RN Care Coordinator [phone] with any care coordination or resource needs      Manage Fatigue   Not on track    Care Coordination Goals: Patient will keep appointment with cardiologist on 07/20/23 Patient will follow-up with cardiologist regarding ongoing fatigue and what the next steps are Patient will seek emergency medical attention if needed Patient will reach out to RN Care Coordinator with any resource or care coordination needs [phone]         SDOH assessments and interventions completed:  Yes  SDOH Interventions Today    Flowsheet Row Most Recent Value  SDOH Interventions   Transportation Interventions Intervention Not Indicated  Physical Activity Interventions Other (Comments)  [outreach to cardiologist regarding fatigue and requested recommendations]        Care Coordination Interventions:  Yes, provided   Interventions Today    Flowsheet Row  Most Recent Value  Chronic Disease   Chronic disease during today's visit Other  [CAD]  General Interventions   General Interventions Discussed/Reviewed General Interventions Discussed, General Interventions Reviewed, Doctor Visits, Communication with  [Ongoing fatigue. Reviewed and discussed cardiac catheterization on 04/11/23 and results. Patient complains of fatigue with exertion which is similar to when he had a heart blockage. Catheterization showed no significant changes.]  Labs Hgb A1c every 3 months  Doctor Visits Discussed/Reviewed Doctor Visits Discussed, Doctor Visits Reviewed, Specialist, PCP  Durable Medical Equipment (DME) Glucomoter  [Has glucometer but does not check blood sugar regularly.]  PCP/Specialist Visits Compliance with follow-up visit  [Follow up with PCP in 05/2023 and cardiologist on 07/20/23 and sooner if needed.]  Communication with PCP/Specialists  [[facility] Re: fatigue and any recommendations they may have like physical therapy, cardiac rehab, etc. Do they think this is cardiac related?]  Exercise Interventions   Exercise Discussed/Reviewed Physical Activity, Exercise Reviewed, Exercise Discussed  Physical Activity Discussed/Reviewed Physical Activity Reviewed, Physical Activity Discussed  [Able to perform ADLs independently. Has fatigue with exertion. Would like to be more physically active without getting tired.]  Education Interventions   Education Provided Provided Education  Provided Verbal Education On When to see the doctor, Blood Sugar Monitoring, Labs, Medication, Mental Health/Coping with Illness, Exercise  [Elevated bood sugar can cause fatigue. Needs to check blood sugar regularly and monitor for elevation. Talk with PCP if blood sugar is persistently elevated.]  Labs Reviewed Hgb A1c  [  02/14/23 A1C 8.7%]  Mental Health Interventions   Mental Health Discussed/Reviewed Mental Health Discussed, Mental Health Reviewed  Nutrition Interventions    Nutrition Discussed/Reviewed Nutrition Discussed, Nutrition Reviewed, Carbohydrate meal planning, Portion sizes, Fluid intake, Increasing proteins, Adding fruits and vegetables, Decreasing sugar intake  [Patient mainly eats meat, potatoes, and vegetables. Drinks mostly water. No soda, desserts, or bread. Encouraged to decrease starchy vegetables, like potatoes. Previously provided handout on carbohydrate counting and meal planning.]  Pharmacy Interventions   Pharmacy Dicussed/Reviewed Pharmacy Topics Discussed, Pharmacy Topics Reviewed, Medications and their functions  [taking medication as prescribed. No questions or concerns.]  Safety Interventions   Safety Discussed/Reviewed Safety Discussed, Safety Reviewed, Fall Risk       Follow up plan: Follow up call scheduled for 05/10/23    Encounter Outcome:  Patient Visit Completed   Demetrios Loll, RN, BSN Care Management Coordinator [facility]  [facility] Direct Dial: [phone] Main #: [phone]

## 2023-04-19 ENCOUNTER — Other Ambulatory Visit: Payer: Self-pay | Admitting: Cardiology

## 2023-04-19 DIAGNOSIS — E7800 Pure hypercholesterolemia, unspecified: Secondary | ICD-10-CM

## 2023-04-19 DIAGNOSIS — I2510 Atherosclerotic heart disease of native coronary artery without angina pectoris: Secondary | ICD-10-CM

## 2023-05-10 ENCOUNTER — Ambulatory Visit: Payer: Self-pay | Admitting: *Deleted

## 2023-05-10 ENCOUNTER — Encounter: Payer: Self-pay | Admitting: *Deleted

## 2023-05-10 DIAGNOSIS — D2272 Melanocytic nevi of left lower limb, including hip: Secondary | ICD-10-CM | POA: Diagnosis not present

## 2023-05-10 DIAGNOSIS — D1801 Hemangioma of skin and subcutaneous tissue: Secondary | ICD-10-CM | POA: Diagnosis not present

## 2023-05-10 DIAGNOSIS — L812 Freckles: Secondary | ICD-10-CM | POA: Diagnosis not present

## 2023-05-10 DIAGNOSIS — L218 Other seborrheic dermatitis: Secondary | ICD-10-CM | POA: Diagnosis not present

## 2023-05-10 DIAGNOSIS — D2262 Melanocytic nevi of left upper limb, including shoulder: Secondary | ICD-10-CM | POA: Diagnosis not present

## 2023-05-10 DIAGNOSIS — L821 Other seborrheic keratosis: Secondary | ICD-10-CM | POA: Diagnosis not present

## 2023-05-10 DIAGNOSIS — D225 Melanocytic nevi of trunk: Secondary | ICD-10-CM | POA: Diagnosis not present

## 2023-05-10 NOTE — Patient Outreach (Signed)
  Care Coordination   Follow Up Visit Note   05/10/2023 Name: Eric Ball MRN: [id number] DOB: Oct 27, 1950  Eric Ball is a 72 y.o. year old male who sees Elfredia Nevins, MD for primary care. I spoke with  Windy Canny by phone today.  What matters to the patients health and wellness today?  Improving fatigue    Goals Addressed             This Visit's Progress    Manage Fatigue   Not on track    Care Coordination Goals: Patient will keep appointment with cardiologist on 07/20/23 Patient will keep appointment with PCP on 05/18/23 and will discuss fatigue since there were no changes on cardiac catheterization Patient to walk for exercise as tolerated and will rest as needed Patient will seek emergency medical attention if needed Patient will reach out to RN Care Coordinator with any resource or care coordination needs [phone]         SDOH assessments and interventions completed:  Yes  SDOH Interventions Today    Flowsheet Row Most Recent Value  SDOH Interventions   Transportation Interventions Intervention Not Indicated  Physical Activity Interventions Other (Comments)  [walking but not participating in moderate or strenous exercise. Encouraged to talk with PCP about fatigue.]      Care Coordination Interventions:  Yes, provided  Interventions Today    Flowsheet Row Most Recent Value  Chronic Disease   Chronic disease during today's visit Diabetes, Other  [CAD]  General Interventions   General Interventions Discussed/Reviewed Durable Medical Equipment (DME)  Labs Hgb A1c every 3 months  Doctor Visits Discussed/Reviewed Doctor Visits Discussed, Doctor Visits Reviewed, PCP, Specialist  Durable Medical Equipment (DME) Glucomoter  [blood sugar readings are stable. No readings less than 70]  PCP/Specialist Visits Compliance with follow-up visit  [PCP on 05/18/23 and cardiologist on 07/20/23. Talk with PCP about fatigue.]  Exercise Interventions    Exercise Discussed/Reviewed Exercise Discussed, Exercise Reviewed, Physical Activity  Physical Activity Discussed/Reviewed Physical Activity Discussed, Physical Activity Reviewed, Types of exercise  [continuing to walk regularly but has not engaged in any moderate to strenous activity due to fatigue associated with increased activity]  Education Interventions   Education Provided Provided Education  Provided Verbal Education On Blood Sugar Monitoring, When to see the doctor, Exercise, Medication  Nutrition Interventions   Nutrition Discussed/Reviewed Nutrition Discussed, Nutrition Reviewed, Portion sizes, Carbohydrate meal planning, Increasing proteins, Adding fruits and vegetables, Fluid intake, Decreasing sugar intake  [3 meals per day with 30 GM of CHO and up to 2 snacks per day, if needed, with less than 15 GM of CHO]  Pharmacy Interventions   Pharmacy Dicussed/Reviewed Pharmacy Topics Discussed, Pharmacy Topics Reviewed, Medications and their functions  [taking medications as prescribed]  Safety Interventions   Safety Discussed/Reviewed Safety Discussed, Safety Reviewed       Follow up plan: Follow up call scheduled for 06/01/23    Encounter Outcome:  Patient Visit Completed   Demetrios Loll, RN, BSN Care Management Coordinator [facility]  [facility] Direct Dial: [phone] Main #: [phone]

## 2023-05-17 ENCOUNTER — Telehealth: Payer: Self-pay | Admitting: Internal Medicine

## 2023-05-17 NOTE — Telephone Encounter (Signed)
Questionnaire in review

## 2023-05-18 DIAGNOSIS — N401 Enlarged prostate with lower urinary tract symptoms: Secondary | ICD-10-CM | POA: Diagnosis not present

## 2023-05-18 DIAGNOSIS — E559 Vitamin D deficiency, unspecified: Secondary | ICD-10-CM | POA: Diagnosis not present

## 2023-05-18 DIAGNOSIS — G720 Drug-induced myopathy: Secondary | ICD-10-CM | POA: Diagnosis not present

## 2023-05-18 DIAGNOSIS — E1165 Type 2 diabetes mellitus with hyperglycemia: Secondary | ICD-10-CM | POA: Diagnosis not present

## 2023-05-18 DIAGNOSIS — Z6826 Body mass index (BMI) 26.0-26.9, adult: Secondary | ICD-10-CM | POA: Diagnosis not present

## 2023-05-18 DIAGNOSIS — E663 Overweight: Secondary | ICD-10-CM | POA: Diagnosis not present

## 2023-05-18 DIAGNOSIS — E1140 Type 2 diabetes mellitus with diabetic neuropathy, unspecified: Secondary | ICD-10-CM | POA: Diagnosis not present

## 2023-05-18 DIAGNOSIS — I10 Essential (primary) hypertension: Secondary | ICD-10-CM | POA: Diagnosis not present

## 2023-05-18 DIAGNOSIS — N400 Enlarged prostate without lower urinary tract symptoms: Secondary | ICD-10-CM | POA: Diagnosis not present

## 2023-05-23 NOTE — Telephone Encounter (Signed)
Due to NSTEMI in 04/11/23 will need OV prior.

## 2023-05-26 NOTE — Progress Notes (Unsigned)
GI Office Note    Referring Provider: Elfredia Nevins, MD Primary Care Physician:  Elfredia Nevins, MD  Primary Gastroenterologist: Gerrit Friends.Rourk, MD   Chief Complaint   No chief complaint on file.  History of Present Illness   Eric Ball is a 72 y.o. male presenting today at the request of Elfredia Nevins, MD for office visit prior to scheduling colonoscopy.  History of multiple tubular adenomas removed in 2013.  Last colonoscopy May 2016: -Normal-appearing rectal mucosa -Few scattered pancolonic diverticula -1 diminutive polyp at the base of the cecum -Path - tubular adenoma  Patient recently had NSTEMI in September 2024. ***   Today:    Current Outpatient Medications  Medication Sig Dispense Refill   acarbose (PRECOSE) 100 MG tablet Take 100 mg by mouth 3 (three) times daily.     alfuzosin (UROXATRAL) 10 MG 24 hr tablet Take 10 mg by mouth daily with breakfast.     Alpha-Lipoic Acid 600 MG CAPS Take 600 mg by mouth in the morning.     ASPIRIN LOW DOSE 81 MG tablet TAKE 1 TABLET BY MOUTH EVERY DAY 90 tablet 3   carvedilol (COREG) 12.5 MG tablet TAKE 1 TABLET BY MOUTH TWICE A DAY WITH FOOD 180 tablet 0   ezetimibe (ZETIA) 10 MG tablet TAKE 1 TABLET BY MOUTH EVERY DAY 90 tablet 3   finasteride (PROSCAR) 5 MG tablet Take 5 mg by mouth at bedtime.     fluticasone (FLONASE) 50 MCG/ACT nasal spray Place 1 spray into both nostrils daily as needed for allergies.     gabapentin (NEURONTIN) 100 MG capsule Take 100 mg by mouth 3 (three) times daily.     glimepiride (AMARYL) 4 MG tablet Take 8 mg by mouth in the morning.     ketoconazole (NIZORAL) 2 % cream Apply 1 application  topically daily as needed for irritation (facial rash).     loperamide (IMODIUM) 2 MG capsule Take 2 mg by mouth as needed for diarrhea or loose stools.     metFORMIN (GLUCOPHAGE) 1000 MG tablet Take 1 tablet (1,000 mg total) by mouth 2 (two) times daily with a meal. Resume metformin on 09/12/19.      nitroGLYCERIN (NITROSTAT) 0.4 MG SL tablet Place 1 tablet (0.4 mg total) under the tongue every 5 (five) minutes as needed for chest pain. 25 tablet 3   Omega-3 Fatty Acids (FISH OIL) 1000 MG CAPS Take 1 capsule by mouth daily at 12 noon.     pantoprazole (PROTONIX) 40 MG tablet Take 1 tablet (40 mg total) by mouth daily. 90 tablet 3   REPATHA SURECLICK 140 MG/ML SOAJ Inject 140 mg as directed every 14 (fourteen) days.     silodosin (RAPAFLO) 8 MG CAPS capsule Take 8 mg by mouth at bedtime.     valsartan (DIOVAN) 320 MG tablet TAKE 1 TABLET BY MOUTH EVERY DAY 90 tablet 3   Vitamin D, Ergocalciferol, (DRISDOL) 1.25 MG (50000 UNIT) CAPS capsule Take 50,000 Units by mouth every Wednesday.     No current facility-administered medications for this visit.    Past Medical History:  Diagnosis Date   Complication of anesthesia    "I'm slow to come out"   Coronary artery disease 2015   PTCA with DES to Cx; PCI  BMS 2004; LHC 08/2019 DES to LAD   GERD (gastroesophageal reflux disease)    H/O hiatal hernia    History of myocardial infarction    with a stent placed at that  time   Hypercholesterolemia    with intolerant ot many of the cholesterol medicaitons   Hypertension    essentai hypertension   Ischemic heart disease    Myocardial infarction [facility]) 2004   with a stent placed at that time   Nephrosis  1960's   Sleep apnea    "suppose to wear mask; I don't wear it enough" (11/15/2013)   Type II diabetes mellitus (HCC)     Past Surgical History:  Procedure Laterality Date   BACK SURGERY     COLONOSCOPY  10/07/2011   [name]:multiple colonic polyps removed/colonic diverticulosis, tubular adenoma   COLONOSCOPY N/A 12/08/2014   Procedure: COLONOSCOPY;  Surgeon: Corbin Ade, MD;  [facility];  Service: Endoscopy;  Laterality: N/A;  930   CORONARY ANGIOPLASTY WITH STENT PLACEMENT  09/25/2002   EF-approximately 45% / stent placement to the mid arteriovenous circumflex with  reduction of 99% narrowing to 0% with placement of a 3.0 x 16 mm Express II stent with improvement of TIMI grade 2 to TIMI grade 3 flow.   CORONARY ANGIOPLASTY WITH STENT PLACEMENT  11/15/2013   "1"   CORONARY STENT INTERVENTION N/A 07/03/2017   Procedure: CORONARY STENT INTERVENTION;  Surgeon: Yvonne Kendall, MD;  Location: [facility] INVASIVE CV LAB;  Service: Cardiovascular;  Laterality: N/A;   CORONARY STENT INTERVENTION N/A 09/09/2019   Procedure: CORONARY STENT INTERVENTION;  Surgeon: Tonny Bollman, MD;  Location: [facility] INVASIVE CV LAB;  Service: Cardiovascular;  Laterality: N/A;   EYE SURGERY     LEFT HEART CATH AND CORONARY ANGIOGRAPHY N/A 09/09/2019   Procedure: LEFT HEART CATH AND CORONARY ANGIOGRAPHY;  Surgeon: Tonny Bollman, MD;  Location: [facility] INVASIVE CV LAB;  Service: Cardiovascular;  Laterality: N/A;   LEFT HEART CATH AND CORONARY ANGIOGRAPHY N/A 04/11/2023   Procedure: LEFT HEART CATH AND CORONARY ANGIOGRAPHY;  Surgeon: Corky Crafts, MD;  Location: [facility] INVASIVE CV LAB;  Service: Cardiovascular;  Laterality: N/A;   LEFT HEART CATHETERIZATION WITH CORONARY ANGIOGRAM N/A 11/15/2013   Procedure: LEFT HEART CATHETERIZATION WITH CORONARY ANGIOGRAM;  Surgeon: Kathleene Hazel, MD;  Location: [facility] CATH LAB;  Service: Cardiovascular;  Laterality: N/A;   LUMBAR DISC SURGERY  1991   NASAL SEPTUM SURGERY  02/20/2001   Septal reconstruction and turbinate reduction   PROSTATE BIOPSY N/A 06/04/2020   Procedure: SATURATION BIOPSY TRANSRECTAL ULTRASONIC PROSTATE (TUBP);  Surgeon: Crist Fat, MD;  Location: [facility] ORS;  Service: Urology;  Laterality: N/A;   RIGHT/LEFT HEART CATH AND CORONARY ANGIOGRAPHY N/A 07/03/2017   Procedure: RIGHT/LEFT HEART CATH AND CORONARY ANGIOGRAPHY;  Surgeon: Yvonne Kendall, MD;  Location: [facility] INVASIVE CV LAB;  Service: Cardiovascular;  Laterality: N/A;    Family History  Problem Relation Age of Onset   Coronary artery disease Father        with multiple  angioplasties   Healthy Sister    Hypertension Other        runs in the family   Colon cancer Neg Hx     Allergies as of 05/29/2023 - Review Complete 05/10/2023  Allergen Reaction Noted   Statins Other (See Comments) 12/22/2016    Social History   Socioeconomic History   Marital status: Married    Spouse name: Not on file   Number of children: Not on file   Years of education: Not on file   Highest education level: Not on file  Occupational History   Not on file  Tobacco Use   Smoking status: Never  Smokeless tobacco: Never   Tobacco comments:    Never smoker  Vaping Use   Vaping status: Never Used  Substance and Sexual Activity   Alcohol use: No    Alcohol/week: 0.0 standard drinks of alcohol   Drug use: No   Sexual activity: Yes  Other Topics Concern   Not on file  Social History Narrative   Not on file   Social Determinants of Health   Financial Resource Strain: Low Risk  (02/20/2023)   Overall Financial Resource Strain (CARDIA)    Difficulty of Paying Living Expenses: Not very hard  Food Insecurity: Not on file  Transportation Needs: No Transportation Needs (05/10/2023)   PRAPARE - Administrator, Civil Service (Medical): No    Lack of Transportation (Non-Medical): No  Physical Activity: Inactive (05/10/2023)   Exercise Vital Sign    Days of Exercise per Week: 0 days    Minutes of Exercise per Session: 0 min  Stress: Not on file  Social Connections: Not on file  Intimate Partner Violence: Not on file     Review of Systems   Gen: Denies any fever, chills, fatigue, weight loss, lack of appetite.  CV: Denies chest pain, heart palpitations, peripheral edema, syncope.  Resp: Denies shortness of breath at rest or with exertion. Denies wheezing or cough.  GI: see HPI GU : Denies urinary burning, urinary frequency, urinary hesitancy MS: Denies joint pain, muscle weakness, cramps, or limitation of movement.  Derm: Denies rash, itching, dry  skin Psych: Denies depression, anxiety, memory loss, and confusion Heme: Denies bruising, bleeding, and enlarged lymph nodes.   Physical Exam   There were no vitals taken for this visit.  General:   Alert and oriented. Pleasant and cooperative. Well-nourished and well-developed.  Head:  Normocephalic and atraumatic. Eyes:  Without icterus, sclera clear and conjunctiva pink.  Ears:  Normal auditory acuity. Mouth:  No deformity or lesions, oral mucosa pink.  Lungs:  Clear to auscultation bilaterally. No wheezes, rales, or rhonchi. No distress.  Heart:  S1, S2 present without murmurs appreciated.  Abdomen:  +BS, soft, non-tender and non-distended. No HSM noted. No guarding or rebound. No masses appreciated.  Rectal:  Deferred  Msk:  Symmetrical without gross deformities. Normal posture. Extremities:  Without edema. Neurologic:  Alert and  oriented x4;  grossly normal neurologically. Skin:  Intact without significant lesions or rashes. Psych:  Alert and cooperative. Normal mood and affect.   Assessment   Eric Ball is a 72 y.o. male with a history of *** presenting today with    History of adenomatous colon polyps:   PLAN   *** Proceed with colonoscopy with propofol by Dr. Jena Gauss in near future: the risks, benefits, and alternatives have been discussed with the patient in detail. The patient states understanding and desires to proceed. ASA 3     Brooke Bonito, MSN, FNP-BC, AGACNP-BC [facility]

## 2023-05-29 ENCOUNTER — Encounter: Payer: Self-pay | Admitting: Gastroenterology

## 2023-05-29 ENCOUNTER — Ambulatory Visit (INDEPENDENT_AMBULATORY_CARE_PROVIDER_SITE_OTHER): Payer: Medicare HMO | Admitting: Gastroenterology

## 2023-05-29 VITALS — BP 126/75 | HR 91 | Temp 98.2°F | Ht 69.0 in | Wt 182.8 lb

## 2023-05-29 DIAGNOSIS — I2583 Coronary atherosclerosis due to lipid rich plaque: Secondary | ICD-10-CM | POA: Diagnosis not present

## 2023-05-29 DIAGNOSIS — I2510 Atherosclerotic heart disease of native coronary artery without angina pectoris: Secondary | ICD-10-CM | POA: Diagnosis not present

## 2023-05-29 DIAGNOSIS — Z860100 Personal history of colon polyps, unspecified: Secondary | ICD-10-CM | POA: Diagnosis not present

## 2023-05-29 NOTE — Patient Instructions (Addendum)
We will get you scheduled for colonoscopy in the near future with Dr. Jena Gauss.  You will receive separate detailed written instructions regarding your prep.  We will reach out to you to get you scheduled after we receive clearance from cardiology.  It was a pleasure to meet you today!  I hope you have a wonderful Thanksgiving and Christmas!  I want to create trusting relationships with patients. If you receive a survey regarding your visit,  I greatly appreciate you taking time to fill this out on paper or through your MyChart. I value your feedback.  Brooke Bonito, MSN, FNP-BC, AGACNP-BC [facility]

## 2023-05-30 ENCOUNTER — Telehealth: Payer: Self-pay

## 2023-05-30 ENCOUNTER — Telehealth: Payer: Self-pay | Admitting: Gastroenterology

## 2023-05-30 NOTE — Telephone Encounter (Signed)
Spoke with Dr. Johnnette Litter with Anesthesia about patient's recent NSTEMI. He stated that since no stents placed this most recent episode that we can just get cardiac clearance. Since its been within 3 months of his NSTEMI he will be ASA 4. He does have an upcoming appointment with cardiology at the end of December and if they decide they would like to wait to give clearance until after his appointment then that is fine. I told the patient cardiology may want to wait and he is okay with this.   Brooke Bonito, MSN, APRN, FNP-BC, AGACNP-BC [facility]

## 2023-05-30 NOTE — Telephone Encounter (Signed)
   Name: Eric Ball  DOB: [birth year]  MRN: [id number]  Primary Cardiologist: Olga Millers, MD   Preoperative team, please contact this patient and set up a phone call appointment for further preoperative risk assessment. Please obtain consent and complete medication review. Thank you for your help.  I confirm that guidance regarding antiplatelet and oral anticoagulation therapy has been completed and, if necessary, noted below. None requested.   Patient underwent LHC showing patent stents and no target for PCI in September 2024. He does not have office follow up until the end of December.   I also confirmed the patient resides in the state of [place]. As per [facility] Medical Board telemedicine laws, the patient must reside in the state in which the provider is licensed.   Carlos Levering, NP 05/30/2023, 4:30 PM [facility]

## 2023-05-30 NOTE — Telephone Encounter (Signed)
  Request for patient to stop medication prior to procedure or is needing cleareance  05/30/23  AMEL GIANINO 04/01/1951  What type of surgery is being performed? COLONOSCOPY  When is surgery scheduled? TBD  What type of clearance is required (medical or pharmacy to hold medication or both? MEDICAL  Name of physician performing surgery?  Dr. Gavin Potters [facility] at [facility] Phone: [phone] Fax: [phone]  Anethesia type (none, local, MAC, general)? MAC

## 2023-05-30 NOTE — Telephone Encounter (Signed)
Preop televisit now scheduled. Med rec and consent done

## 2023-05-30 NOTE — Telephone Encounter (Signed)
  Patient Consent for Virtual Visit        Eric Ball has provided verbal consent on 05/30/2023 for a virtual visit (video or telephone).   CONSENT FOR VIRTUAL VISIT FOR:  Eric Ball  By participating in this virtual visit I agree to the following:  I hereby voluntarily request, consent and authorize [facility] and its employed or contracted physicians, physician assistants, nurse practitioners or other licensed health care professionals (the Practitioner), to provide me with telemedicine health care services (the "Services") as deemed necessary by the treating Practitioner. I acknowledge and consent to receive the Services by the Practitioner via telemedicine. I understand that the telemedicine visit will involve communicating with the Practitioner through live audiovisual communication technology and the disclosure of certain medical information by electronic transmission. I acknowledge that I have been given the opportunity to request an in-person assessment or other available alternative prior to the telemedicine visit and am voluntarily participating in the telemedicine visit.  I understand that I have the right to withhold or withdraw my consent to the use of telemedicine in the course of my care at any time, without affecting my right to future care or treatment, and that the Practitioner or I may terminate the telemedicine visit at any time. I understand that I have the right to inspect all information obtained and/or recorded in the course of the telemedicine visit and may receive copies of available information for a reasonable fee.  I understand that some of the potential risks of receiving the Services via telemedicine include:  Delay or interruption in medical evaluation due to technological equipment failure or disruption; Information transmitted may not be sufficient (e.g. poor resolution of images) to allow for appropriate medical decision making by the  Practitioner; and/or  In rare instances, security protocols could fail, causing a breach of personal health information.  Furthermore, I acknowledge that it is my responsibility to provide information about my medical history, conditions and care that is complete and accurate to the best of my ability. I acknowledge that Practitioner's advice, recommendations, and/or decision may be based on factors not within their control, such as incomplete or inaccurate data provided by me or distortions of diagnostic images or specimens that may result from electronic transmissions. I understand that the practice of medicine is not an exact science and that Practitioner makes no warranties or guarantees regarding treatment outcomes. I acknowledge that a copy of this consent can be made available to me via my patient portal [facility] MyChart), or I can request a printed copy by calling the office of [facility].    I understand that my insurance will be billed for this visit.   I have read or had this consent read to me. I understand the contents of this consent, which adequately explains the benefits and risks of the Services being provided via telemedicine.  I have been provided ample opportunity to ask questions regarding this consent and the Services and have had my questions answered to my satisfaction. I give my informed consent for the services to be provided through the use of telemedicine in my medical care

## 2023-06-01 ENCOUNTER — Ambulatory Visit: Payer: Self-pay | Admitting: *Deleted

## 2023-06-01 ENCOUNTER — Encounter: Payer: Self-pay | Admitting: Gastroenterology

## 2023-06-01 NOTE — Patient Outreach (Signed)
  Care Coordination   06/01/2023 Name: Eric Ball MRN: [id number] DOB: August 25, 1950   Care Coordination Outreach Attempts:  An unsuccessful telephone outreach was attempted for a scheduled appointment today.  Follow Up Plan:  No further outreach attempts will be made at this time. Patient's Primary Care office is not partnering with the [facility] for care management and will be providing Care Management Services themselves.  Encounter Outcome:  No Answer. Left HIPAA compliant VM.   Care Coordination Interventions:  No, not indicated    Demetrios Loll, RN, BSN Care Management Coordinator [facility]  [facility] Direct Dial: [phone] Main #: [id number]

## 2023-06-13 NOTE — Telephone Encounter (Signed)
I s/w the pt and informed him per the pre op APP and Dr. Luvenia Starch NP feel it is best to have the pt see Dr. Jens Som in Dec 2024 as planned. Will address the appt notes to reflect need pre op clearance as well.

## 2023-06-13 NOTE — Progress Notes (Deleted)
{Choose 1 Note Type (Telehealth Visit or Telephone Visit):[phone]}  Evaluation Performed:  Preoperative cardiovascular risk assessment _____________   Date:  06/13/2023   Patient ID:  Eric Ball, DOB Oct 12, 1950, MRN [id number] Patient Location:  Home Provider location:   Office  Primary Care Provider:  Elfredia Nevins, MD Primary Cardiologist:  Olga Millers, MD  Chief Complaint / Patient Profile   72 y.o. y/o male with a h/o *** who is pending *** and presents today for telephonic preoperative cardiovascular risk assessment.  History of Present Illness    Eric Ball is a 72 y.o. male who presents via audio/video conferencing for a telehealth visit today.  Pt was last seen in cardiology clinic on *** by ***.  At that time Eric Ball was doing well ***.  The patient is now pending procedure as outlined above. Since his last visit, he ***  Past Medical History    Past Medical History:  Diagnosis Date   Complication of anesthesia    "I'm slow to come out"   Coronary artery disease 2015   PTCA with DES to Cx; PCI  BMS 2004; LHC 08/2019 DES to LAD   GERD (gastroesophageal reflux disease)    H/O hiatal hernia    History of myocardial infarction    with a stent placed at that time   Hypercholesterolemia    with intolerant ot many of the cholesterol medicaitons   Hypertension    essentai hypertension   Ischemic heart disease    Myocardial infarction (HCC) 2004   with a stent placed at that time   Nephrosis  1960's   Sleep apnea    "suppose to wear mask; I don't wear it enough" (11/15/2013)   Type II diabetes mellitus (HCC)    Past Surgical History:  Procedure Laterality Date   BACK SURGERY     COLONOSCOPY  10/07/2011   [name]:multiple colonic polyps removed/colonic diverticulosis, tubular adenoma   COLONOSCOPY N/A 12/08/2014   Procedure: COLONOSCOPY;  Surgeon: Corbin Ade, MD;  [facility];  Service: Endoscopy;  Laterality: N/A;  930    CORONARY ANGIOPLASTY WITH STENT PLACEMENT  09/25/2002   EF-approximately 45% / stent placement to the mid arteriovenous circumflex with reduction of 99% narrowing to 0% with placement of a 3.0 x 16 mm Express II stent with improvement of TIMI grade 2 to TIMI grade 3 flow.   CORONARY ANGIOPLASTY WITH STENT PLACEMENT  11/15/2013   "1"   CORONARY STENT INTERVENTION N/A 07/03/2017   Procedure: CORONARY STENT INTERVENTION;  Surgeon: Yvonne Kendall, MD;  Location: [facility] INVASIVE CV LAB;  Service: Cardiovascular;  Laterality: N/A;   CORONARY STENT INTERVENTION N/A 09/09/2019   Procedure: CORONARY STENT INTERVENTION;  Surgeon: Tonny Bollman, MD;  Location: [facility] INVASIVE CV LAB;  Service: Cardiovascular;  Laterality: N/A;   EYE SURGERY     LEFT HEART CATH AND CORONARY ANGIOGRAPHY N/A 09/09/2019   Procedure: LEFT HEART CATH AND CORONARY ANGIOGRAPHY;  Surgeon: Tonny Bollman, MD;  Location: [facility] INVASIVE CV LAB;  Service: Cardiovascular;  Laterality: N/A;   LEFT HEART CATH AND CORONARY ANGIOGRAPHY N/A 04/11/2023   Procedure: LEFT HEART CATH AND CORONARY ANGIOGRAPHY;  Surgeon: Corky Crafts, MD;  Location: [facility] INVASIVE CV LAB;  Service: Cardiovascular;  Laterality: N/A;   LEFT HEART CATHETERIZATION WITH CORONARY ANGIOGRAM N/A 11/15/2013   Procedure: LEFT HEART CATHETERIZATION WITH CORONARY ANGIOGRAM;  Surgeon: Kathleene Hazel, MD;  Location: [facility] CATH LAB;  Service: Cardiovascular;  Laterality: N/A;  LUMBAR DISC SURGERY  1991   NASAL SEPTUM SURGERY  02/20/2001   Septal reconstruction and turbinate reduction   PROSTATE BIOPSY N/A 06/04/2020   Procedure: SATURATION BIOPSY TRANSRECTAL ULTRASONIC PROSTATE (TUBP);  Surgeon: Crist Fat, MD;  Location: [facility] OR[facility];  Service: Urology;  Laterality: N/A;   RIGHT/LEFT HEART CATH AND CORONARY ANGIOGRAPHY N/A 07/03/2017   Procedure: RIGHT/LEFT HEART CATH AND CORONARY ANGIOGRAPHY;  Surgeon: Yvonne Kendall, MD;  Location: [facility] INVASIVE CV LAB;  Service:  Cardiovascular;  Laterality: N/A;    Allergies  Allergies  Allergen Reactions   Statins Other (See Comments)    Joint pain     Home Medications    Prior to Admission medications   Medication Sig Start Date End Date Taking? Authorizing Provider  acarbose (PRECOSE) 100 MG tablet Take 100 mg by mouth 3 (three) times daily. 11/07/22   [provider]  alfuzosin (UROXATRAL) 10 MG 24 hr tablet Take 10 mg by mouth daily with breakfast.    [provider]  Alpha-Lipoic Acid 600 MG CAPS Take 600 mg by mouth in the morning.    [provider]  ASPIRIN LOW DOSE 81 MG tablet TAKE 1 TABLET BY MOUTH EVERY DAY 08/05/22   Lewayne Bunting, MD  carvedilol (COREG) 12.5 MG tablet TAKE 1 TABLET BY MOUTH TWICE A DAY WITH FOOD 03/03/23   Lewayne Bunting, MD  ezetimibe (ZETIA) 10 MG tablet TAKE 1 TABLET BY MOUTH EVERY DAY 04/19/23   Lewayne Bunting, MD  finasteride (PROSCAR) 5 MG tablet Take 5 mg by mouth at bedtime.    [provider]  fluticasone (FLONASE) 50 MCG/ACT nasal spray Place 1 spray into both nostrils daily as needed for allergies. 05/07/20   [provider]  gabapentin (NEURONTIN) 100 MG capsule Take 100 mg by mouth 3 (three) times daily.    [provider]  glimepiride (AMARYL) 4 MG tablet Take 8 mg by mouth in the morning. 03/20/22   [provider]  ketoconazole (NIZORAL) 2 % cream Apply 1 application  topically daily as needed for irritation (facial rash).    [provider]  loperamide (IMODIUM) 2 MG capsule Take 2 mg by mouth as needed for diarrhea or loose stools.    [provider]  metFORMIN (GLUCOPHAGE) 1000 MG tablet Take 1 tablet (1,000 mg total) by mouth 2 (two) times daily with a meal. Resume metformin on 09/12/19. 04/13/23   Corky Crafts, MD  nitroGLYCERIN (NITROSTAT) 0.4 MG SL tablet Place 1 tablet (0.4 mg total) under the tongue every 5 (five) minutes as needed for chest pain. 09/19/19   Ronney Asters, NP  Omega-3 Fatty Acids (FISH OIL) 1000 MG CAPS Take 1 capsule by mouth daily at 12 noon.    [provider]  pantoprazole (PROTONIX) 40 MG tablet Take 1 tablet (40 mg total) by mouth daily. 12/04/15   Lewayne Bunting, MD  REPATHA SURECLICK 140 MG/ML SOAJ Inject 140 mg as directed every 14 (fourteen) days. 09/01/22   [provider]  silodosin (RAPAFLO) 8 MG CAPS capsule Take 8 mg by mouth at bedtime.    [provider]  valsartan (DIOVAN) 320 MG tablet TAKE 1 TABLET BY MOUTH EVERY DAY 11/18/22   Lewayne Bunting, MD  Vitamin D, Ergocalciferol, (DRISDOL) 1.25 MG (50000 UNIT) CAPS capsule Take 50,000 Units by mouth every Wednesday. 02/22/23   [provider]    Physical Exam    Vital Signs:  Windy Canny  does not have vital signs available for review today.***  Given telephonic nature of communication, physical exam is limited. AAOx3. NAD. Normal affect.  Speech and respirations are unlabored.  Accessory Clinical Findings    None  Assessment & Plan    1.  Preoperative Cardiovascular Risk Assessment:  The patient was advised that if he develops new symptoms prior to surgery to contact our office to arrange for a follow-up visit, and he verbalized understanding.  (Reminder: Include SBE prophylaxis/Antiplatelet/Anticoag Instructions***)  A copy of this note will be routed to requesting surgeon.  Time:   Today, I have spent *** minutes with the patient with telehealth technology discussing medical history, symptoms, and management plan.     Rip Harbour, NP  06/13/2023, 1:39 PM

## 2023-06-14 ENCOUNTER — Ambulatory Visit: Payer: Medicare HMO

## 2023-06-14 NOTE — Telephone Encounter (Signed)
fyi

## 2023-07-14 NOTE — Progress Notes (Unsigned)
HPI: FU CAD; he has had PCI of his circumflex and right coronary artery. Echo December 2018 showed normal LV systolic function. Patient admitted with non-ST elevation myocardial infarction February 2021. He was found to have severe proximal LAD stenosis and was treated with drug-eluting stent. Previous stents in the circumflex and right coronary artery were patent. Ejection fraction 55%.  Repeat catheterization September 2024 showed 40% RCA, 60% first marginal, 40% second marginal and patent stents otherwise; ejection fraction 55 to 65%.  Since last seen, the patient denies any dyspnea on exertion, orthopnea, PND, pedal edema, palpitations, syncope or chest pain.   Current Outpatient Medications  Medication Sig Dispense Refill   acarbose (PRECOSE) 100 MG tablet Take 100 mg by mouth 3 (three) times daily.     alfuzosin (UROXATRAL) 10 MG 24 hr tablet Take 10 mg by mouth daily with breakfast.     Alpha-Lipoic Acid 600 MG CAPS Take 600 mg by mouth in the morning.     ASPIRIN LOW DOSE 81 MG tablet TAKE 1 TABLET BY MOUTH EVERY DAY 90 tablet 3   carvedilol (COREG) 12.5 MG tablet TAKE 1 TABLET BY MOUTH TWICE A DAY WITH FOOD 180 tablet 0   ezetimibe (ZETIA) 10 MG tablet TAKE 1 TABLET BY MOUTH EVERY DAY 90 tablet 3   finasteride (PROSCAR) 5 MG tablet Take 5 mg by mouth at bedtime.     fluticasone (FLONASE) 50 MCG/ACT nasal spray Place 1 spray into both nostrils daily as needed for allergies.     gabapentin (NEURONTIN) 100 MG capsule Take 100 mg by mouth 3 (three) times daily.     glimepiride (AMARYL) 4 MG tablet Take 8 mg by mouth in the morning.     ketoconazole (NIZORAL) 2 % cream Apply 1 application  topically daily as needed for irritation (facial rash).     loperamide (IMODIUM) 2 MG capsule Take 2 mg by mouth as needed for diarrhea or loose stools.     metFORMIN (GLUCOPHAGE) 1000 MG tablet Take 1 tablet (1,000 mg total) by mouth 2 (two) times daily with a meal. Resume metformin on 09/12/19.      nitroGLYCERIN (NITROSTAT) 0.4 MG SL tablet Place 1 tablet (0.4 mg total) under the tongue every 5 (five) minutes as needed for chest pain. 25 tablet 3   Omega-3 Fatty Acids (FISH OIL) 1000 MG CAPS Take 1 capsule by mouth daily at 12 noon.     pantoprazole (PROTONIX) 40 MG tablet Take 1 tablet (40 mg total) by mouth daily. 90 tablet 3   REPATHA SURECLICK 140 MG/ML SOAJ Inject 140 mg as directed every 14 (fourteen) days.     silodosin (RAPAFLO) 8 MG CAPS capsule Take 8 mg by mouth at bedtime.     valsartan (DIOVAN) 320 MG tablet TAKE 1 TABLET BY MOUTH EVERY DAY 90 tablet 3   Vitamin D, Ergocalciferol, (DRISDOL) 1.25 MG (50000 UNIT) CAPS capsule Take 50,000 Units by mouth every Wednesday.     No current facility-administered medications for this visit.     Past Medical History:  Diagnosis Date   Complication of anesthesia    "I'm slow to come out"   Coronary artery disease 2015   PTCA with DES to Cx; PCI  BMS 2004; LHC 08/2019 DES to LAD   GERD (gastroesophageal reflux disease)    H/O hiatal hernia    History of myocardial infarction    with a stent placed at that time   Hypercholesterolemia    with  intolerant ot many of the cholesterol medicaitons   Hypertension    essentai hypertension   Ischemic heart disease    Myocardial infarction [facility]) 2004   with a stent placed at that time   Nephrosis  1960's   Sleep apnea    "suppose to wear mask; I don't wear it enough" (11/15/2013)   Type II diabetes mellitus (HCC)     Past Surgical History:  Procedure Laterality Date   BACK SURGERY     COLONOSCOPY  10/07/2011   [name]:multiple colonic polyps removed/colonic diverticulosis, tubular adenoma   COLONOSCOPY N/A 12/08/2014   Procedure: COLONOSCOPY;  Surgeon: Corbin Ade, MD;  [facility];  Service: Endoscopy;  Laterality: N/A;  930   CORONARY ANGIOPLASTY WITH STENT PLACEMENT  09/25/2002   EF-approximately 45% / stent placement to the mid arteriovenous circumflex with  reduction of 99% narrowing to 0% with placement of a 3.0 x 16 mm Express II stent with improvement of TIMI grade 2 to TIMI grade 3 flow.   CORONARY ANGIOPLASTY WITH STENT PLACEMENT  11/15/2013   "1"   CORONARY STENT INTERVENTION N/A 07/03/2017   Procedure: CORONARY STENT INTERVENTION;  Surgeon: Yvonne Kendall, MD;  Location: [facility] INVASIVE CV LAB;  Service: Cardiovascular;  Laterality: N/A;   CORONARY STENT INTERVENTION N/A 09/09/2019   Procedure: CORONARY STENT INTERVENTION;  Surgeon: Tonny Bollman, MD;  Location: [facility] INVASIVE CV LAB;  Service: Cardiovascular;  Laterality: N/A;   EYE SURGERY     LEFT HEART CATH AND CORONARY ANGIOGRAPHY N/A 09/09/2019   Procedure: LEFT HEART CATH AND CORONARY ANGIOGRAPHY;  Surgeon: Tonny Bollman, MD;  Location: [facility] INVASIVE CV LAB;  Service: Cardiovascular;  Laterality: N/A;   LEFT HEART CATH AND CORONARY ANGIOGRAPHY N/A 04/11/2023   Procedure: LEFT HEART CATH AND CORONARY ANGIOGRAPHY;  Surgeon: Corky Crafts, MD;  Location: [facility] INVASIVE CV LAB;  Service: Cardiovascular;  Laterality: N/A;   LEFT HEART CATHETERIZATION WITH CORONARY ANGIOGRAM N/A 11/15/2013   Procedure: LEFT HEART CATHETERIZATION WITH CORONARY ANGIOGRAM;  Surgeon: Kathleene Hazel, MD;  Location: [facility] CATH LAB;  Service: Cardiovascular;  Laterality: N/A;   LUMBAR DISC SURGERY  1991   NASAL SEPTUM SURGERY  02/20/2001   Septal reconstruction and turbinate reduction   PROSTATE BIOPSY N/A 06/04/2020   Procedure: SATURATION BIOPSY TRANSRECTAL ULTRASONIC PROSTATE (TUBP);  Surgeon: Crist Fat, MD;  Location: [facility];  Service: Urology;  Laterality: N/A;   RIGHT/LEFT HEART CATH AND CORONARY ANGIOGRAPHY N/A 07/03/2017   Procedure: RIGHT/LEFT HEART CATH AND CORONARY ANGIOGRAPHY;  Surgeon: Yvonne Kendall, MD;  Location: [facility] INVASIVE CV LAB;  Service: Cardiovascular;  Laterality: N/A;    Social History   Socioeconomic History   Marital status: Married    Spouse name: Not on file   Number  of children: Not on file   Years of education: Not on file   Highest education level: Not on file  Occupational History   Not on file  Tobacco Use   Smoking status: Never   Smokeless tobacco: Never   Tobacco comments:    Never smoker  Vaping Use   Vaping status: Never Used  Substance and Sexual Activity   Alcohol use: No    Alcohol/week: 0.0 standard drinks of alcohol   Drug use: No   Sexual activity: Yes  Other Topics Concern   Not on file  Social History Narrative   Not on file   Social Drivers of Health   Financial Resource Strain: Low Risk  (02/20/2023)  Overall Financial Resource Strain (CARDIA)    Difficulty of Paying Living Expenses: Not very hard  Food Insecurity: Not on file  Transportation Needs: No Transportation Needs (05/10/2023)   PRAPARE - Administrator, Civil Service (Medical): No    Lack of Transportation (Non-Medical): No  Physical Activity: Inactive (05/10/2023)   Exercise Vital Sign    Days of Exercise per Week: 0 days    Minutes of Exercise per Session: 0 min  Stress: Not on file  Social Connections: Not on file  Intimate Partner Violence: Not on file    Family History  Problem Relation Age of Onset   Coronary artery disease Father        with multiple angioplasties   Healthy Sister    Hypertension Other        runs in the family   Colon cancer Neg Hx     ROS: no fevers or chills, productive cough, hemoptysis, dysphasia, odynophagia, melena, hematochezia, dysuria, hematuria, rash, seizure activity, orthopnea, PND, pedal edema, claudication. Remaining systems are negative.  Physical Exam: Well-developed well-nourished in no acute distress.  Skin is warm and dry.  HEENT is normal.  Neck is supple.  Chest is clear to auscultation with normal expansion.  Cardiovascular exam is regular rate and rhythm.  Abdominal exam nontender or distended. No masses palpated. Extremities show no edema. neuro grossly intact  ECG-April 06, 2023-normal sinus rhythm with no ST changes.  Personally reviewed  A/P  1 coronary artery disease-recent catheterization revealed patent stents.  Plan will be to continue medical therapy.  Continue aspirin and statin.  2 preoperative evaluation-patient is scheduled for colonoscopy in the near future and we were asked to evaluate prior to the procedure.  Given recent catheterization revealing nonobstructive coronary disease he may proceed without further evaluation.  3 hypertension-patient's blood pressure is controlled.  Continue present medications.  4 hyperlipidemia-intolerant to statins.  Continue present medications of repatha and Zetia.  Olga Millers, MD

## 2023-07-20 ENCOUNTER — Ambulatory Visit: Payer: Medicare HMO | Attending: Cardiology | Admitting: Cardiology

## 2023-07-20 ENCOUNTER — Encounter: Payer: Self-pay | Admitting: Cardiology

## 2023-07-20 VITALS — BP 124/70 | HR 79 | Ht 69.0 in | Wt 180.0 lb

## 2023-07-20 DIAGNOSIS — I10 Essential (primary) hypertension: Secondary | ICD-10-CM

## 2023-07-20 DIAGNOSIS — I2583 Coronary atherosclerosis due to lipid rich plaque: Secondary | ICD-10-CM

## 2023-07-20 DIAGNOSIS — I2510 Atherosclerotic heart disease of native coronary artery without angina pectoris: Secondary | ICD-10-CM | POA: Diagnosis not present

## 2023-07-20 DIAGNOSIS — E7800 Pure hypercholesterolemia, unspecified: Secondary | ICD-10-CM

## 2023-07-20 DIAGNOSIS — Z01810 Encounter for preprocedural cardiovascular examination: Secondary | ICD-10-CM | POA: Diagnosis not present

## 2023-07-20 NOTE — Patient Instructions (Signed)

## 2023-07-22 ENCOUNTER — Other Ambulatory Visit: Payer: Self-pay | Admitting: Cardiology

## 2023-07-23 ENCOUNTER — Other Ambulatory Visit: Payer: Self-pay | Admitting: Cardiology

## 2023-07-25 DIAGNOSIS — G720 Drug-induced myopathy: Secondary | ICD-10-CM | POA: Diagnosis not present

## 2023-07-25 DIAGNOSIS — E1140 Type 2 diabetes mellitus with diabetic neuropathy, unspecified: Secondary | ICD-10-CM | POA: Diagnosis not present

## 2023-07-25 DIAGNOSIS — T466X5S Adverse effect of antihyperlipidemic and antiarteriosclerotic drugs, sequela: Secondary | ICD-10-CM | POA: Diagnosis not present

## 2023-07-31 NOTE — Telephone Encounter (Signed)
 LMTCB for pt

## 2023-08-01 MED ORDER — PEG 3350-KCL-NA BICARB-NACL 420 G PO SOLR: 4000.0000 mL | Freq: Once | ORAL | 0 refills | Status: AC

## 2023-08-01 NOTE — Addendum Note (Signed)
 Addended by: Armstead Peaks on: 08/01/2023 08:45 AM   Modules accepted: Orders

## 2023-08-01 NOTE — Telephone Encounter (Signed)
 Spoke with pt. He has been scheduled for 2/19. Aware will send instructions to him, rx for prep to be sent to pharmacy, he has changed insurances and will upload into his mychart

## 2023-08-18 DIAGNOSIS — E1165 Type 2 diabetes mellitus with hyperglycemia: Secondary | ICD-10-CM | POA: Diagnosis not present

## 2023-08-18 DIAGNOSIS — G720 Drug-induced myopathy: Secondary | ICD-10-CM | POA: Diagnosis not present

## 2023-08-18 DIAGNOSIS — T466X5S Adverse effect of antihyperlipidemic and antiarteriosclerotic drugs, sequela: Secondary | ICD-10-CM | POA: Diagnosis not present

## 2023-08-18 DIAGNOSIS — I10 Essential (primary) hypertension: Secondary | ICD-10-CM | POA: Diagnosis not present

## 2023-08-18 DIAGNOSIS — E1140 Type 2 diabetes mellitus with diabetic neuropathy, unspecified: Secondary | ICD-10-CM | POA: Diagnosis not present

## 2023-08-25 DIAGNOSIS — I10 Essential (primary) hypertension: Secondary | ICD-10-CM | POA: Diagnosis not present

## 2023-08-25 DIAGNOSIS — G720 Drug-induced myopathy: Secondary | ICD-10-CM | POA: Diagnosis not present

## 2023-08-25 DIAGNOSIS — E1140 Type 2 diabetes mellitus with diabetic neuropathy, unspecified: Secondary | ICD-10-CM | POA: Diagnosis not present

## 2023-09-07 ENCOUNTER — Encounter (HOSPITAL_COMMUNITY): Payer: Self-pay

## 2023-09-07 ENCOUNTER — Other Ambulatory Visit: Payer: Self-pay

## 2023-09-07 NOTE — Pre-Procedure Instructions (Signed)
Patient's PAT was completed with patient over the phone.

## 2023-09-08 ENCOUNTER — Encounter (HOSPITAL_COMMUNITY)
Admission: RE | Admit: 2023-09-08 | Discharge: 2023-09-08 | Disposition: A | Discharge status: Home / Self Care | Payer: Medicare Other | Source: Ambulatory Visit | Attending: Internal Medicine | Admitting: Internal Medicine

## 2023-09-10 ENCOUNTER — Other Ambulatory Visit: Payer: Self-pay | Admitting: Cardiology

## 2023-09-10 DIAGNOSIS — I10 Essential (primary) hypertension: Secondary | ICD-10-CM

## 2023-09-13 ENCOUNTER — Ambulatory Visit (HOSPITAL_COMMUNITY)
Admission: RE | Admit: 2023-09-13 | Discharge: 2023-09-13 | Disposition: A | Discharge status: Home / Self Care | Payer: Medicare Other | Attending: Internal Medicine | Admitting: Internal Medicine

## 2023-09-13 ENCOUNTER — Encounter (HOSPITAL_COMMUNITY): Payer: Self-pay | Admitting: Internal Medicine

## 2023-09-13 ENCOUNTER — Encounter (HOSPITAL_COMMUNITY)
Admission: RE | Disposition: A | Discharge status: Home / Self Care | Payer: Self-pay | Source: Home / Self Care | Attending: Internal Medicine

## 2023-09-13 ENCOUNTER — Ambulatory Visit (HOSPITAL_COMMUNITY): Payer: Medicare Other | Admitting: Anesthesiology

## 2023-09-13 ENCOUNTER — Other Ambulatory Visit: Payer: Self-pay

## 2023-09-13 DIAGNOSIS — Z1211 Encounter for screening for malignant neoplasm of colon: Secondary | ICD-10-CM

## 2023-09-13 DIAGNOSIS — I10 Essential (primary) hypertension: Secondary | ICD-10-CM | POA: Insufficient documentation

## 2023-09-13 DIAGNOSIS — Z955 Presence of coronary angioplasty implant and graft: Secondary | ICD-10-CM | POA: Diagnosis not present

## 2023-09-13 DIAGNOSIS — I252 Old myocardial infarction: Secondary | ICD-10-CM | POA: Diagnosis not present

## 2023-09-13 DIAGNOSIS — D122 Benign neoplasm of ascending colon: Secondary | ICD-10-CM

## 2023-09-13 DIAGNOSIS — I25119 Atherosclerotic heart disease of native coronary artery with unspecified angina pectoris: Secondary | ICD-10-CM

## 2023-09-13 DIAGNOSIS — I2510 Atherosclerotic heart disease of native coronary artery without angina pectoris: Secondary | ICD-10-CM | POA: Insufficient documentation

## 2023-09-13 DIAGNOSIS — K635 Polyp of colon: Secondary | ICD-10-CM | POA: Diagnosis not present

## 2023-09-13 DIAGNOSIS — Z860101 Personal history of adenomatous and serrated colon polyps: Secondary | ICD-10-CM | POA: Diagnosis not present

## 2023-09-13 DIAGNOSIS — D175 Benign lipomatous neoplasm of intra-abdominal organs: Secondary | ICD-10-CM | POA: Diagnosis not present

## 2023-09-13 DIAGNOSIS — G4730 Sleep apnea, unspecified: Secondary | ICD-10-CM | POA: Insufficient documentation

## 2023-09-13 DIAGNOSIS — Z860100 Personal history of colon polyps, unspecified: Secondary | ICD-10-CM

## 2023-09-13 DIAGNOSIS — K5730 Diverticulosis of large intestine without perforation or abscess without bleeding: Secondary | ICD-10-CM | POA: Diagnosis not present

## 2023-09-13 DIAGNOSIS — E119 Type 2 diabetes mellitus without complications: Secondary | ICD-10-CM | POA: Diagnosis not present

## 2023-09-13 DIAGNOSIS — D125 Benign neoplasm of sigmoid colon: Secondary | ICD-10-CM | POA: Insufficient documentation

## 2023-09-13 DIAGNOSIS — Z7984 Long term (current) use of oral hypoglycemic drugs: Secondary | ICD-10-CM | POA: Insufficient documentation

## 2023-09-13 DIAGNOSIS — K219 Gastro-esophageal reflux disease without esophagitis: Secondary | ICD-10-CM | POA: Diagnosis not present

## 2023-09-13 HISTORY — PX: COLONOSCOPY WITH PROPOFOL: SHX5780

## 2023-09-13 HISTORY — PX: POLYPECTOMY: SHX5525

## 2023-09-13 LAB — GLUCOSE, CAPILLARY: Glucose-Capillary: 168 mg/dL — ABNORMAL HIGH (ref 70–99)

## 2023-09-13 SURGERY — COLONOSCOPY WITH PROPOFOL
Anesthesia: General

## 2023-09-13 MED ORDER — LIDOCAINE HCL (PF) 2 % IJ SOLN
INTRAMUSCULAR | Status: DC | PRN
  Administered 2023-09-13: 50 mg via INTRADERMAL

## 2023-09-13 MED ORDER — LACTATED RINGERS IV SOLN: INTRAVENOUS | Status: DC

## 2023-09-13 MED ORDER — PROPOFOL 500 MG/50ML IV EMUL
INTRAVENOUS | Status: DC | PRN
  Administered 2023-09-13: 100 mg via INTRAVENOUS
  Administered 2023-09-13: 150 ug/kg/min via INTRAVENOUS

## 2023-09-13 NOTE — Op Note (Signed)
[facility] Patient Name: Eric Ball Procedure Date: 09/13/2023 6:59 AM MRN: [id number] Date of Birth: 06/27/1951 Attending MD: Gennette Pac , MD, [id number] CSN: [id number] Age: 73 Admit Type: Outpatient Procedure:                Colonoscopy Indications:              Screening for colorectal malignant neoplasm, High                            risk colon cancer surveillance: Personal history of                            colonic polyps Providers:                Gennette Pac, MD, Sheran Fava, Elinor Parkinson Referring MD:              Medicines:                Propofol per Anesthesia Complications:            No immediate complications. Estimated Blood Loss:     Estimated blood loss was minimal. Procedure:                Pre-Anesthesia Assessment:                           - Prior to the procedure, a History and Physical                            was performed, and patient medications and                            allergies were reviewed. The patient's tolerance of                            previous anesthesia was also reviewed. The risks                            and benefits of the procedure and the sedation                            options and risks were discussed with the patient.                            All questions were answered, and informed consent                            was obtained. Prior Anticoagulants: The patient has                            taken no anticoagulant or antiplatelet agents. ASA                            Grade Assessment:  II - A patient with mild systemic                            disease. After reviewing the risks and benefits,                            the patient was deemed in satisfactory condition to                            undergo the procedure.                           After obtaining informed consent, the colonoscope                            was passed under direct  vision. Throughout the                            procedure, the patient's blood pressure, pulse, and                            oxygen saturations were monitored continuously. The                            [id number]) scope was introduced through the                            anus and advanced to the the cecum, identified by                            appendiceal orifice and ileocecal valve. The                            colonoscopy was performed without difficulty. The                            patient tolerated the procedure well. The quality                            of the bowel preparation was adequate. The                            ileocecal valve, appendiceal orifice, and rectum                            were photographed. Scope In: 7:42:37 AM Scope Out: 7:57:11 AM Scope Withdrawal Time: 0 hours 12 minutes 56 seconds  Total Procedure Duration: 0 hours 14 minutes 34 seconds  Findings:      The perianal and digital rectal examinations were normal.      Eight sessile polyps were found in the sigmoid colon, splenic flexure       and ascending colon. The polyps were 3 to 8 mm in size. These polyps       were removed with a cold snare. Resection and retrieval were complete.  Estimated blood loss was minimal.      Scattered medium-mouthed diverticula were found in the sigmoid colon and       descending colon. 1 cm submucosal nodule descending segment. Positive       pillow sign.      The exam was otherwise without abnormality on direct and retroflexion       views. Impression:               - Eight 3 to 8 mm polyps in the sigmoid colon, at                            the splenic flexure and in the ascending colon,                            removed with a cold snare. Resected and retrieved.                           - Diverticulosis in the sigmoid colon and in the                            descending colon. The descending colon lipoma                           - The  examination was otherwise normal on direct                            and retroflexion views. Moderate Sedation:      Moderate (conscious) sedation was personally administered by an       anesthesia professional. The following parameters were monitored: oxygen       saturation, heart rate, blood pressure, respiratory rate, EKG, adequacy       of pulmonary ventilation, and response to care. Recommendation:           - Patient has a contact number available for                            emergencies. The signs and symptoms of potential                            delayed complications were discussed with the                            patient. Return to normal activities tomorrow.                            Written discharge instructions were provided to the                            patient.                           - Advance diet as tolerated.                           - Continue present medications.                           -  Repeat colonoscopy date to be determined after                            pending pathology results are reviewed for                            surveillance.                           - Return to GI office (date not yet determined). Procedure Code(s):        --- Professional ---                           [phone], Colonoscopy, flexible; with removal of                            tumor(s), polyp(s), or other lesion(s) by snare                            technique Diagnosis Code(s):        --- Professional ---                           Z12.11, Encounter for screening for malignant                            neoplasm of colon                           Z86.010, Personal history of colonic polyps                           D12.5, Benign neoplasm of sigmoid colon                           D12.3, Benign neoplasm of transverse colon (hepatic                            flexure or splenic flexure)                           D12.2, Benign neoplasm of ascending colon                            K57.30, Diverticulosis of large intestine without                            perforation or abscess without bleeding CPT copyright 2022 American [employer] Association. All rights reserved. The codes documented in this report are preliminary and upon coder review may  be revised to meet current compliance requirements. Gerrit Friends. Damen Windsor, MD Gennette Pac, MD 09/13/2023 8:21:15 AM This report has been signed electronically. Number of Addenda: 0

## 2023-09-13 NOTE — H&P (Signed)
@LOGO @   Primary Care Physician:  Elfredia Nevins, MD Primary Gastroenterologist:  Dr. Jena Gauss  Pre-Procedure History & Physical: HPI:  Eric Ball. is a 73 y.o. male here for surveillance colonoscopy.  History multiple colonic adenomas removed over time.  Non-STEMI last year seen by cardiology in December cleared for colonoscopy now  Past Medical History:  Diagnosis Date   Complication of anesthesia    "I'm slow to come out"   Coronary artery disease 2015   PTCA with DES to Cx; PCI  BMS 2004; LHC 08/2019 DES to LAD   GERD (gastroesophageal reflux disease)    H/O hiatal hernia    History of myocardial infarction    with a stent placed at that time   Hypercholesterolemia    with intolerant ot many of the cholesterol medicaitons   Hypertension    essentai hypertension   Ischemic heart disease    Myocardial infarction (HCC) 2004   with a stent placed at that time   Nephrosis  1960's   Sleep apnea    "suppose to wear mask; I don't wear it enough" (11/15/2013)   Type II diabetes mellitus (HCC)     Past Surgical History:  Procedure Laterality Date   BACK SURGERY     COLONOSCOPY  10/07/2011   Dr. Ixchel Duck:multiple colonic polyps removed/colonic diverticulosis, tubular adenoma   COLONOSCOPY N/A 12/08/2014   Procedure: COLONOSCOPY;  Surgeon: Corbin Ade, MD;  [facility]: [facility];  Service: Endoscopy;  Laterality: N/A;  930   CORONARY ANGIOPLASTY WITH STENT PLACEMENT  09/25/2002   EF-approximately 45% / stent placement to the mid arteriovenous circumflex with reduction of 99% narrowing to 0% with placement of a 3.0 x 16 mm Express II stent with improvement of TIMI grade 2 to TIMI grade 3 flow.   CORONARY ANGIOPLASTY WITH STENT PLACEMENT  11/15/2013   "1"   CORONARY STENT INTERVENTION N/A 07/03/2017   Procedure: CORONARY STENT INTERVENTION;  Surgeon: Yvonne Kendall, MD;  Location: [facility] INVASIVE CV LAB;  Service: Cardiovascular;  Laterality: N/A;   CORONARY STENT INTERVENTION N/A  09/09/2019   Procedure: CORONARY STENT INTERVENTION;  Surgeon: Tonny Bollman, MD;  Location: [facility] INVASIVE CV LAB;  Service: Cardiovascular;  Laterality: N/A;   EYE SURGERY     LEFT HEART CATH AND CORONARY ANGIOGRAPHY N/A 09/09/2019   Procedure: LEFT HEART CATH AND CORONARY ANGIOGRAPHY;  Surgeon: Tonny Bollman, MD;  Location: [facility] INVASIVE CV LAB;  Service: Cardiovascular;  Laterality: N/A;   LEFT HEART CATH AND CORONARY ANGIOGRAPHY N/A 04/11/2023   Procedure: LEFT HEART CATH AND CORONARY ANGIOGRAPHY;  Surgeon: Corky Crafts, MD;  Location: [facility] INVASIVE CV LAB;  Service: Cardiovascular;  Laterality: N/A;   LEFT HEART CATHETERIZATION WITH CORONARY ANGIOGRAM N/A 11/15/2013   Procedure: LEFT HEART CATHETERIZATION WITH CORONARY ANGIOGRAM;  Surgeon: Kathleene Hazel, MD;  Location: [facility] CATH LAB;  Service: Cardiovascular;  Laterality: N/A;   LUMBAR DISC SURGERY  1991   NASAL SEPTUM SURGERY  02/20/2001   Septal reconstruction and turbinate reduction   PROSTATE BIOPSY N/A 06/04/2020   Procedure: SATURATION BIOPSY TRANSRECTAL ULTRASONIC PROSTATE (TUBP);  Surgeon: Crist Fat, MD;  Location: [facility];  Service: Urology;  Laterality: N/A;   RIGHT/LEFT HEART CATH AND CORONARY ANGIOGRAPHY N/A 07/03/2017   Procedure: RIGHT/LEFT HEART CATH AND CORONARY ANGIOGRAPHY;  Surgeon: Yvonne Kendall, MD;  Location: [facility] INVASIVE CV LAB;  Service: Cardiovascular;  Laterality: N/A;    Prior to Admission medications   Medication Sig Start Date End Date  Taking? Authorizing Provider  acarbose (PRECOSE) 100 MG tablet Take 100 mg by mouth 3 (three) times daily. 11/07/22  Yes [provider]  ASPIRIN LOW DOSE 81 MG tablet TAKE 1 TABLET BY MOUTH EVERY DAY 07/24/23  Yes Lewayne Bunting, MD  carvedilol (COREG) 12.5 MG tablet TAKE 1 TABLET BY MOUTH TWICE A DAY WITH FOOD 07/24/23  Yes Lewayne Bunting, MD  ezetimibe (ZETIA) 10 MG tablet TAKE 1 TABLET BY MOUTH EVERY DAY 04/19/23  Yes Lewayne Bunting, MD   finasteride (PROSCAR) 5 MG tablet Take 5 mg by mouth at bedtime.   Yes [provider]  gabapentin (NEURONTIN) 100 MG capsule Take 100 mg by mouth 3 (three) times daily.   Yes [provider]  glimepiride (AMARYL) 4 MG tablet Take 8 mg by mouth in the morning. 03/20/22  Yes [provider]  metFORMIN (GLUCOPHAGE) 1000 MG tablet Take 1 tablet (1,000 mg total) by mouth 2 (two) times daily with a meal. Resume metformin on 09/12/19. 04/13/23  Yes Corky Crafts, MD  Omega-3 Fatty Acids (FISH OIL) 1000 MG CAPS Take 1 capsule by mouth daily at 12 noon.   Yes [provider]  pantoprazole (PROTONIX) 40 MG tablet Take 1 tablet (40 mg total) by mouth daily. 12/04/15  Yes Lewayne Bunting, MD  silodosin (RAPAFLO) 8 MG CAPS capsule Take 8 mg by mouth at bedtime.   Yes [provider]  valsartan (DIOVAN) 320 MG tablet TAKE 1 TABLET BY MOUTH EVERY DAY 09/12/23  Yes Crenshaw, Madolyn Frieze, MD  Vitamin D, Ergocalciferol, (DRISDOL) 1.25 MG (50000 UNIT) CAPS capsule Take 50,000 Units by mouth every Wednesday. 02/22/23  Yes [provider]  alfuzosin (UROXATRAL) 10 MG 24 hr tablet Take 10 mg by mouth daily with breakfast.    [provider]  Alpha-Lipoic Acid 600 MG CAPS Take 600 mg by mouth in the morning.    [provider]  empagliflozin (JARDIANCE) 10 MG TABS tablet Take 10 mg by mouth daily.    [provider]  fluticasone (FLONASE) 50 MCG/ACT nasal spray Place 1 spray into both nostrils daily as needed for allergies. 05/07/20   [provider]  ketoconazole (NIZORAL) 2 % cream Apply 1 application  topically daily as needed for irritation (facial rash).    [provider]  loperamide (IMODIUM) 2 MG capsule Take 2 mg by mouth as needed for diarrhea or loose stools.    [provider]  nitroGLYCERIN (NITROSTAT) 0.4 MG SL tablet Place 1 tablet (0.4 mg total) under the tongue every 5 (five) minutes as needed for  chest pain. 09/19/19   Ronney Asters, NP  REPATHA SURECLICK 140 MG/ML SOAJ Inject 140 mg as directed every 14 (fourteen) days. 09/01/22   [provider]    Allergies as of 08/01/2023 - Review Complete 07/20/2023  Allergen Reaction Noted   Statins Other (See Comments) 12/22/2016    Family History  Problem Relation Age of Onset   Coronary artery disease Father        with multiple angioplasties   Healthy Sister    Hypertension Other        runs in the family   Colon cancer Neg Hx     Social History   Socioeconomic History   Marital status: Married    Spouse name: Not on file   Number of children: Not on file   Years of education: Not on file   Highest education level: Not on file  Occupational History   Not on file  Tobacco Use   Smoking status: Never   Smokeless tobacco: Never   Tobacco comments:    Never smoker  Vaping Use   Vaping status: Never Used  Substance and Sexual Activity   Alcohol use: No    Alcohol/week: 0.0 standard drinks of alcohol   Drug use: No   Sexual activity: Yes  Other Topics Concern   Not on file  Social History Narrative   Not on file   Social Drivers of Health   Financial Resource Strain: Low Risk  (02/20/2023)   Overall Financial Resource Strain (CARDIA)    Difficulty of Paying Living Expenses: Not very hard  Food Insecurity: Not on file  Transportation Needs: No Transportation Needs (05/10/2023)   PRAPARE - Administrator, Civil Service (Medical): No    Lack of Transportation (Non-Medical): No  Physical Activity: Inactive (05/10/2023)   Exercise Vital Sign    Days of Exercise per Week: 0 days    Minutes of Exercise per Session: 0 min  Stress: Not on file  Social Connections: Not on file  Intimate Partner Violence: Not on file    Review of Systems: See HPI, otherwise negative ROS  Physical Exam: BP 136/75   Pulse 65   Temp 98.9 F (37.2 C) (Oral)   Resp 14   Ht 5\' 6"  (1.676 m)   Wt 81.6 kg   SpO2  99%   BMI 29.04 kg/m  General:   Alert,  Well-developed, well-nourished, pleasant and cooperative in NAD Neck:  Supple; no masses or thyromegaly. No significant cervical adenopathy. Lungs:  Clear throughout to auscultation.   No wheezes, crackles, or rhonchi. No acute distress. Heart:  Regular rate and rhythm; no murmurs, clicks, rubs,  or gallops. Abdomen: Non-distended, normal bowel sounds.  Soft and nontender without appreciable mass or hepatosplenomegaly.   Impression/Plan: 73 year old gentleman multiple colonic adenomas removed over multiple colonoscopies.  He is here for surveillance colonoscopy. The risks, benefits, limitations, alternatives and imponderables have been reviewed with the patient. Questions have been answered. All parties are agreeable.       Notice: This dictation was prepared with Dragon dictation along with smaller phrase technology. Any transcriptional errors that result from this process are unintentional and may not be corrected upon review.

## 2023-09-13 NOTE — Anesthesia Preprocedure Evaluation (Signed)
Anesthesia Evaluation  Patient identified by MRN, date of birth, ID band Patient awake    Reviewed: Allergy & Precautions, H&P , NPO status , Patient's Chart, lab work & pertinent test results, reviewed documented beta blocker date and time   History of Anesthesia Complications (+) history of anesthetic complications  Airway Mallampati: II  TM Distance: >3 FB Neck ROM: full    Dental no notable dental hx.    Pulmonary neg pulmonary ROS, sleep apnea    Pulmonary exam normal breath sounds clear to auscultation       Cardiovascular Exercise Tolerance: Good hypertension, + angina  + CAD and + Past MI  negative cardio ROS  Rhythm:regular Rate:Normal     Neuro/Psych  Neuromuscular disease negative neurological ROS  negative psych ROS   GI/Hepatic negative GI ROS, Neg liver ROS, hiatal hernia,GERD  ,,  Endo/Other  negative endocrine ROSdiabetes    Renal/GU Renal diseasenegative Renal ROS  negative genitourinary   Musculoskeletal   Abdominal   Peds  Hematology negative hematology ROS (+)   Anesthesia Other Findings   Reproductive/Obstetrics negative OB ROS                             Anesthesia Physical Anesthesia Plan  ASA: 3  Anesthesia Plan: General   Post-op Pain Management:    Induction:   PONV Risk Score and Plan: Propofol infusion  Airway Management Planned:   Additional Equipment:   Intra-op Plan:   Post-operative Plan:   Informed Consent: I have reviewed the patients History and Physical, chart, labs and discussed the procedure including the risks, benefits and alternatives for the proposed anesthesia with the patient or authorized representative who has indicated his/her understanding and acceptance.     Dental Advisory Given  Plan Discussed with: CRNA  Anesthesia Plan Comments:        Anesthesia Quick Evaluation

## 2023-09-13 NOTE — Transfer of Care (Signed)
Immediate Anesthesia Transfer of Care Note  Patient: Eric Ball.  Procedure(s) Performed: COLONOSCOPY WITH PROPOFOL POLYPECTOMY  Patient Location: Short Stay  Anesthesia Type:General  Level of Consciousness: drowsy  Airway & Oxygen Therapy: Patient Spontanous Breathing  Post-op Assessment: Report given to RN and Post -op Vital signs reviewed and stable  Post vital signs: Reviewed and stable  Last Vitals:  Vitals Value Taken Time  BP 106/62 09/13/23 0802  Temp 97.6   Pulse 66 09/13/23 0803  Resp 13 09/13/23 0803  SpO2 98 % 09/13/23 0803  Vitals shown include unfiled device data.  Last Pain:  Vitals:   09/13/23 0738  TempSrc:   PainSc: 0-No pain         Complications: No notable events documented.

## 2023-09-13 NOTE — Discharge Instructions (Signed)
  Colonoscopy Discharge Instructions  Read the instructions outlined below and refer to this sheet in the next few weeks. These discharge instructions provide you with general information on caring for yourself after you leave the hospital. Your doctor may also give you specific instructions. While your treatment has been planned according to the most current medical practices available, unavoidable complications occasionally occur. If you have any problems or questions after discharge, call Dr. Jena Gauss at [phone]. ACTIVITY You may resume your regular activity, but move at a slower pace for the next 24 hours.  Take frequent rest periods for the next 24 hours.  Walking will help get rid of the air and reduce the bloated feeling in your belly (abdomen).  No driving for 24 hours (because of the medicine (anesthesia) used during the test).   Do not sign any important legal documents or operate any machinery for 24 hours (because of the anesthesia used during the test).  NUTRITION Drink plenty of fluids.  You may resume your normal diet as instructed by your doctor.  Begin with a light meal and progress to your normal diet. Heavy or fried foods are harder to digest and may make you feel sick to your stomach (nauseated).  Avoid alcoholic beverages for 24 hours or as instructed.  MEDICATIONS You may resume your normal medications unless your doctor tells you otherwise.  WHAT YOU CAN EXPECT TODAY Some feelings of bloating in the abdomen.  Passage of more gas than usual.  Spotting of blood in your stool or on the toilet paper.  IF YOU HAD POLYPS REMOVED DURING THE COLONOSCOPY: No aspirin products for 7 days or as instructed.  No alcohol for 7 days or as instructed.  Eat a soft diet for the next 24 hours.  FINDING OUT THE RESULTS OF YOUR TEST Not all test results are available during your visit. If your test results are not back during the visit, make an appointment with your caregiver to find out the  results. Do not assume everything is normal if you have not heard from your caregiver or the medical facility. It is important for you to follow up on all of your test results.  SEEK IMMEDIATE MEDICAL ATTENTION IF: You have more than a spotting of blood in your stool.  Your belly is swollen (abdominal distention).  You are nauseated or vomiting.  You have a temperature over 101.  You have abdominal pain or discomfort that is severe or gets worse throughout the day.     8 small polyps removed from your colon today.  Colon polyp and diverticulosis information provided Further recommendations to follow pending review of pathology report  At patient request, I called Silverio Hagan at [phone] -reviewed findings and recommendations

## 2023-09-14 ENCOUNTER — Encounter: Payer: Self-pay | Admitting: Internal Medicine

## 2023-09-14 ENCOUNTER — Encounter (HOSPITAL_COMMUNITY): Payer: Self-pay | Admitting: Internal Medicine

## 2023-09-14 LAB — SURGICAL PATHOLOGY

## 2023-09-15 NOTE — Anesthesia Postprocedure Evaluation (Signed)
Anesthesia Post Note  Patient: Eric Ball.  Procedure(s) Performed: COLONOSCOPY WITH PROPOFOL POLYPECTOMY  Patient location during evaluation: Phase II Anesthesia Type: General Level of consciousness: awake Pain management: pain level controlled Vital Signs Assessment: post-procedure vital signs reviewed and stable Respiratory status: spontaneous breathing and respiratory function stable Cardiovascular status: blood pressure returned to baseline and stable Postop Assessment: no headache and no apparent nausea or vomiting Anesthetic complications: no Comments: Late entry   No notable events documented.   Last Vitals:  Vitals:   09/13/23 0802 09/13/23 0805  BP: 106/62 105/62  Pulse: 65 64  Resp: 13 19  Temp: 36.5 C   SpO2: 98% 99%    Last Pain:  Vitals:   09/14/23 1342  TempSrc:   PainSc: 0-No pain                 Windell Norfolk

## 2023-10-07 ENCOUNTER — Other Ambulatory Visit: Payer: Self-pay | Admitting: Cardiology

## 2023-11-10 ENCOUNTER — Other Ambulatory Visit: Payer: Self-pay

## 2023-11-10 DIAGNOSIS — I2510 Atherosclerotic heart disease of native coronary artery without angina pectoris: Secondary | ICD-10-CM

## 2023-11-10 MED ORDER — NITROGLYCERIN 0.4 MG SL SUBL: 0.4000 mg | SUBLINGUAL_TABLET | SUBLINGUAL | 2 refills | Status: DC | PRN

## 2023-11-21 DIAGNOSIS — E1165 Type 2 diabetes mellitus with hyperglycemia: Secondary | ICD-10-CM | POA: Diagnosis not present

## 2023-11-21 DIAGNOSIS — E1140 Type 2 diabetes mellitus with diabetic neuropathy, unspecified: Secondary | ICD-10-CM | POA: Diagnosis not present

## 2023-11-21 DIAGNOSIS — I10 Essential (primary) hypertension: Secondary | ICD-10-CM | POA: Diagnosis not present

## 2023-11-21 DIAGNOSIS — G720 Drug-induced myopathy: Secondary | ICD-10-CM | POA: Diagnosis not present

## 2023-11-27 ENCOUNTER — Other Ambulatory Visit: Payer: Self-pay | Admitting: Cardiology

## 2023-11-27 DIAGNOSIS — I2510 Atherosclerotic heart disease of native coronary artery without angina pectoris: Secondary | ICD-10-CM

## 2023-11-27 DIAGNOSIS — E7800 Pure hypercholesterolemia, unspecified: Secondary | ICD-10-CM

## 2024-02-05 ENCOUNTER — Other Ambulatory Visit: Payer: Self-pay | Admitting: Cardiology

## 2024-02-23 DIAGNOSIS — I10 Essential (primary) hypertension: Secondary | ICD-10-CM | POA: Diagnosis not present

## 2024-02-23 DIAGNOSIS — E1165 Type 2 diabetes mellitus with hyperglycemia: Secondary | ICD-10-CM | POA: Diagnosis not present

## 2024-02-23 DIAGNOSIS — E1140 Type 2 diabetes mellitus with diabetic neuropathy, unspecified: Secondary | ICD-10-CM | POA: Diagnosis not present

## 2024-03-20 ENCOUNTER — Other Ambulatory Visit: Payer: Self-pay | Admitting: Cardiology

## 2024-03-20 DIAGNOSIS — I2510 Atherosclerotic heart disease of native coronary artery without angina pectoris: Secondary | ICD-10-CM

## 2024-03-20 DIAGNOSIS — E7800 Pure hypercholesterolemia, unspecified: Secondary | ICD-10-CM

## 2024-03-24 DIAGNOSIS — G720 Drug-induced myopathy: Secondary | ICD-10-CM | POA: Diagnosis not present

## 2024-03-24 DIAGNOSIS — I2510 Atherosclerotic heart disease of native coronary artery without angina pectoris: Secondary | ICD-10-CM | POA: Diagnosis not present

## 2024-03-24 DIAGNOSIS — E1140 Type 2 diabetes mellitus with diabetic neuropathy, unspecified: Secondary | ICD-10-CM | POA: Diagnosis not present

## 2024-04-01 ENCOUNTER — Other Ambulatory Visit: Payer: Self-pay | Admitting: Cardiology

## 2024-04-01 DIAGNOSIS — I2510 Atherosclerotic heart disease of native coronary artery without angina pectoris: Secondary | ICD-10-CM

## 2024-04-16 ENCOUNTER — Other Ambulatory Visit: Payer: Self-pay | Admitting: Cardiology

## 2024-05-02 ENCOUNTER — Other Ambulatory Visit: Payer: Self-pay | Admitting: Cardiology

## 2024-05-30 ENCOUNTER — Other Ambulatory Visit: Payer: Self-pay | Admitting: Urology

## 2024-05-30 DIAGNOSIS — R972 Elevated prostate specific antigen [PSA]: Secondary | ICD-10-CM

## 2024-05-31 ENCOUNTER — Encounter: Payer: Self-pay | Admitting: Urology

## 2024-06-04 ENCOUNTER — Other Ambulatory Visit: Payer: Self-pay | Admitting: Cardiology

## 2024-06-04 DIAGNOSIS — I10 Essential (primary) hypertension: Secondary | ICD-10-CM

## 2024-07-31 ENCOUNTER — Other Ambulatory Visit: Payer: Self-pay | Admitting: Cardiology

## 2024-07-31 DIAGNOSIS — I2510 Atherosclerotic heart disease of native coronary artery without angina pectoris: Secondary | ICD-10-CM

## 2024-07-31 DIAGNOSIS — E7800 Pure hypercholesterolemia, unspecified: Secondary | ICD-10-CM

## 2024-08-22 ENCOUNTER — Encounter: Payer: Self-pay | Admitting: Internal Medicine

## 2024-08-27 ENCOUNTER — Other Ambulatory Visit: Payer: Self-pay | Admitting: Cardiology

## 2024-08-27 ENCOUNTER — Ambulatory Visit
Admission: RE | Admit: 2024-08-27 | Discharge: 2024-08-27 | Disposition: A | Source: Ambulatory Visit | Attending: Urology

## 2024-08-27 DIAGNOSIS — E7800 Pure hypercholesterolemia, unspecified: Secondary | ICD-10-CM

## 2024-08-27 DIAGNOSIS — R972 Elevated prostate specific antigen [PSA]: Secondary | ICD-10-CM

## 2024-08-27 DIAGNOSIS — I2510 Atherosclerotic heart disease of native coronary artery without angina pectoris: Secondary | ICD-10-CM

## 2024-08-27 MED ORDER — GADOPICLENOL 0.5 MMOL/ML IV SOLN
8.0000 mL | Freq: Once | INTRAVENOUS | Status: AC | PRN
  Administered 2024-08-27: 8 mL via INTRAVENOUS

## 2024-09-13 ENCOUNTER — Ambulatory Visit: Admitting: Internal Medicine
